# Patient Record
Sex: Female | Born: 1965 | Race: Black or African American | Hispanic: No | Marital: Married | Smoking: Never smoker
Health system: Southern US, Community
[De-identification: ages and names within clinical notes are randomized; demographics above are authoritative.]

## PROBLEM LIST (undated history)

## (undated) DIAGNOSIS — G8929 Other chronic pain: Secondary | ICD-10-CM

## (undated) DIAGNOSIS — J302 Other seasonal allergic rhinitis: Secondary | ICD-10-CM

## (undated) DIAGNOSIS — E119 Type 2 diabetes mellitus without complications: Secondary | ICD-10-CM

## (undated) DIAGNOSIS — M1990 Unspecified osteoarthritis, unspecified site: Secondary | ICD-10-CM

## (undated) DIAGNOSIS — K219 Gastro-esophageal reflux disease without esophagitis: Secondary | ICD-10-CM

## (undated) DIAGNOSIS — E785 Hyperlipidemia, unspecified: Secondary | ICD-10-CM

## (undated) DIAGNOSIS — F329 Major depressive disorder, single episode, unspecified: Secondary | ICD-10-CM

## (undated) DIAGNOSIS — E559 Vitamin D deficiency, unspecified: Secondary | ICD-10-CM

## (undated) DIAGNOSIS — D649 Anemia, unspecified: Secondary | ICD-10-CM

## (undated) DIAGNOSIS — F32A Depression, unspecified: Secondary | ICD-10-CM

## (undated) DIAGNOSIS — I10 Essential (primary) hypertension: Secondary | ICD-10-CM

## (undated) DIAGNOSIS — G629 Polyneuropathy, unspecified: Secondary | ICD-10-CM

## (undated) DIAGNOSIS — M549 Dorsalgia, unspecified: Secondary | ICD-10-CM

## (undated) DIAGNOSIS — Z973 Presence of spectacles and contact lenses: Secondary | ICD-10-CM

## (undated) HISTORY — PX: HERNIA REPAIR: SHX51

## (undated) HISTORY — DX: Polyneuropathy, unspecified: G62.9

## (undated) HISTORY — DX: Depression, unspecified: F32.A

## (undated) HISTORY — DX: Hyperlipidemia, unspecified: E78.5

## (undated) HISTORY — DX: Type 2 diabetes mellitus without complications: E11.9

## (undated) HISTORY — PX: DILATION AND CURETTAGE OF UTERUS: SHX78

## (undated) HISTORY — PX: BACK SURGERY: SHX140

## (undated) HISTORY — PX: TUBAL LIGATION: SHX77

## (undated) HISTORY — DX: Vitamin D deficiency, unspecified: E55.9

## (undated) HISTORY — DX: Essential (primary) hypertension: I10

---
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
## 2006-06-10 ENCOUNTER — Encounter: Admission: RE | Admit: 2006-06-10 | Discharge: 2006-06-10 | Payer: Self-pay | Admitting: Family Medicine

## 2010-10-23 ENCOUNTER — Encounter: Payer: Self-pay | Admitting: Nurse Practitioner

## 2010-10-23 DIAGNOSIS — E785 Hyperlipidemia, unspecified: Secondary | ICD-10-CM

## 2010-10-23 DIAGNOSIS — E559 Vitamin D deficiency, unspecified: Secondary | ICD-10-CM | POA: Insufficient documentation

## 2010-10-23 DIAGNOSIS — E1159 Type 2 diabetes mellitus with other circulatory complications: Secondary | ICD-10-CM | POA: Insufficient documentation

## 2010-10-23 DIAGNOSIS — M722 Plantar fascial fibromatosis: Secondary | ICD-10-CM | POA: Insufficient documentation

## 2010-10-23 DIAGNOSIS — I10 Essential (primary) hypertension: Secondary | ICD-10-CM | POA: Insufficient documentation

## 2010-10-23 DIAGNOSIS — E1169 Type 2 diabetes mellitus with other specified complication: Secondary | ICD-10-CM | POA: Insufficient documentation

## 2010-12-07 ENCOUNTER — Encounter: Payer: Self-pay | Admitting: Family Medicine

## 2012-11-03 ENCOUNTER — Telehealth: Payer: Self-pay | Admitting: Nurse Practitioner

## 2012-11-03 NOTE — Telephone Encounter (Signed)
APPT MADE

## 2012-11-12 ENCOUNTER — Encounter: Payer: Self-pay | Admitting: Nurse Practitioner

## 2012-11-12 ENCOUNTER — Ambulatory Visit (INDEPENDENT_AMBULATORY_CARE_PROVIDER_SITE_OTHER): Payer: PRIVATE HEALTH INSURANCE | Admitting: Nurse Practitioner

## 2012-11-12 VITALS — BP 118/80 | HR 82 | Temp 97.8°F | Ht 64.0 in | Wt 222.0 lb

## 2012-11-12 DIAGNOSIS — M79643 Pain in unspecified hand: Secondary | ICD-10-CM

## 2012-11-12 DIAGNOSIS — M25549 Pain in joints of unspecified hand: Secondary | ICD-10-CM

## 2012-11-12 DIAGNOSIS — N926 Irregular menstruation, unspecified: Secondary | ICD-10-CM

## 2012-11-12 DIAGNOSIS — D62 Acute posthemorrhagic anemia: Secondary | ICD-10-CM

## 2012-11-12 DIAGNOSIS — N939 Abnormal uterine and vaginal bleeding, unspecified: Secondary | ICD-10-CM

## 2012-11-12 MED ORDER — MEDROXYPROGESTERONE ACETATE 10 MG PO TABS: 10.0000 mg | ORAL_TABLET | Freq: Every day | ORAL | Status: DC

## 2012-11-12 NOTE — Progress Notes (Signed)
  Subjective:    Patient ID: Tammy Mayo, female    DOB: [birth year], 47 y.o.   MRN: [id number]  Gynecologic Exam The patient's primary symptoms include vaginal bleeding. Primary symptoms comment: menses for 3 weeks. This is a new problem. The current episode started more than 1 month ago. The problem has been unchanged. The pain is mild. She is not pregnant. Associated symptoms include back pain. The vaginal discharge was bloody. The vaginal bleeding is heavier than menses. She has been passing clots. She has not been passing tissue. Nothing aggravates the symptoms. She has tried nothing for the symptoms. She is sexually active. No, her partner does not have an STD. She uses nothing (Use to be on BCP stooped due to high blood pressure and risk) for contraception. Her menstrual history has been irregular (Patient has been bleeding dialy fro at least 3 weeks.).  Bil hand pain Started about 6 months ago. Hard to grip things. Can't open a drink bottle. Patient has tried "arthritis Meds" OTC which helps sometimes.    Review of Systems  Constitutional: Negative.   Eyes: Negative.   Respiratory: Negative.   Cardiovascular: Negative.   Gastrointestinal: Negative.   Musculoskeletal: Positive for back pain.  Psychiatric/Behavioral: Negative.        Objective:   Physical Exam  Constitutional: She is oriented to person, place, and time. She appears well-developed and well-nourished.  Cardiovascular: Normal rate, normal heart sounds and intact distal pulses.   Pulmonary/Chest: Effort normal and breath sounds normal.  Abdominal: Soft. Bowel sounds are normal. She exhibits no mass. There is no tenderness. There is no rebound and no guarding.  Musculoskeletal:  FROM bil hands with pain on tight grip bil. (-)phalen bil (-)tinel bil  Neurological: She is alert and oriented to person, place, and time.  Skin: Skin is warm and dry.   BP 118/80  Pulse 82  Temp(Src) 97.8 F (36.6 C) (Oral)  Ht 5'  4" (1.626 m)  Wt 222 lb (100.699 kg)  BMI 38.09 kg/m2  LMP 10/22/2012  Results for orders placed in visit on 11/12/12  POCT HEMOGLOBIN      Result Value Range   Hemoglobin 10.8 (*) 12.2 - 16.2 g/dL          Assessment & Plan:  Abnormal uterine bleeding  Provera as RX  Bleeding may increase at first but should get better  If still bleeding in 2 weeks will do GYN referral Anemia  Iron tablet OTC daily  Recheck in 1 weeks   Mary-Margaret Daphine Deutscher, FNP

## 2012-11-12 NOTE — Patient Instructions (Signed)

## 2012-11-13 LAB — ARTHRITIS PANEL
Sed Rate: 12 mm/hr (ref 0–22)
Uric Acid, Serum: 6 mg/dL (ref 2.4–6.0)

## 2013-01-06 ENCOUNTER — Telehealth: Payer: Self-pay | Admitting: Family Medicine

## 2013-01-06 NOTE — Telephone Encounter (Signed)
I am not sure who is seeing Tammy Mayo her mother this call should be forwarded to that provider.

## 2013-01-07 NOTE — Telephone Encounter (Signed)
This pt sees Dr Modesto Charon

## 2015-03-08 ENCOUNTER — Other Ambulatory Visit: Payer: Self-pay | Admitting: Orthopedic Surgery

## 2015-03-08 DIAGNOSIS — M5441 Lumbago with sciatica, right side: Secondary | ICD-10-CM

## 2015-03-13 ENCOUNTER — Ambulatory Visit
Admission: RE | Admit: 2015-03-13 | Discharge: 2015-03-13 | Disposition: A | Discharge status: Home / Self Care | Payer: Worker's Compensation | Source: Ambulatory Visit | Attending: Orthopedic Surgery | Admitting: Orthopedic Surgery

## 2015-03-13 DIAGNOSIS — M5441 Lumbago with sciatica, right side: Secondary | ICD-10-CM

## 2015-03-13 MED ORDER — IOHEXOL 180 MG/ML  SOLN
1.0000 mL | Freq: Once | INTRAMUSCULAR | Status: DC | PRN
  Administered 2015-03-13: 1 mL via EPIDURAL

## 2015-03-13 MED ORDER — METHYLPREDNISOLONE ACETATE 40 MG/ML INJ SUSP (RADIOLOG
120.0000 mg | Freq: Once | INTRAMUSCULAR | Status: AC
  Administered 2015-03-13: 120 mg via EPIDURAL

## 2015-03-13 NOTE — Discharge Instructions (Signed)

## 2015-05-24 ENCOUNTER — Telehealth: Payer: Self-pay | Admitting: Family Medicine

## 2015-05-29 NOTE — Telephone Encounter (Signed)
Records were sent 05-25-15 by [facility]. Called Mattie @ [phone] and told her they should be receiving something in the mail.

## 2015-05-30 HISTORY — PX: BACK SURGERY: SHX140

## 2015-06-09 ENCOUNTER — Ambulatory Visit: Payer: Self-pay | Admitting: Physician Assistant

## 2015-06-16 ENCOUNTER — Inpatient Hospital Stay (HOSPITAL_COMMUNITY): Admission: RE | Admit: 2015-06-16 | Payer: Self-pay | Source: Ambulatory Visit

## 2015-06-19 ENCOUNTER — Ambulatory Visit: Payer: Self-pay | Admitting: Physician Assistant

## 2015-06-19 MED ORDER — DEXTROSE 5 % IV SOLN
2.0000 g | INTRAVENOUS | Status: AC
  Administered 2015-06-21 (×2): 2 g via INTRAVENOUS

## 2015-06-20 ENCOUNTER — Encounter (HOSPITAL_COMMUNITY): Payer: Self-pay

## 2015-06-20 ENCOUNTER — Encounter (HOSPITAL_COMMUNITY)
Admission: RE | Admit: 2015-06-20 | Discharge: 2015-06-20 | Disposition: A | Discharge status: Home / Self Care | Payer: BLUE CROSS/BLUE SHIELD | Source: Ambulatory Visit | Attending: Orthopedic Surgery | Admitting: Orthopedic Surgery

## 2015-06-20 HISTORY — DX: Presence of spectacles and contact lenses: Z97.3

## 2015-06-20 HISTORY — DX: Other seasonal allergic rhinitis: J30.2

## 2015-06-20 HISTORY — DX: Other chronic pain: G89.29

## 2015-06-20 HISTORY — DX: Unspecified osteoarthritis, unspecified site: M19.90

## 2015-06-20 HISTORY — DX: Gastro-esophageal reflux disease without esophagitis: K21.9

## 2015-06-20 HISTORY — DX: Dorsalgia, unspecified: M54.9

## 2015-06-20 LAB — CBC
HCT: 41.5 % (ref 36.0–46.0)
Hemoglobin: 14.1 g/dL (ref 12.0–15.0)
MCH: 30.3 pg (ref 26.0–34.0)
MCHC: 34 g/dL (ref 30.0–36.0)
MCV: 89.1 fL (ref 78.0–100.0)
PLATELETS: 307 10*3/uL (ref 150–400)
RBC: 4.66 MIL/uL (ref 3.87–5.11)
RDW: 12.7 % (ref 11.5–15.5)
WBC: 7.1 10*3/uL (ref 4.0–10.5)

## 2015-06-20 LAB — BASIC METABOLIC PANEL
Anion gap: 8 (ref 5–15)
BUN: 10 mg/dL (ref 6–20)
CHLORIDE: 104 mmol/L (ref 101–111)
CO2: 26 mmol/L (ref 22–32)
CREATININE: 0.86 mg/dL (ref 0.44–1.00)
Calcium: 9.7 mg/dL (ref 8.9–10.3)
Glucose, Bld: 92 mg/dL (ref 65–99)
POTASSIUM: 3.6 mmol/L (ref 3.5–5.1)
SODIUM: 138 mmol/L (ref 135–145)

## 2015-06-20 LAB — SURGICAL PCR SCREEN
MRSA, PCR: NEGATIVE
Staphylococcus aureus: NEGATIVE

## 2015-06-20 LAB — HCG, SERUM, QUALITATIVE: Preg, Serum: NEGATIVE

## 2015-06-20 LAB — ABO/RH: ABO/RH(D): O NEG

## 2015-06-20 LAB — GLUCOSE, CAPILLARY: GLUCOSE-CAPILLARY: 128 mg/dL — AB (ref 65–99)

## 2015-06-20 MED ORDER — CEFAZOLIN SODIUM-DEXTROSE 2-3 GM-% IV SOLR
2.0000 g | INTRAVENOUS | Status: DC
  Filled 2015-06-20: qty 50

## 2015-06-20 NOTE — Progress Notes (Signed)
Pt denies SOB, chest pain, and being under the care of a cardiologist. Pt denies having a stress test, echo and cardiac cath. Pt denies having a chest x ray and EKG within the last year. Pt last labs were 04/06/15 at PCP, Dr. Samuel Jesterynthia Butler, DO. Revonda StandardAllison, [place], anesthesia, made aware of pre-op consult.

## 2015-06-20 NOTE — Progress Notes (Signed)
Anesthesia Note: Patient is a 49 year old female scheduled for TLIF L4-5 on 06/21/15 by Dr. Shon BatonBrooks.  History includes HTN, DM2, never smoker, HLD, umbilical hernia repair. BMI consistent with obesity. PCP is Dr. Charm BargesButler who signed a note of medical clearance recommending close monitoring of her blood sugars.   Meds include dapagliflozin-metformin, Prempro, HCTZ, lisinopril, Provera, Mobic, pyridoxine, Crestor, tramadol.   06/20/15 EKG: NSR, low voltage QRS, non-specific T wave abnormality. Q wave in III.  Baseline artifact worse in I, aVL, III, aVF. No EKG at Dr. Silvana NewnessButler's office. Denied any prior cardiac testing.   Preoperative labs noted. A1C pending. T&S and nasal PCR in process.  Patient denies SOB, CP, edema, syncope. Reports CBG 94-191, typically fasting ~ 130-140's. Activity limited by back and RLE pain since 10/24/14 (lifting injury). Prior to that she was not limited by activity--was able to clean, walk up stairs, shop, etc.   Exam shows a pleasant black female in NAD. Heart RRR, no murmur noted. No carotid bruits noted. Lungs clear. No pre-tibial edema noted.  If no acute changes then I would anticipate that she can proceed as planned.  Tammy Ochsllison Illias Pantano, PA-C [facility]hesiology Phone [phone] 06/20/2015 2:53 PM

## 2015-06-20 NOTE — Pre-Procedure Instructions (Signed)
KILEA MCCAREY  06/20/2015      [facility] - [place], [place] - [address] [place] [address] Phone: [phone] Fax: [phone]    Your procedure is scheduled on Wednesday, June 21, 2015  Report to [facility] Admitting at 10:15 A.M.  Call this number if you have problems the morning of surgery:  [phone]   Remember:  Do not eat food or drink liquids after midnight.  Take these medicines the morning of surgery with A SIP OF WATER : estrogen, conjugated,-medroxyprogesterone (PREMPRO),  if needed: traMADol (ULTRAM) for pain, nasal spray for congestion,  Do not take oral diabetes medicines ( pills ) on the morning of surgery such as Dapagliflozin-Metformin HCl ER (XIGDUO XR)  Stop taking Aspirin, vitamins, fish oil, and herbal medications. Do not take any NSAIDs ie: Ibuprofen, Advil, Naproxen or any medication containing Aspirin such as meloxicam (MOBIC); stop now. How to Manage Your Diabetes Before Surgery Why is it important to control my blood sugar before and after surgery?   Improving blood sugar levels before and after surgery helps healing and can limit problems.  A way of improving blood sugar control is eating a healthy diet by:  - Eating less sugar and carbohydrates  - Increasing activity/exercise  - Talk with your doctor about reaching your blood sugar goals  High blood sugars (greater than 180 mg/dL) can raise your risk of infections and slow down your recovery so you will need to focus on controlling your diabetes during the weeks before surgery.  Make sure that the doctor who takes care of your diabetes knows about your planned surgery including the date and location.  How do I manage my blood sugars before surgery?   Check your blood sugar at least 4 times a day, 2 days before surgery to make sure that they are not too high or low.   Check your blood sugar the morning of your surgery when you wake up  and every 2 hours until you get to the Short-Stay unit.  If your blood sugar is less than 70 mg/dL, you will need to treat for low blood sugar by:  Treat a low blood sugar (less than 70 mg/dL) with 1/2 cup of clear juice (cranberry or apple), 4 glucose tablets, OR glucose gel.  Recheck blood sugar in 15 minutes after treatment (to make sure it is greater than 70 mg/dL).  If blood sugar is not greater than 70 mg/dL on re-check, call [phone] for further instructions.   Report your blood sugar to the Short-Stay nurse when you get to Short-Stay.  References:  [facility], 2007 "How to Manage your Diabetes Before and After Surgery".  Do not wear jewelry, make-up or nail polish.  Do not wear lotions, powders, or perfumes.  You may not wear deodorant.  Do not shave 48 hours prior to surgery.    Do not bring valuables to the hospital.  [facility] is not responsible for any belongings or valuables.  Contacts, dentures or bridgework may not be worn into surgery.  Leave your suitcase in the car.  After surgery it may be brought to your room.  For patients admitted to the hospital, discharge time will be determined by your treatment team.  Patients discharged the day of surgery will not be allowed to drive home.   Name and phone number of your driver:  Special instructions: Shower the night before surgery and the  morning of surgery with CHG.  Please read over the following fact sheets that you were given. Pain Booklet, Coughing and Deep Breathing, Blood Transfusion Information, MRSA Information and Surgical Site Infection Prevention

## 2015-06-21 ENCOUNTER — Inpatient Hospital Stay (HOSPITAL_COMMUNITY): Payer: BLUE CROSS/BLUE SHIELD

## 2015-06-21 ENCOUNTER — Inpatient Hospital Stay (HOSPITAL_COMMUNITY)
Admission: RE | Admit: 2015-06-21 | Discharge: 2015-06-23 | DRG: 460 | Disposition: A | Discharge status: Home / Self Care | Payer: BLUE CROSS/BLUE SHIELD | Source: Ambulatory Visit | Attending: Orthopedic Surgery | Admitting: Orthopedic Surgery

## 2015-06-21 ENCOUNTER — Encounter (HOSPITAL_COMMUNITY)
Admission: RE | Disposition: A | Discharge status: Home / Self Care | Payer: Self-pay | Source: Ambulatory Visit | Attending: Orthopedic Surgery

## 2015-06-21 ENCOUNTER — Encounter (HOSPITAL_COMMUNITY): Payer: Self-pay | Admitting: Critical Care Medicine

## 2015-06-21 DIAGNOSIS — E119 Type 2 diabetes mellitus without complications: Secondary | ICD-10-CM | POA: Diagnosis present

## 2015-06-21 DIAGNOSIS — E669 Obesity, unspecified: Secondary | ICD-10-CM | POA: Diagnosis present

## 2015-06-21 DIAGNOSIS — Z7984 Long term (current) use of oral hypoglycemic drugs: Secondary | ICD-10-CM | POA: Diagnosis not present

## 2015-06-21 DIAGNOSIS — J9811 Atelectasis: Secondary | ICD-10-CM | POA: Diagnosis not present

## 2015-06-21 DIAGNOSIS — M4316 Spondylolisthesis, lumbar region: Secondary | ICD-10-CM | POA: Diagnosis present

## 2015-06-21 DIAGNOSIS — R509 Fever, unspecified: Secondary | ICD-10-CM

## 2015-06-21 DIAGNOSIS — Z6834 Body mass index (BMI) 34.0-34.9, adult: Secondary | ICD-10-CM | POA: Diagnosis not present

## 2015-06-21 DIAGNOSIS — M5441 Lumbago with sciatica, right side: Secondary | ICD-10-CM | POA: Diagnosis present

## 2015-06-21 DIAGNOSIS — M549 Dorsalgia, unspecified: Secondary | ICD-10-CM | POA: Diagnosis present

## 2015-06-21 DIAGNOSIS — Z419 Encounter for procedure for purposes other than remedying health state, unspecified: Secondary | ICD-10-CM

## 2015-06-21 DIAGNOSIS — Z9889 Other specified postprocedural states: Secondary | ICD-10-CM

## 2015-06-21 LAB — GLUCOSE, CAPILLARY
GLUCOSE-CAPILLARY: 78 mg/dL (ref 65–99)
GLUCOSE-CAPILLARY: 76 mg/dL (ref 65–99)
Glucose-Capillary: 102 mg/dL — ABNORMAL HIGH (ref 65–99)
Glucose-Capillary: 184 mg/dL — ABNORMAL HIGH (ref 65–99)

## 2015-06-21 LAB — HEMOGLOBIN A1C
Hgb A1c MFr Bld: 7.4 % — ABNORMAL HIGH (ref 4.8–5.6)
MEAN PLASMA GLUCOSE: 166 mg/dL

## 2015-06-21 LAB — TYPE AND SCREEN
ABO/RH(D): O NEG
ANTIBODY SCREEN: NEGATIVE
Weak D: POSITIVE

## 2015-06-21 SURGERY — POSTERIOR LUMBAR FUSION 1 LEVEL
Anesthesia: General

## 2015-06-21 MED ORDER — OXYCODONE-ACETAMINOPHEN 10-325 MG PO TABS: 1.0000 | ORAL_TABLET | ORAL | Status: DC | PRN

## 2015-06-21 MED ORDER — ACETAMINOPHEN 10 MG/ML IV SOLN
INTRAVENOUS | Status: DC | PRN
  Administered 2015-06-21: 1000 mg via INTRAVENOUS

## 2015-06-21 MED ORDER — MAGNESIUM CITRATE PO SOLN
0.5000 | Freq: Once | ORAL | Status: AC
  Administered 2015-06-22: 0.5 via ORAL
  Filled 2015-06-21: qty 296

## 2015-06-21 MED ORDER — LACTATED RINGERS IV SOLN
INTRAVENOUS | Status: DC
  Administered 2015-06-21: 18:00:00 via INTRAVENOUS

## 2015-06-21 MED ORDER — MIDAZOLAM HCL 5 MG/5ML IJ SOLN
INTRAMUSCULAR | Status: DC | PRN
  Administered 2015-06-21: 2 mg via INTRAVENOUS

## 2015-06-21 MED ORDER — DAPAGLIFLOZIN PRO-METFORMIN ER 10-1000 MG PO TB24: 1.0000 | ORAL_TABLET | Freq: Every day | ORAL | Status: DC

## 2015-06-21 MED ORDER — SODIUM CHLORIDE 0.9 % IJ SOLN
3.0000 mL | Freq: Two times a day (BID) | INTRAMUSCULAR | Status: DC
  Administered 2015-06-21 – 2015-06-23 (×3): 3 mL via INTRAVENOUS

## 2015-06-21 MED ORDER — FENTANYL CITRATE (PF) 250 MCG/5ML IJ SOLN
INTRAMUSCULAR | Status: AC
  Filled 2015-06-21: qty 5

## 2015-06-21 MED ORDER — INSULIN ASPART 100 UNIT/ML ~~LOC~~ SOLN
0.0000 [IU] | Freq: Three times a day (TID) | SUBCUTANEOUS | Status: DC
Start: 2015-06-22 — End: 2015-06-23
  Administered 2015-06-22 (×2): 3 [IU] via SUBCUTANEOUS
  Administered 2015-06-22: 5 [IU] via SUBCUTANEOUS
  Administered 2015-06-23: 2 [IU] via SUBCUTANEOUS

## 2015-06-21 MED ORDER — LACTATED RINGERS IV SOLN
INTRAVENOUS | Status: DC
  Administered 2015-06-21 (×4): via INTRAVENOUS

## 2015-06-21 MED ORDER — ARTIFICIAL TEARS OP OINT
TOPICAL_OINTMENT | OPHTHALMIC | Status: AC
  Filled 2015-06-21: qty 3.5

## 2015-06-21 MED ORDER — PROMETHAZINE HCL 25 MG/ML IJ SOLN: 6.2500 mg | INTRAMUSCULAR | Status: DC | PRN

## 2015-06-21 MED ORDER — METHOCARBAMOL 500 MG PO TABS: 500.0000 mg | ORAL_TABLET | Freq: Four times a day (QID) | ORAL | Status: DC | PRN

## 2015-06-21 MED ORDER — BUPIVACAINE-EPINEPHRINE (PF) 0.25% -1:200000 IJ SOLN
INTRAMUSCULAR | Status: AC
  Filled 2015-06-21: qty 30

## 2015-06-21 MED ORDER — PROPOFOL 10 MG/ML IV BOLUS
INTRAVENOUS | Status: AC
  Filled 2015-06-21: qty 20

## 2015-06-21 MED ORDER — PROPOFOL 500 MG/50ML IV EMUL
INTRAVENOUS | Status: DC | PRN
  Administered 2015-06-21: 14:00:00 via INTRAVENOUS
  Administered 2015-06-21: 50 ug/kg/min via INTRAVENOUS

## 2015-06-21 MED ORDER — SODIUM CHLORIDE 0.9 % IJ SOLN: 3.0000 mL | INTRAMUSCULAR | Status: DC | PRN

## 2015-06-21 MED ORDER — DEXAMETHASONE SODIUM PHOSPHATE 4 MG/ML IJ SOLN
INTRAMUSCULAR | Status: DC | PRN
  Administered 2015-06-21: 4 mg via INTRAVENOUS

## 2015-06-21 MED ORDER — LIDOCAINE HCL (CARDIAC) 20 MG/ML IV SOLN
INTRAVENOUS | Status: DC | PRN
  Administered 2015-06-21: 70 mg via INTRAVENOUS

## 2015-06-21 MED ORDER — MIDAZOLAM HCL 2 MG/2ML IJ SOLN
INTRAMUSCULAR | Status: AC
  Filled 2015-06-21: qty 2

## 2015-06-21 MED ORDER — CONJ ESTROG-MEDROXYPROGEST ACE 0.625-2.5 MG PO TABS
1.0000 | ORAL_TABLET | Freq: Every day | ORAL | Status: DC
Start: 2015-06-21 — End: 2015-06-23

## 2015-06-21 MED ORDER — LACTATED RINGERS IV SOLN: INTRAVENOUS | Status: DC

## 2015-06-21 MED ORDER — MENTHOL 3 MG MT LOZG: 1.0000 | LOZENGE | OROMUCOSAL | Status: DC | PRN

## 2015-06-21 MED ORDER — ACETAMINOPHEN 10 MG/ML IV SOLN
INTRAVENOUS | Status: AC
  Filled 2015-06-21: qty 100

## 2015-06-21 MED ORDER — ONDANSETRON HCL 4 MG/2ML IJ SOLN
4.0000 mg | INTRAMUSCULAR | Status: DC | PRN
  Administered 2015-06-23: 4 mg via INTRAVENOUS
  Filled 2015-06-21: qty 2

## 2015-06-21 MED ORDER — CEFAZOLIN SODIUM-DEXTROSE 2-3 GM-% IV SOLR
2.0000 g | Freq: Three times a day (TID) | INTRAVENOUS | Status: AC
  Administered 2015-06-21 – 2015-06-22 (×2): 2 g via INTRAVENOUS
  Filled 2015-06-21 (×2): qty 50

## 2015-06-21 MED ORDER — OXYMETAZOLINE HCL 0.05 % NA SOLN: 1.0000 | Freq: Two times a day (BID) | NASAL | Status: DC | PRN

## 2015-06-21 MED ORDER — METHOCARBAMOL 1000 MG/10ML IJ SOLN: 500.0000 mg | Freq: Four times a day (QID) | INTRAVENOUS | Status: DC | PRN

## 2015-06-21 MED ORDER — ARTIFICIAL TEARS OP OINT
TOPICAL_OINTMENT | OPHTHALMIC | Status: DC | PRN
  Administered 2015-06-21: 1 via OPHTHALMIC

## 2015-06-21 MED ORDER — MEPERIDINE HCL 25 MG/ML IJ SOLN: 6.2500 mg | INTRAMUSCULAR | Status: DC | PRN

## 2015-06-21 MED ORDER — FENTANYL CITRATE (PF) 250 MCG/5ML IJ SOLN
INTRAMUSCULAR | Status: DC | PRN
  Administered 2015-06-21 (×2): 50 ug via INTRAVENOUS
  Administered 2015-06-21: 100 ug via INTRAVENOUS
  Administered 2015-06-21 (×6): 50 ug via INTRAVENOUS

## 2015-06-21 MED ORDER — PHENYLEPHRINE HCL 10 MG/ML IJ SOLN
INTRAMUSCULAR | Status: DC | PRN
  Administered 2015-06-21 (×4): 80 ug via INTRAVENOUS

## 2015-06-21 MED ORDER — SODIUM CHLORIDE 0.9 % IV SOLN: 250.0000 mL | INTRAVENOUS | Status: DC

## 2015-06-21 MED ORDER — LISINOPRIL 20 MG PO TABS
40.0000 mg | ORAL_TABLET | Freq: Every day | ORAL | Status: DC
  Administered 2015-06-21 – 2015-06-23 (×2): 40 mg via ORAL
  Filled 2015-06-21 (×2): qty 2
  Filled 2015-06-21: qty 4

## 2015-06-21 MED ORDER — INSULIN ASPART 100 UNIT/ML ~~LOC~~ SOLN: 0.0000 [IU] | SUBCUTANEOUS | Status: DC

## 2015-06-21 MED ORDER — PHENOL 1.4 % MT LIQD: 1.0000 | OROMUCOSAL | Status: DC | PRN

## 2015-06-21 MED ORDER — ROSUVASTATIN CALCIUM 10 MG PO TABS
10.0000 mg | ORAL_TABLET | Freq: Every day | ORAL | Status: DC
  Administered 2015-06-21 – 2015-06-23 (×3): 10 mg via ORAL
  Filled 2015-06-21 (×3): qty 1

## 2015-06-21 MED ORDER — HYDROMORPHONE HCL 1 MG/ML IJ SOLN
INTRAMUSCULAR | Status: AC
  Filled 2015-06-21: qty 1

## 2015-06-21 MED ORDER — ONDANSETRON HCL 4 MG PO TABS: 4.0000 mg | ORAL_TABLET | Freq: Three times a day (TID) | ORAL | Status: DC | PRN

## 2015-06-21 MED ORDER — HEMOSTATIC AGENTS (NO CHARGE) OPTIME
TOPICAL | Status: DC | PRN
  Administered 2015-06-21: 1 via TOPICAL

## 2015-06-21 MED ORDER — FLEET ENEMA 7-19 GM/118ML RE ENEM: 1.0000 | ENEMA | Freq: Once | RECTAL | Status: DC

## 2015-06-21 MED ORDER — SURGIFOAM 100 EX MISC
CUTANEOUS | Status: DC | PRN
  Administered 2015-06-21: 20000 mL via TOPICAL

## 2015-06-21 MED ORDER — ONDANSETRON HCL 4 MG/2ML IJ SOLN
INTRAMUSCULAR | Status: DC | PRN
  Administered 2015-06-21: 4 mg via INTRAVENOUS

## 2015-06-21 MED ORDER — PROPOFOL 10 MG/ML IV BOLUS
INTRAVENOUS | Status: DC | PRN
  Administered 2015-06-21: 100 mg via INTRAVENOUS

## 2015-06-21 MED ORDER — LIDOCAINE HCL (CARDIAC) 20 MG/ML IV SOLN
INTRAVENOUS | Status: AC
  Filled 2015-06-21: qty 5

## 2015-06-21 MED ORDER — HYDROMORPHONE HCL 1 MG/ML IJ SOLN
0.2500 mg | INTRAMUSCULAR | Status: DC | PRN
  Administered 2015-06-21 (×2): 0.5 mg via INTRAVENOUS

## 2015-06-21 MED ORDER — DEXAMETHASONE SODIUM PHOSPHATE 4 MG/ML IJ SOLN
INTRAMUSCULAR | Status: AC
  Filled 2015-06-21: qty 1

## 2015-06-21 MED ORDER — 0.9 % SODIUM CHLORIDE (POUR BTL) OPTIME
TOPICAL | Status: DC | PRN
  Administered 2015-06-21 (×2): 1000 mL

## 2015-06-21 MED ORDER — METHOCARBAMOL 500 MG PO TABS: 500.0000 mg | ORAL_TABLET | Freq: Three times a day (TID) | ORAL | Status: DC | PRN

## 2015-06-21 MED ORDER — MORPHINE SULFATE (PF) 2 MG/ML IV SOLN
1.0000 mg | INTRAVENOUS | Status: DC | PRN
  Administered 2015-06-21 – 2015-06-22 (×6): 2 mg via INTRAVENOUS
  Filled 2015-06-21 (×7): qty 1

## 2015-06-21 MED ORDER — BUPIVACAINE-EPINEPHRINE 0.25% -1:200000 IJ SOLN
INTRAMUSCULAR | Status: DC | PRN
Start: 2015-06-21 — End: 2015-06-21
  Administered 2015-06-21: 10 mL

## 2015-06-21 MED ORDER — PHENYLEPHRINE 40 MCG/ML (10ML) SYRINGE FOR IV PUSH (FOR BLOOD PRESSURE SUPPORT)
PREFILLED_SYRINGE | INTRAVENOUS | Status: AC
  Filled 2015-06-21: qty 10

## 2015-06-21 MED ORDER — HYDROCHLOROTHIAZIDE 12.5 MG PO CAPS
12.5000 mg | ORAL_CAPSULE | Freq: Every day | ORAL | Status: DC
  Administered 2015-06-21 – 2015-06-23 (×2): 12.5 mg via ORAL
  Filled 2015-06-21 (×3): qty 1

## 2015-06-21 MED ORDER — THROMBIN 20000 UNITS EX SOLR
CUTANEOUS | Status: AC
  Filled 2015-06-21: qty 20000

## 2015-06-21 SURGICAL SUPPLY — 83 items
BLADE SURG ROTATE 9660 (MISCELLANEOUS) IMPLANT
BUR EGG ELITE 4.0 (BURR) IMPLANT
BUR EGG ELITE 4.0MM (BURR)
CLIP NEUROVISION LG (CLIP) ×2 IMPLANT
CLOSURE STERI-STRIP 1/2X4 (GAUZE/BANDAGES/DRESSINGS) ×1
CLOSURE WOUND 1/2 X4 (GAUZE/BANDAGES/DRESSINGS) ×1
CLSR STERI-STRIP ANTIMIC 1/2X4 (GAUZE/BANDAGES/DRESSINGS) ×2 IMPLANT
COVER SURGICAL LIGHT HANDLE (MISCELLANEOUS) ×3 IMPLANT
DRAPE C-ARM 42X72 X-RAY (DRAPES) ×3 IMPLANT
DRAPE C-ARMOR (DRAPES) ×3 IMPLANT
DRAPE POUCH INSTRU U-SHP 10X18 (DRAPES) ×3 IMPLANT
DRAPE SURG 17X23 STRL (DRAPES) ×3 IMPLANT
DRAPE U-SHAPE 47X51 STRL (DRAPES) ×3 IMPLANT
DRSG MEPILEX BORDER 4X8 (GAUZE/BANDAGES/DRESSINGS) ×3 IMPLANT
DURAPREP 26ML APPLICATOR (WOUND CARE) ×3 IMPLANT
ELECT BLADE 4.0 EZ CLEAN MEGAD (MISCELLANEOUS) ×3
ELECT BLADE 6.5 EXT (BLADE) IMPLANT
ELECT PENCIL ROCKER SW 15FT (MISCELLANEOUS) ×3 IMPLANT
ELECT REM PT RETURN 9FT ADLT (ELECTROSURGICAL) ×3
ELECTRODE BLDE 4.0 EZ CLN MEGD (MISCELLANEOUS) ×1 IMPLANT
ELECTRODE REM PT RTRN 9FT ADLT (ELECTROSURGICAL) ×1 IMPLANT
GLOVE BIO SURGEON STRL SZ 6.5 (GLOVE) ×3 IMPLANT
GLOVE BIO SURGEONS STRL SZ 6.5 (GLOVE) ×3
GLOVE BIOGEL M 6.5 STRL (GLOVE) ×8 IMPLANT
GLOVE BIOGEL PI IND STRL 8.5 (GLOVE) ×1 IMPLANT
GLOVE BIOGEL PI INDICATOR 8.5 (GLOVE) ×2
GLOVE SS BIOGEL STRL SZ 8.5 (GLOVE) ×1 IMPLANT
GLOVE SUPERSENSE BIOGEL SZ 8.5 (GLOVE) ×2
GOWN STRL REUS W/ TWL LRG LVL3 (GOWN DISPOSABLE) ×1 IMPLANT
GOWN STRL REUS W/TWL 2XL LVL3 (GOWN DISPOSABLE) ×8 IMPLANT
GOWN STRL REUS W/TWL LRG LVL3 (GOWN DISPOSABLE) ×3
KIT BASIN OR (CUSTOM PROCEDURE TRAY) ×3 IMPLANT
KIT NDL NVM5 EMG ELECT (KITS) IMPLANT
KIT NEEDLE NVM5 EMG ELECT (KITS) ×1 IMPLANT
KIT NEEDLE NVM5 EMG ELECTRODE (KITS) ×2
KIT POSITION SURG JACKSON T1 (MISCELLANEOUS) IMPLANT
KIT ROOM TURNOVER OR (KITS) ×3 IMPLANT
LIGHT SOURCE ANGLE TIP STR 7FT (MISCELLANEOUS) ×2 IMPLANT
NDL I-PASS III (NEEDLE) IMPLANT
NDL SPNL 18GX3.5 QUINCKE PK (NEEDLE) ×2 IMPLANT
NEEDLE 22X1 1/2 (OR ONLY) (NEEDLE) ×3 IMPLANT
NEEDLE I-PASS III (NEEDLE) ×3 IMPLANT
NEEDLE SPNL 18GX3.5 QUINCKE PK (NEEDLE) ×6 IMPLANT
NEEDLE SSEP/EMG (NEEDLE) ×2 IMPLANT
NS IRRIG 1000ML POUR BTL (IV SOLUTION) ×3 IMPLANT
NVM5 NEEDLE MODULE ×2 IMPLANT
PACK LAMINECTOMY ORTHO (CUSTOM PROCEDURE TRAY) ×3 IMPLANT
PACK UNIVERSAL I (CUSTOM PROCEDURE TRAY) ×3 IMPLANT
PAD ARMBOARD 7.5X6 YLW CONV (MISCELLANEOUS) ×6 IMPLANT
PATTIES SURGICAL .5 X.5 (GAUZE/BANDAGES/DRESSINGS) IMPLANT
PATTIES SURGICAL .5 X1 (DISPOSABLE) ×3 IMPLANT
POSITIONER HEAD PRONE TRACH (MISCELLANEOUS) ×3 IMPLANT
PUTTY DBX 1CC (Putty) ×3 IMPLANT
PUTTY DBX 1CC DEPUY (Putty) IMPLANT
ROD RELINE MAS LORD 5.5X45MM (Rod) ×4 IMPLANT
SCREW LOCK RELINE 5.5 TULIP (Screw) ×8 IMPLANT
SCREW RED RELINE 7.5X45MM POLY (Screw) ×1 IMPLANT
SCREW RELINE MAS POLY 6.5X40MM (Screw) ×2 IMPLANT
SCREW RELINE RED 6.5X45MM POLY (Screw) ×2 IMPLANT
SCREW SHANK RELINE 6.5X45MM 2C (Screw) ×2 IMPLANT
SCREW SHANK RELINE MOD 7.5X45 (Screw) ×3 IMPLANT
SCREW SHANK RLINE MD 7.5X45 2C (Screw) IMPLANT
SHEET CONFORM 45LX20WX5H (Bone Implant) ×2 IMPLANT
SPINE TULIP RELINE MOD (Neuro Prosthesis/Implant) ×6 IMPLANT
SPONGE LAP 4X18 X RAY DECT (DISPOSABLE) ×8 IMPLANT
SPONGE SURGIFOAM ABS GEL 100 (HEMOSTASIS) ×3 IMPLANT
STRIP CLOSURE SKIN 1/2X4 (GAUZE/BANDAGES/DRESSINGS) ×1 IMPLANT
SURGIFLO W/THROMBIN 8M KIT (HEMOSTASIS) IMPLANT
SUT BONE WAX W31G (SUTURE) ×3 IMPLANT
SUT MNCRL AB 3-0 PS2 18 (SUTURE) ×6 IMPLANT
SUT VIC AB 1 CT1 18XCR BRD 8 (SUTURE) ×1 IMPLANT
SUT VIC AB 1 CT1 8-18 (SUTURE) ×3
SUT VIC AB 2-0 CT1 18 (SUTURE) ×3 IMPLANT
SUT VICRYL 0 UR6 27IN ABS (SUTURE) ×2 IMPLANT
SYR BULB IRRIGATION 50ML (SYRINGE) ×3 IMPLANT
SYR CONTROL 10ML LL (SYRINGE) ×3 IMPLANT
TLIF XLRG 11MM (Neuro Prosthesis/Implant) ×2 IMPLANT
TOWEL OR 17X24 6PK STRL BLUE (TOWEL DISPOSABLE) ×3 IMPLANT
TOWEL OR 17X26 10 PK STRL BLUE (TOWEL DISPOSABLE) ×3 IMPLANT
TRAY FOLEY CATH 16FRSI W/METER (SET/KITS/TRAYS/PACK) ×3 IMPLANT
TULIP SPINE RELINE MOD (Neuro Prosthesis/Implant) IMPLANT
WATER STERILE IRR 1000ML POUR (IV SOLUTION) ×3 IMPLANT
YANKAUER SUCT BULB TIP NO VENT (SUCTIONS) ×3 IMPLANT

## 2015-06-21 NOTE — Transfer of Care (Signed)
Immediate Anesthesia Transfer of Care Note  Patient: Tammy BucklerElizabeth A Porath  Procedure(s) Performed: Procedure(s): TLIF Lumbar four-fiive     POSTERIOR LUMBAR FUSION (1 LEVEL) (N/A)  Patient Location: PACU  Anesthesia Type:General  Level of Consciousness: awake, alert  and oriented  Airway & Oxygen Therapy: Patient Spontanous Breathing and Patient connected to nasal cannula oxygen  Post-op Assessment: Report given to RN, Post -op Vital signs reviewed and stable and Patient moving all extremities X 4  Post vital signs: Reviewed and stable  Last Vitals:  Filed Vitals:   06/21/15 1026  BP: 124/75  Pulse: 69  Temp: 36.6 C  Resp: 16    Complications: No apparent anesthesia complications

## 2015-06-21 NOTE — Progress Notes (Signed)
Patient admitted from PACU, patient alert and oriented x4. Patient oriented to room and made comfortable.

## 2015-06-21 NOTE — Anesthesia Procedure Notes (Signed)
Procedure Name: Intubation Date/Time: 06/21/2015 12:13 PM Performed by: Glo HerringLEE, Sherol Sabas B Pre-anesthesia Checklist: Patient identified, Timeout performed, Emergency Drugs available, Suction available and Patient being monitored Patient Re-evaluated:Patient Re-evaluated prior to inductionOxygen Delivery Method: Circle system utilized Preoxygenation: Pre-oxygenation with 100% oxygen Intubation Type: IV induction Ventilation: Mask ventilation without difficulty Laryngoscope Size: Mac and 3 Grade View: Grade I Tube size: 7.0 mm Number of attempts: 1 Airway Equipment and Method: Stylet Placement Confirmation: CO2 detector,  positive ETCO2,  ETT inserted through vocal cords under direct vision and breath sounds checked- equal and bilateral Secured at: 21 cm Tube secured with: Tape Dental Injury: Teeth and Oropharynx as per pre-operative assessment

## 2015-06-21 NOTE — H&P (Signed)
History of Present Illness The patient is a 49 year old female who comes in today for a preoperative History and Physical. The patient is scheduled for a lumbar TLIF to be performed by Dr. Debria Garret D. Shon Baton, MD at [facility] on 06-21-2015 . Please see the hospital record for complete dictated history and physical. Sever LBp and radicular pain in RLE to her toes. the pt has DM. She reports her last A1c was 7.1. the pt is only on oral medication. the pt is a nonsmoker.  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 Diabetes Mellitus Father, Mother. child First Degree Relatives reported Hypertension Brother, Father, Maternal Grandfather, Maternal Grandmother, Mother, Paternal Grandmother, Sister. Rheumatoid Arthritis Father, Mother.  Social History  Tobacco use Never smoker. 11/28/2014 Children 2 Current work status working full time Never consumed alcohol 11/28/2014: Never consumed alcohol No history of drug/alcohol rehab Not under pain contract Living situation live with spouse Marital status married Exercise Exercises never  Medication History  Meloxicam (7.5MG  Tablet, 1 (one) Oral PO BID, Taken starting 05/30/2015) Active. (DDB/RCY) TraMADol HCl (  Tablet, 1 (one) Oral two times daily, as needed, Taken starting 05/29/2015) Active. (prn) Xigduo XR (10-1000MG  Tablet ER 24HR, Oral) Active. (qd) DiphenhydrAMINE HCl (  Capsule, Oral) Active. (prn itching) Cold Medicine Plus (2-30-325MG  Capsule, Oral) Active. (prn) Biotin ( Tablet, Oral) Active. (qd) Vitamin D-3 (1000UNIT Capsule, Oral) Active. (qd) Lisinopril (  Tablet, Oral) Active. (qd) Hydrochlorothiazide (12.5MG  Tablet,  Oral) Active. (qd) Fluconazole (  Tablet, Oral) Active. (tid) Crestor (  Tablet, Oral) Active. (qd) Prempro (0.625-2.5MG  Tablet, Oral) Active. (qd) Magnesium (Oral) Specific dose unknown - Active. Medications Reconciled  Pregnancy / Birth History  Pregnant no  Past Surgical History Other Orthopaedic Surgery Tubal Ligation  Other Problems Diabetes Mellitus, Type II High blood pressure Hypercholesterolemia  Vitals  06/13/2015 3:05 PM Weight: 199 lb Height: 64in Body Surface Area: 1.95 m Body Mass Index: 34.16 kg/m  Pulse: 93 (Regular)  BP: 117/84 (Sitting, Left Arm, Standard)  Physical Exam  General General Appearance-Not in acute distress. Orientation-Oriented X3. Build & Nutrition-Well nourished and Well developed.  Integumentary General Characteristics Surgical Scars - no surgical scar evidence of previous lumbar surgery. Lumbar Spine-Skin examination of the lumbar spine is without deformity, skin lesions, lacerations or abrasions.  Chest and Lung Exam Auscultation Breath sounds - Normal and Clear.  Cardiovascular Auscultation Rhythm - Regular rate and rhythm.  Abdomen Palpation/Percussion Palpation and Percussion of the abdomen reveal - Soft, Non Tender and No Rebound tenderness.  Peripheral Vascular Lower Extremity Palpation - Posterior tibial pulse - Bilateral - 2+. Dorsalis pedis pulse - Bilateral - 2+.  Neurologic Sensation Lower Extremity - Bilateral - sensation is intact in the lower extremity. Reflexes Patellar Reflex - Bilateral - 2+. Achilles Reflex - Bilateral - 2+. Clonus - Bilateral - clonus not present. Hoffman's Sign - Bilateral - Hoffman's sign not present. Testing Seated Straight Leg Raise - Right - Seated straight leg raise positive.  Musculoskeletal Spine/Ribs/Pelvis  Lumbosacral Spine: Inspection and Palpation - Tenderness - right lumbar paraspinals tender to palpation and right buttock is tender  to palpation. Strength and Tone: Strength - Hip Flexion - Bilateral - 5/5. Knee Extension - Bilateral - 5/5. Knee Flexion - Bilateral - 5/5. Ankle Dorsiflexion - Bilateral - 5/5. Ankle Plantarflexion - Bilateral - 5/5. Heel walk -  Left - able to heel walk without difficulty. Right - unable to heel walk. Toe Walk - Left - able to walk on toes without difficulty. Right - unable to walk on toes. Heel-Toe Walk - Bilateral - able to heel-toe walk with moderate difficulty. ROM - Flexion - moderately decreased range of motion and painful. Extension - moderately decreased range of motion and painful. Left Lateral Bending - moderately decreased range of motion and painful. Right Lateral Bending - moderately decreased range of motion and painful. Right Rotation - moderately decreased range of motion and painful. Left Rotation - moderately decreased range of motion and painful. Pain - extension is more painful than flexion. Lumbosacral Spine - Waddell's Signs - no Waddell's signs present. Lower Extremity Range of Motion - No true hip, knee or ankle pain with range of motion. Gait and Station - Assistive DevicSafeway Inces - no assistive devices.   Posterior decompression/Fusion:Risks of surgery include infection, bleeding, nerve damage, death, stroke, paralysis, failure to heal, need for further surgery, ongoing or worse pain, need for further surgery, CSF leak, loss of bowel or bladder, and recurrent disc herniation or stenosis which would necessitate need for further surgery. Non-union, hardware failure, adjacent segment disease and recurrent pain. Hardware breakage, mal-position requiring surgery to correct or remove.  Goal Of Surgery:Discussed that goal of surgery is to reduce pain and improve function and quality of life. Patient is aware that despite all appropriate treatment that there pain and function could be the same, worse, or different.  At this point, I do not think further conservative care has a chance of  improving her situation. We discussed surgical intervention at this point given the failure of conservative treatment. I think it is reasonable to proceed with it. This would be a transforaminal lumbar interbody fusion at L4-5. This would allow for decompression of the right L4 and L5 nerve roots and stabilization. My hope is that by one year she would have a solid fusion at her MMI. I would think that somewhere around three months, she can begin light duties and gradual return to work. More than likely, she will need to do a work Product managerconditioning program and hopefully will be able to get to near complete full duties at around five to six months from surgery. The goals of surgery have been explained as are the risks, which include infection, bleeding, nerve damage, death, stroke, paralysis, failure to heal, need for further surgery, ongoing or worse pain, loss of bowel and bladder control, failure to get a fusion, hardware malposition. All of her questions were addressed.

## 2015-06-21 NOTE — Brief Op Note (Signed)
06/21/2015  4:11 PM  PATIENT:  Hassan BucklerElizabeth A Kroner  49 y.o. female  PRE-OPERATIVE DIAGNOSIS:  Lumbar four-five SLIP WITH RADICULAR RIGHT LEG PAIN   POST-OPERATIVE DIAGNOSIS:  Lumbar four-five SLIP WITH RADICULAR RIGHT LEG PAIN   PROCEDURE:  Procedure(s): TLIF Lumbar four-fiive     POSTERIOR LUMBAR FUSION (1 LEVEL) (N/A)  SURGEON:  Surgeon(s) and Role:    * Venita Lickahari Cavon Nicolls, MD - Primary  PHYSICIAN ASSISTANT:   ASSISTANTS: Carmen Mayo   ANESTHESIA:   general  EBL:  Total I/O In: 2000 [I.V.:2000] Out: 625 [Urine:175; Blood:450]  BLOOD ADMINISTERED:none  DRAINS: none   LOCAL MEDICATIONS USED:  MARCAINE     SPECIMEN:  No Specimen  DISPOSITION OF SPECIMEN:  N/A  COUNTS:  YES  TOURNIQUET:  * No tourniquets in log *  DICTATION: .Other Dictation: Dictation Number [id number]  PLAN OF CARE: Admit to inpatient   PATIENT DISPOSITION:  PACU - hemodynamically stable.

## 2015-06-21 NOTE — Op Note (Signed)
[place]:  Mayo, Tammy            ACCOUNT NO.:  [id number]  MEDICAL RECORD NO.:  [id number]  LOCATION:  5C10C                        FACILITY:  [facility]  PHYSICIAN:  Liboria Putnam D. Shon BatonBrooks, M.D. DATE OF BIRTH:  June 12, 1966  DATE OF PROCEDURE:  06/21/2015 DATE OF DISCHARGE:                              OPERATIVE REPORT   PREOPERATIVE DIAGNOSIS:  Degenerative spondylolisthesis L4-5 with radicular right leg pain.  POSTOPERATIVE DIAGNOSIS:  Degenerative spondylolisthesis L4-5 with radicular right leg pain.  OPERATIVE PROCEDURE:  Transforaminal lumbar interbody fusion, L4-5.  COMPLICATIONS:  None.  INSTRUMENTATION SYSTEM USED:  The NuVasive MIS pedicle screw system with 6.5 mm diameter, 45 mm length screws at L4 and 7.5 mm diameter 45 mm length screws at L5.  FIRST ASSISTANT:  Anette Riedelarmen Mayo.  COMPLICATIONS:  No complications.  The interbody device was a Titan titanium extra long size 11 cage packed with autograft and DBX.  HISTORY:  This is a very pleasant 49 year old woman who has been having debilitating back, buttock, and neuropathic right leg pain.  Attempts at conservative management have failed to alleviate her symptoms and her quality of life continued to deteriorate.  As a result, we elected to proceed with surgery.  All appropriate risks, benefits, and alternatives were discussed with the patient and consent was obtained.  OPERATIVE NOTE:  The patient was brought to the operating room, placed supine on the operating table.  After successful induction of general anesthesia and endotracheal intubation, TEDs, SCDs, and Foley were inserted.  The neuromonitoring representative placed all appropriate needles for intraoperative SSEP and EMG for monitoring.  She was turned prone onto the [place] frame.  All bony prominences were well padded and the back was prepped and draped in standard fashion.  Time-out was taken to confirm patient, procedure, and all other pertinent important  data. Once this was done on the left leg, on the left side of the back, I identified the lateral border of the L4 and L5 pedicles.  I marked this area, infiltrated the area with 0.25% Marcaine with epi and then made stab incisions over this area.  I advanced my Jamshidi needle down to the lateral aspect of the L4 pedicle.  I confirmed satisfactory position on the lateral side of the facet complex.  I then connected the Jamshidi needle for intraoperative monitoring and using AP fluoro and neuromonitoring, I advanced the Jamshidi needle into the pedicle.  Once I was nearing the medial wall of the pedicle on the AP view, I switched to the lateral, I confirmed that I was just beyond the posterior margin of the vertebral body.  In addition, there was no EMG activity to suggest pedicle breach.  I advanced the Jamshidi needle into the vertebral body.  I then placed the guide pin.  I repeated this exact same procedure at L5.  Once both pedicles were cannulated, I tapped and then placed the 45 mm length screws, a 6.5 and a 7.0 respectively.  Both screws had excellent purchase.  I then went to the right hand side.  I again identified the lateral border of both pedicles.  With this, I then made an incision and then performed a standard Wiltse approach to the spine.  I  dissected down to the deep fascia.  I incised this and then continued my dissection.  I identified the L4 transverse process and the L3-4 facet complex.  I placed a Jamshidi needle on the lateral prior to this and then confirmed satisfactory position in the AP plane.  I advanced the Jamshidi needle through the pedicle and into the vertebral body, again using the same technique, I had used on the contralateral side, I placed a guide pin and repeated this at L5.  Once both pedicles were cannulated, I tapped and placed the appropriate size screws which were attached to the retracting device.  I then established my retraction device, so I  could see the posterior lateral aspect of the spine. Medial retracting blade was placed.  I had excellent visualization.  I then identified the L4 lamina.  The posterior margin was significantly overgrown with osteophyte.  As such, I went and performed a small laminotomy of L3 and this allowed me to release the ligamentum flavum and identified the thecal sac.  I could then work post backwards resecting the leading edge of the L4 lamina.  This allowed me then to work into the lateral recess and identified the L4 nerve root. The pars itself was not competent.  At this point, I now had good visuals, I now had isolated the L4 inferior facet and I then used the osteotome to resect it.  Once this was resected, I could now visualize the posterior portion of the L4 lamina.  I then used my 3 mm Kerrison to complete my laminectomy starting from the inferior edge moving forward. Once the L4 laminectomy was complete, I now had perfect visualization of the posterolateral aspect of the thecal sac, the L3 nerve root in its entirety, the L4 pedicle and I could visualize the L5 nerve root and the L5 pedicle as well.  All nerve roots were adequately decompressed. There were engorged epidural veins which were coagulated with bipolar electrocautery.  At this point, I then protected the thecal sac with a nerve root retractor and then neuro patties to protect the exiting nerve roots.  I then incised the annulus and used a combination of pituitary rongeurs, curettes, and Kerrison rongeurs to resect all of the disk. Using the x-ray, I confirmed that I was not going too far anterior.  At this point, I had an excellent diskectomy.  I was scraping bone across the endplate.  I then irrigated copiously with normal saline and placed an allograft ConForM sheet along the anterior annulus.  I then obtained the size 11 extra long tightened cage which I packed with the bone that I had harvested from the decompression along  with some DBX.  I malleted this to the appropriate depth.  Once it was vertically down, I then kicked it across the midline in a horizontal position in the anterior 3rd of the disk space.  X-rays were satisfactory.  I could then palpate the back of the cage and visualize it, and was not contacting the thecal sac nor the nerve root.  At this point, I irrigated the wound copiously with normal saline.  I placed a thrombin-soaked Gelfoam over the exposed thecal sac.  Once I had hemostasis using bipolar electrocautery and FloSeal, I irrigated again and then placed my thrombin-soaked Gelfoam padding.  I then disconnected the retracting device, placed the heads on the screws and then inserted the rod.  This is a 45 mm length rod.  I placed my locking caps on and torqued  them off appropriately.  I then went back to the left hand side and passed my rod after measuring and then placed the same size rod.  I was able to visualize the inferior and posterior superior aspects of the construct and the rod was beyond the screw distally.  At this point, I irrigated all the wounds copiously with normal saline and then closed the deep fascia with interrupted #1 Vicryl sutures, superficial 2-0 Vicryl suture, and a 3-0 Monocryl for the skin.  Steri-Strips and dry dressing were applied.  The patient was ultimately extubated, transferred to PACU without incident.  At the end of the case, all needle and sponge counts were correct.     Mickie Kozikowski D. Shon Baton, M.D.     DDB/MEDQ  D:  06/21/2015  T:  06/21/2015  Job:  [id number]  cc:   Debria Garret D. Shon Baton, M.D.'s Office

## 2015-06-21 NOTE — Anesthesia Preprocedure Evaluation (Addendum)
Anesthesia Evaluation  Patient identified by MRN, date of birth, ID band Patient awake    Reviewed: Allergy & Precautions, NPO status , Patient's Chart, lab work & pertinent test results  Airway Mallampati: II  TM Distance: >3 FB Neck ROM: Full    Dental  (+) Dental Advisory Given, Teeth Intact   Pulmonary neg pulmonary ROS,    breath sounds clear to auscultation       Cardiovascular hypertension, Pt. on medications  Rhythm:Regular Rate:Normal     Neuro/Psych negative neurological ROS  negative psych ROS   GI/Hepatic Neg liver ROS, GERD  ,  Endo/Other  diabetes, Type 2, Oral Hypoglycemic AgentsMorbid obesity  Renal/GU negative Renal ROS  negative genitourinary   Musculoskeletal  (+) Arthritis ,   Abdominal   Peds negative pediatric ROS (+)  Hematology   Anesthesia Other Findings   Reproductive/Obstetrics negative OB ROS                           Lab Results  Component Value Date   WBC 7.1 06/20/2015   HGB 14.1 06/20/2015   HCT 41.5 06/20/2015   MCV 89.1 06/20/2015   PLT 307 06/20/2015   Lab Results  Component Value Date   CREATININE 0.86 06/20/2015   BUN 10 06/20/2015   NA 138 06/20/2015   K 3.6 06/20/2015   CL 104 06/20/2015   CO2 26 06/20/2015   No results found for: INR, PROTIME  EKG: normal sinus rhythm.   Anesthesia Physical Anesthesia Plan  ASA: II  Anesthesia Plan: General   Post-op Pain Management:    Induction: Intravenous  Airway Management Planned: Oral ETT  Additional Equipment:   Intra-op Plan:   Post-operative Plan: Extubation in OR  Informed Consent: I have reviewed the patients History and Physical, chart, labs and discussed the procedure including the risks, benefits and alternatives for the proposed anesthesia with the patient or authorized representative who has indicated his/her understanding and acceptance.   Dental advisory given  Plan  Discussed with: Anesthesiologist, Surgeon and CRNA  Anesthesia Plan Comments:        Anesthesia Quick Evaluation

## 2015-06-22 ENCOUNTER — Inpatient Hospital Stay (HOSPITAL_COMMUNITY): Payer: BLUE CROSS/BLUE SHIELD

## 2015-06-22 LAB — GLUCOSE, CAPILLARY
GLUCOSE-CAPILLARY: 112 mg/dL — AB (ref 65–99)
GLUCOSE-CAPILLARY: 159 mg/dL — AB (ref 65–99)
Glucose-Capillary: 223 mg/dL — ABNORMAL HIGH (ref 65–99)
Glucose-Capillary: 157 mg/dL — ABNORMAL HIGH (ref 65–99)

## 2015-06-22 MED ORDER — ACETAMINOPHEN 325 MG PO TABS
650.0000 mg | ORAL_TABLET | Freq: Four times a day (QID) | ORAL | Status: DC | PRN
  Administered 2015-06-22 – 2015-06-23 (×2): 650 mg via ORAL
  Filled 2015-06-22 (×2): qty 2

## 2015-06-22 NOTE — Progress Notes (Signed)
Patient up and ambulating in room to bathroom has had a bowel movement and is urinating pain is controled, husband at bedside. Will continue to monitor.

## 2015-06-22 NOTE — Progress Notes (Signed)
Occupational Therapy Evaluation Patient Details Name: Tammy Mayo MRN: [id number] DOB: 1965/08/15 Today's Date: 06/22/2015    History of Present Illness TLIF Lumbar four-fiive POSTERIOR LUMBAR FUSION (1 LEVEL)   Clinical Impression   PTA, pt independent with ADL and mobility. Began education regarding back precautions and compensatory techniques and use of AE/DME for ADL and functional mobility. Pt limited this am due to feeling "faint" during mobility  (only able to ambulate to bathroom due to not feeling well). BP 104/69 after returning to sitting. Will follow acutely to address established goals and facilitate safe D/C home with intermittent S.    Follow Up Recommendations  No OT follow up;Supervision - Intermittent    Equipment Recommendations  3 in 1 bedside comode;Other (comment) (RW)    Recommendations for Other Services       Precautions / Restrictions Precautions Precautions: Back Precaution Booklet Issued: Yes (comment) Required Braces or Orthoses: Spinal Brace Spinal Brace: Applied in sitting position;Lumbar corset      Mobility Bed Mobility               General bed mobility comments: Pt OOB in chair  Transfers Overall transfer level: Needs assistance Equipment used: Rolling walker (2 wheeled) Transfers: Sit to/from Stand Sit to Stand: Min assist         General transfer comment: vc to push up from chair and proper techniques. Good return demonstration (states RW helps)    Balance Overall balance assessment: Needs assistance   Sitting balance-Leahy Scale: Good       Standing balance-Leahy Scale: Fair                              ADL Overall ADL's : Needs assistance/impaired     Grooming: Set up;Supervision/safety   Upper Body Bathing: Supervision/ safety;Set up;Sitting   Lower Body Bathing: Moderate assistance;Sit to/from stand   Upper Body Dressing : Minimal assistance;Sitting Upper Body Dressing Details  (indicate cue type and reason): educated on how to donn/doff brace and how to tighten brace Lower Body Dressing: Moderate assistance;Sit to/from stand   Toilet Transfer: Minimal assistance;RW;BSC;Ambulation (over toilet)   Toileting- Clothing Manipulation and Hygiene: Moderate assistance;Sit to/from stand       Functional mobility during ADLs: Minimal assistance;Rolling walker;Cueing for safety (min A due to feeling "faint") General ADL Comments: Began education regarding compensatory techniques and use of AE and DME for ADL. Husband states they have a shower chair. Recommended pt use 3 in 1 in tub if if fits to give pt more support shen sitting/standing. Educated on doning/doffing brace as ptsittingup in chair without brace on entry to room. Educatedon porper positioning for sitting to decrease back pain. pt/husband verbalized understadning. written handout givne and reviewed.     Vision     Perception     Praxis      Pertinent Vitals/Pain Pain Assessment: 0-10 Pain Score: 6  Pain Location: back Pain Descriptors / Indicators: Aching Pain Intervention(s): Limited activity within patient's tolerance;Monitored during session;Repositioned     Hand Dominance Right   Extremity/Trunk Assessment Upper Extremity Assessment Upper Extremity Assessment: Overall WFL for tasks assessed   Lower Extremity Assessment Lower Extremity Assessment: Defer to PT evaluation   Cervical / Trunk Assessment Cervical / Trunk Assessment: Other exceptions (back fusion)   Communication Communication Communication: No difficulties   Cognition Arousal/Alertness: Awake/alert Behavior During Therapy: WFL for tasks assessed/performed Overall Cognitive Status: Within Functional Limits for tasks assessed  General Comments       Exercises       Shoulder Instructions      Home Living Family/patient expects to be discharged to:: Private residence Living Arrangements:  Spouse/significant other;Children Available Help at Discharge: Family;Available 24 hours/day Type of Home: House Home Access: Stairs to enter [employer] of Steps: 3-4   Home Layout: Able to live on main level with bedroom/bathroom     Bathroom Shower/Tub: Tub/shower unit Shower/tub characteristics: Engineer, building services: Standard Bathroom Accessibility: Yes How Accessible: Accessible via walker Home Equipment: Shower seat          Prior Functioning/Environment Level of Independence: Independent             OT Diagnosis: Generalized weakness;Acute pain   OT Problem List: Decreased activity tolerance;Decreased knowledge of use of DME or AE;Decreased knowledge of precautions;Obesity;Pain   OT Treatment/Interventions: Self-care/ADL training;DME and/or AE instruction;Therapeutic activities;Patient/family education    OT Goals(Current goals can be found in the care plan section) Acute Rehab OT Goals Patient Stated Goal: to return home OT Goal Formulation: With patient Time For Goal Achievement: 06/29/15 Potential to Achieve Goals: Good ADL Goals Pt Will Perform Lower Body Bathing: with set-up;with supervision;with adaptive equipment;with caregiver independent in assisting;sit to/from stand Pt Will Perform Lower Body Dressing: with set-up;with supervision;with adaptive equipment;with caregiver independent in assisting;sit to/from stand Pt Will Transfer to Toilet: with modified independence;bedside commode;ambulating Pt Will Perform Toileting - Clothing Manipulation and hygiene: with supervision;with caregiver independent in assisting;with adaptive equipment;sit to/from stand Pt Will Perform Tub/Shower Transfer: with min guard assist;with caregiver independent in assisting;3 in 1;ambulating;Tub transfer Additional ADL Goal #1: Pt/husband will be independent with donning/doffing lumbar corsett Additional ADL Goal #2: Pt will be independent with 3/3 back precuations  for ADL  OT Frequency: Min 2X/week   Barriers to D/C:            Co-evaluation              End of Session Equipment Utilized During Treatment: Gait belt;Rolling walker;Back brace Nurse Communication: Mobility status;Precautions  Activity Tolerance: Other (comment) (low BP) Patient left: in chair;with call bell/phone within reach;with family/visitor present   Time: 1022-1051 OT Time Calculation (min): 29 min Charges:  OT General Charges $OT Visit: 1 Procedure OT Evaluation $Initial OT Evaluation Tier I: 1 Procedure OT Treatments $Self Care/Home Management : 8-22 mins G-Codes:    Calyssa Zobrist,HILLARY Jul 19, 2015, 12:10 PM   [facility], OTR/L  [phone] 07-19-15

## 2015-06-22 NOTE — Evaluation (Signed)
Physical Therapy Evaluation Patient Details Name: Hassan Bucklerlizabeth A Reising MRN: [id number] DOB: May 10, 1966 Today's Date: 06/22/2015   History of Present Illness  TLIF Lumbar four-fiive POSTERIOR LUMBAR FUSION (1 LEVEL)  Clinical Impression  Pt admitted with above diagnosis and presents to PT with functional limitations due to deficits listed below (See PT problem list). Pt needs skilled PT to maximize independence and safety to allow discharge to home with husband.      Follow Up Recommendations No PT follow up    Equipment Recommendations  Rolling walker with 5" wheels (possibly)    Recommendations for Other Services       Precautions / Restrictions Precautions Precautions: Back Precaution Booklet Issued: Yes (comment) Required Braces or Orthoses: Spinal Brace Spinal Brace: Applied in sitting position;Lumbar corset      Mobility  Bed Mobility Overal bed mobility: Needs Assistance Bed Mobility: Sit to Sidelying         Sit to sidelying: Min assist General bed mobility comments: Assist to bring feet up into bed. Verbal cues for technique  Transfers Overall transfer level: Needs assistance Equipment used: Rolling walker (2 wheeled) Transfers: Sit to/from Stand Sit to Stand: Min assist         General transfer comment: Verbal cues for hand placement (states RW helps)  Ambulation/Gait Ambulation/Gait assistance: Min assist Ambulation Distance (Feet): 20 Feet Assistive device: Rolling walker (2 wheeled) Gait Pattern/deviations: Step-through pattern;Decreased stride length Gait velocity: decr Gait velocity interpretation: Below normal speed for age/gender General Gait Details: distance limited by feeling light headed (pt with low BP today)  Stairs            Wheelchair Mobility    Modified Rankin (Stroke Patients Only)       Balance Overall balance assessment: Needs assistance Sitting-balance support: No upper extremity supported;Feet  supported Sitting balance-Leahy Scale: Good     Standing balance support: No upper extremity supported;During functional activity Standing balance-Leahy Scale: Fair                               Pertinent Vitals/Pain Pain Assessment: Faces Pain Score: 6  Faces Pain Scale: Hurts even more Pain Location: back Pain Descriptors / Indicators: Grimacing;Guarding Pain Intervention(s): Limited activity within patient's tolerance;Monitored during session;Repositioned    Home Living Family/patient expects to be discharged to:: Private residence Living Arrangements: Spouse/significant other;Children Available Help at Discharge: Family;Available 24 hours/day Type of Home: House Home Access: Stairs to enter   [employer]ance Stairs-Number of Steps: 3-4 Home Layout: Able to live on main level with bedroom/bathroom Home Equipment: Shower seat      Prior Function Level of Independence: Independent               Hand Dominance   Dominant Hand: Right    Extremity/Trunk Assessment   Upper Extremity Assessment: Defer to OT evaluation           Lower Extremity Assessment: Overall WFL for tasks assessed      Cervical / Trunk Assessment: Other exceptions (back fusion)  Communication   Communication: No difficulties  Cognition Arousal/Alertness: Awake/alert Behavior During Therapy: WFL for tasks assessed/performed Overall Cognitive Status: Within Functional Limits for tasks assessed                      General Comments      Exercises        Assessment/Plan    PT Assessment Patient needs continued PT services  PT Diagnosis Difficulty walking;Acute pain   PT Problem List Decreased activity tolerance;Decreased mobility;Decreased knowledge of use of DME;Decreased knowledge of precautions;Pain  PT Treatment Interventions DME instruction;Gait training;Stair training;Functional mobility training;Therapeutic activities;Therapeutic exercise;Patient/family  education   PT Goals (Current goals can be found in the Care Plan section) Acute Rehab PT Goals Patient Stated Goal: to return home PT Goal Formulation: With patient Time For Goal Achievement: 06/29/15 Potential to Achieve Goals: Good    Frequency Min 5X/week   Barriers to discharge        Co-evaluation               End of Session Equipment Utilized During Treatment: Gait belt;Back brace Activity Tolerance: Treatment limited secondary to medical complications (Comment) (lightheaded) Patient left: in bed;with call bell/phone within reach;with bed alarm set;with family/visitor present           Time: 1205-1221 PT Time Calculation (min) (ACUTE ONLY): 16 min   Charges:   PT Evaluation $Initial PT Evaluation Tier I: 1 Procedure     PT G Codes:        Korry Dalgleish 23-Jun-2015, 12:56 PM [facility] PT [phone]

## 2015-06-22 NOTE — Progress Notes (Signed)
Subjective: 1 Day Post-Op Procedure(s) (LRB): TLIF Lumbar four-fiive     POSTERIOR LUMBAR FUSION (1 LEVEL) (N/A) Patient reports pain as moderate.  Controlled with oral pain meds.  No n/v/.  TOlerating regular diet.  No PT yet.  Objective: Vital signs in last 24 hours: Temp:  [97 F (36.1 C)-100.1 F (37.8 C)] 100.1 F (37.8 C) (11/24 0543) Pulse Rate:  [69-88] 88 (11/24 0543) Resp:  [13-19] 18 (11/24 0543) BP: (105-135)/(51-75) 105/51 mmHg (11/24 0543) SpO2:  [93 %-100 %] 99 % (11/24 0543) Weight:  [90.89 kg (200 lb 6 oz)] 90.89 kg (200 lb 6 oz) (11/23 1026)  Intake/Output from previous day: 11/23 0701 - 11/24 0700 In: 3000 [I.V.:3000] Out: 2925 [Urine:2475; Blood:450] Intake/Output this shift:     Recent Labs  06/20/15 1400  HGB 14.1    Recent Labs  06/20/15 1400  WBC 7.1  RBC 4.66  HCT 41.5  PLT 307    Recent Labs  06/20/15 1400  NA 138  K 3.6  CL 104  CO2 26  BUN 10  CREATININE 0.86  GLUCOSE 92  CALCIUM 9.7   No results for input(s): LABPT, INR in the last 72 hours.  PE:  wn wd woman in nad.  A and O x 4.  L spine wound dressed and dry.  NVI B LEs.  Assessment/Plan: 1 Day Post-Op Procedure(s) (LRB): TLIF Lumbar four-fiive     POSTERIOR LUMBAR FUSION (1 LEVEL) (N/A) Up with therapy  Hopefully home tomorrow after BM.  Toni ArthursHEWITT, Tamaka Sawin 06/22/2015, 9:08 AM

## 2015-06-22 NOTE — Anesthesia Postprocedure Evaluation (Signed)
Anesthesia Post Note  Patient: Tammy Mayo  Procedure(s) Performed: Procedure(s) (LRB): TLIF Lumbar four-fiive     POSTERIOR LUMBAR FUSION (1 LEVEL) (N/A)  Patient location during evaluation: PACU Anesthesia Type: General Level of consciousness: awake and alert Pain management: pain level controlled Vital Signs Assessment: post-procedure vital signs reviewed and stable Respiratory status: spontaneous breathing, nonlabored ventilation, respiratory function stable and patient connected to nasal cannula oxygen Cardiovascular status: blood pressure returned to baseline and stable Postop Assessment: No signs of nausea or vomiting Anesthetic complications: no    Last Vitals:  Filed Vitals:   06/22/15 0136 06/22/15 0543  BP: 120/57 105/51  Pulse: 85 88  Temp: 37.6 C 37.8 C  Resp: 18 18    Last Pain:  Filed Vitals:   06/22/15 0551  PainSc: Asleep    LLE Motor Response: Purposeful movement LLE Sensation: Full sensation RLE Motor Response: Purposeful movement RLE Sensation: Full sensation      Shelton SilvasKevin D [name]

## 2015-06-23 ENCOUNTER — Inpatient Hospital Stay (HOSPITAL_COMMUNITY): Payer: BLUE CROSS/BLUE SHIELD

## 2015-06-23 LAB — GLUCOSE, CAPILLARY
GLUCOSE-CAPILLARY: 103 mg/dL — AB (ref 65–99)
GLUCOSE-CAPILLARY: 136 mg/dL — AB (ref 65–99)
Glucose-Capillary: 104 mg/dL — ABNORMAL HIGH (ref 65–99)
Glucose-Capillary: 180 mg/dL — ABNORMAL HIGH (ref 65–99)

## 2015-06-23 MED ORDER — OXYCODONE-ACETAMINOPHEN 5-325 MG PO TABS
1.0000 | ORAL_TABLET | ORAL | Status: DC | PRN
  Administered 2015-06-23: 1 via ORAL
  Filled 2015-06-23: qty 1

## 2015-06-23 NOTE — Progress Notes (Signed)
Pt discharged per MD order. Pt and spouse educated on discharge instructions. Pt and spouse verbalized understanding of discharge instructions. IV removed by RN. All questions and concerns addressed by RN. Pt exited hospital via wheelchair.

## 2015-06-23 NOTE — Care Management Note (Signed)
Case Management Note  Patient Details  Name: Tammy Mayo MRN: [id number] Date of Birth: May 03, 1966  Subjective/Objective:                    Action/Plan: Patient with potential for discharge today depending on her CXR results. Patient with orders for rolling walker and 3 in 1. Tiffany with [facility] notified and will deliver the equipment to the room. Will update bedside RN.   Expected Discharge Date:                  Expected Discharge Plan:     In-House Referral:     Discharge planning Services     Post Acute Care Choice:    Choice offered to:     DME Arranged:    DME Agency:     HH Arranged:    HH Agency:     Status of Service:     Medicare Important Message Given:    Date Medicare IM Given:    Medicare IM give by:    Date Additional Medicare IM Given:    Additional Medicare Important Message give by:     If discussed at Long Length of Stay Meetings, dates discussed:    Additional Comments:  Kermit BaloKelli F Aoi Kouns, RN 06/23/2015, 11:05 AM

## 2015-06-23 NOTE — Progress Notes (Signed)
   Subjective:  Patient reports pain as mild to moderate.  Had BM yesterday. Denies urinary sx.  Objective:   VITALS:   Filed Vitals:   06/22/15 1848 06/22/15 2105 06/23/15 0144 06/23/15 0605  BP:  109/58 114/65 109/61  Pulse:  110 87 123  Temp: 99.5 F (37.5 C) 99 F (37.2 C) 99.9 F (37.7 C) 100.8 F (38.2 C)  TempSrc:  Oral Oral Oral  Resp:  18 18 18   Height:      Weight:      SpO2:  100% 97% 96%  Tm 102 @~5PM yesterday  ABD soft Sensation intact distally Intact pulses distally Dorsiflexion/Plantar flexion intact Incision: dressing C/D/I   Lab Results  Component Value Date   WBC 7.1 06/20/2015   HGB 14.1 06/20/2015   HCT 41.5 06/20/2015   MCV 89.1 06/20/2015   PLT 307 06/20/2015   BMET    Component Value Date/Time   NA 138 06/20/2015 1400   K 3.6 06/20/2015 1400   CL 104 06/20/2015 1400   CO2 26 06/20/2015 1400   GLUCOSE 92 06/20/2015 1400   BUN 10 06/20/2015 1400   CREATININE 0.86 06/20/2015 1400   CALCIUM 9.7 06/20/2015 1400   GFRNONAA >60 06/20/2015 1400   GFRAA >60 06/20/2015 1400     Assessment/Plan: 2 Days Post-Op   Active Problems:   Back pain   Up with therapy Fever: likely due to atelectasis, no urinary sx. Check CXR today. Pulmonary toilet. PO pain control D/C home if CXR negative   Tucker Steedley, Cloyde ReamsBrian James 06/23/2015, 10:01 AM   Samson FredericBrian Dayami Taitt, MD Cell [phone]

## 2015-06-23 NOTE — Progress Notes (Signed)
Physical Therapy Treatment Patient Details Name: Tammy Mayo MRN: [id number] DOB: Aug 07, 1965 Today's Date: 06/23/2015    History of Present Illness TLIF Lumbar four-fiive POSTERIOR LUMBAR FUSION (1 LEVEL)    PT Comments    Pt making good progress and has supportive husband at home. Okay from PT standpoint to dc home with husband.  Follow Up Recommendations  No PT follow up     Equipment Recommendations  Rolling walker with 5" wheels;3in1 (PT)    Recommendations for Other Services       Precautions / Restrictions Precautions Precautions: Back Precaution Booklet Issued: Yes (comment) Required Braces or Orthoses: Spinal Brace Spinal Brace: Applied in sitting position;Lumbar corset    Mobility  Bed Mobility Overal bed mobility: Needs Assistance Bed Mobility: Sidelying to Sit   Sidelying to sit: Supervision       General bed mobility comments: Incr time  Transfers Overall transfer level: Needs assistance Equipment used: Rolling walker (2 wheeled) Transfers: Sit to/from Stand Sit to Stand: Supervision         General transfer comment: Incr time. Good technique  Ambulation/Gait Ambulation/Gait assistance: Supervision Ambulation Distance (Feet): 170 Feet Assistive device: Rolling walker (2 wheeled) Gait Pattern/deviations: Step-through pattern;Decreased stride length Gait velocity: decr Gait velocity interpretation: Below normal speed for age/gender General Gait Details: Steady gait with walker   Stairs Stairs: Yes Stairs assistance: Min guard Stair Management: One rail Right;Step to pattern;Forwards Number of Stairs: 2    Wheelchair Mobility    Modified Rankin (Stroke Patients Only)       Balance Overall balance assessment: Needs assistance Sitting-balance support: No upper extremity supported;Feet supported Sitting balance-Leahy Scale: Good     Standing balance support: No upper extremity supported;During functional  activity Standing balance-Leahy Scale: Fair                      Cognition Arousal/Alertness: Awake/alert Behavior During Therapy: WFL for tasks assessed/performed Overall Cognitive Status: Within Functional Limits for tasks assessed                      Exercises      General Comments        Pertinent Vitals/Pain Pain Assessment: Faces Faces Pain Scale: Hurts little more Pain Location: back Pain Descriptors / Indicators: Operative site guarding Pain Intervention(s): Limited activity within patient's tolerance    Home Living                      Prior Function            PT Goals (current goals can now be found in the care plan section) Acute Rehab PT Goals Patient Stated Goal: to return home PT Goal Formulation: With patient Time For Goal Achievement: 06/29/15 Potential to Achieve Goals: Good Progress towards PT goals: Goals met and updated - see care plan    Frequency  Min 5X/week    PT Plan Current plan remains appropriate    Co-evaluation             End of Session Equipment Utilized During Treatment: Gait belt;Back brace Activity Tolerance: Patient tolerated treatment well Patient left: Other (comment) (with OT in rehab gym)     Time: 2774-1287 PT Time Calculation (min) (ACUTE ONLY): 19 min  Charges:  $Gait Training: 8-22 mins                    G Codes:      Kolby Myung  06/23/2015, 10:32 AM Suanne Marker PT [phone]

## 2015-06-23 NOTE — Care Management Note (Signed)
Case Management Note  Patient Details  Name: Tammy Mayo MRN: [id number] Date of Birth: 11-23-1965  Subjective/Objective:                    Action/Plan: Patient was admitted for a PLIF. Lives at home with spouse. Will follow for discharge needs pending PT/OT evals and physician orders.  Expected Discharge Date:                  Expected Discharge Plan:  Home/Self Care  In-House Referral:     Discharge planning Services  CM Consult  Post Acute Care Choice:    Choice offered to:     DME Arranged:    DME Agency:     HH Arranged:    HH Agency:     Status of Service:  In process, will continue to follow  Medicare Important Message Given:    Date Medicare IM Given:    Medicare IM give by:    Date Additional Medicare IM Given:    Additional Medicare Important Message give by:     If discussed at Long Length of Stay Meetings, dates discussed:    Additional Comments:  Anda KraftRobarge, Adren Dollins C, RN 06/23/2015, 2:06 PM

## 2015-06-23 NOTE — Progress Notes (Signed)
Occupational Therapy Treatment Patient Details Name: SUHAYLAH WAMPOLE MRN: [id number] DOB: 05-Jul-1966 Today's Date: 06/23/2015    History of present illness 49 y.o. female s/p TLIF L4-5.   OT comments  Pt progressing well today. Pt completed all ADL and transfers at supervision level with AE. Pt demonstrated good adherence to back precautions and competence with AE. No further acute OT needs. Pt okay to d/c home with assistance from husband. OT signing off.   Follow Up Recommendations  No OT follow up;Supervision - Intermittent    Equipment Recommendations  3 in 1 bedside comode;Other (comment)    Recommendations for Other Services      Precautions / Restrictions Precautions Precautions: Back Precaution Booklet Issued: Yes (comment) Precaution Comments: Pt able to recall 2/3 back precautions at end of session Required Braces or Orthoses: Spinal Brace Spinal Brace: Applied in sitting position;Lumbar corset Restrictions Weight Bearing Restrictions: No       Mobility Bed Mobility Overal bed mobility: Needs Assistance Bed Mobility: Sidelying to Sit   Sidelying to sit: Supervision       General bed mobility comments: Pt received in rehab gym and positioned in chair at end of session  Transfers Overall transfer level: Needs assistance Equipment used: Rolling walker (2 wheeled) Transfers: Sit to/from Stand Sit to Stand: Supervision         General transfer comment: Pt demonstrated safe hand placement on seated surface/RW and good adherence to back precautions..    Balance Overall balance assessment: Needs assistance Sitting-balance support: No upper extremity supported;Feet supported Sitting balance-Leahy Scale: Good     Standing balance support: No upper extremity supported;During functional activity Standing balance-Leahy Scale: Fair                     ADL Overall ADL's : Needs assistance/impaired     Grooming: Wash/dry face;Oral  care;Supervision/safety;Cueing for compensatory techniques;Standing Grooming Details (indicate cue type and reason): Verbal cues for oral care compensatory strategy     Lower Body Bathing: Supervison/ safety;With caregiver independent assisting;With adaptive equipment;Cueing for compensatory techniques;Adhering to back precautions;Sit to/from stand Lower Body Bathing Details (indicate cue type and reason): Verbal cues for proper use of AE     Lower Body Dressing: Supervision/safety;With adaptive equipment;Cueing for compensatory techniques;Adhering to back precautions;Sitting/lateral leans Lower Body Dressing Details (indicate cue type and reason): Verbal cues for proper use of AE Toilet Transfer: Supervision/safety;Ambulation;BSC;RW   Toileting- Clothing Manipulation and Hygiene: Set up;Supervision/safety;Cueing for sequencing;Cueing for compensatory techniques;Cueing for back precautions;Sit to/from stand Toileting - Clothing Manipulation Details (indicate cue type and reason): Verbal cues for proper use of toilet aid Tub/ Shower Transfer: Tub transfer;Min guard;Cueing for sequencing;Ambulation;Rolling walker Tub/Shower Transfer Details (indicate cue type and reason): Verbal cues for safe hand placement and RW positioning during transfer Functional mobility during ADLs: Supervision/safety;Rolling walker General ADL Comments: Reviewed and practiced compensatory techniques for oral and hair care. Educated and practiced with AE for LB ADLs. Pt completed tub transfer with min guard assist for safety and was able to maintain balance unsupported with eyes closed for 10 seconds. Pt reported feeling comfortable with tub transfer and acknowledged ability to put 3 in 1 in tub if needed. Educated pt and husband on position changes throughout the day.       Vision                     Perception     Praxis      Cognition   Behavior During Therapy:  WFL for tasks assessed/performed Overall  Cognitive Status: Within Functional Limits for tasks assessed       Memory: Decreased recall of precautions               Extremity/Trunk Assessment               Exercises     Shoulder Instructions       General Comments      Pertinent Vitals/ Pain       Pain Assessment: 0-10 Pain Score: 5  Faces Pain Scale: Hurts little more Pain Location: back Pain Descriptors / Indicators: Aching;Operative site guarding Pain Intervention(s): Limited activity within patient's tolerance;Monitored during session;Repositioned  Home Living                                          Prior Functioning/Environment              Frequency       Progress Toward Goals  OT Goals(current goals can now be found in the care plan section)  Progress towards OT goals: Goals met/education completed, patient discharged from OT  Acute Rehab OT Goals Patient Stated Goal: to return home OT Goal Formulation: With patient Time For Goal Achievement: 06/29/15 Potential to Achieve Goals: Good ADL Goals Pt Will Perform Lower Body Bathing: with set-up;with supervision;with adaptive equipment;with caregiver independent in assisting;sit to/from stand Pt Will Perform Lower Body Dressing: with set-up;with supervision;with adaptive equipment;with caregiver independent in assisting;sit to/from stand Pt Will Transfer to Toilet: with modified independence;bedside commode;ambulating Pt Will Perform Toileting - Clothing Manipulation and hygiene: with supervision;with caregiver independent in assisting;with adaptive equipment;sit to/from stand Pt Will Perform Tub/Shower Transfer: with min guard assist;with caregiver independent in assisting;3 in 1;ambulating;Tub transfer Additional ADL Goal #1: Pt/husband will be independent with donning/doffing lumbar corsett Additional ADL Goal #2: Pt will be independent with 3/3 back precuations for ADL  Plan All goals met and education completed,  patient discharged from OT services    Co-evaluation                 End of Session Equipment Utilized During Treatment: Gait belt;Rolling walker;Back brace   Activity Tolerance Patient tolerated treatment well   Patient Left in chair;with call bell/phone within reach;with chair alarm set;with family/visitor present   Nurse Communication Mobility status        Time: 1015-1040 OT Time Calculation (min): 25 min  Charges: OT General Charges $OT Visit: 1 Procedure OT Treatments $Self Care/Home Management : 23-37 mins  Redmond Baseman 06/23/2015, 11:03 AM

## 2015-06-28 NOTE — Discharge Summary (Signed)
Physician Discharge Summary  Patient ID: Tammy Mayo MRN: [id number] DOB/AGE: [birth year] 49 y.o.  Admit date: 06/21/2015 Discharge date: 06/28/2015  Admission Diagnoses:  Degenerative Spondylothesis with RLE pain  Discharge Diagnoses:  Active Problems:   Back pain   Past Medical History  Diagnosis Date  . Hypertension   . Hyperlipidemia   . Vitamin D deficiency   . Diabetes mellitus without complication (HCC)   . Wears glasses   . Seasonal allergies   . GERD (gastroesophageal reflux disease)   . Arthritis   . Chronic back pain     lumbar 4-5 slip and radicular right leg pain    Surgeries: Procedure(s): TLIF Lumbar four-fiive     POSTERIOR LUMBAR FUSION (1 LEVEL) on 06/21/2015   Consultants (if any):    Discharged Condition: Improved  Hospital Course: Tammy Mayo is an 49 y.o. female who was admitted 06/21/2015 with a diagnosis of Lumbar Sondylolithesis and RLE pain and went to the operating room on 06/21/2015 and underwent the above named procedures.  DC home on 06/23/15  She was given perioperative antibiotics:  Anti-infectives    Start     Dose/Rate Route Frequency Ordered Stop   06/22/15 0000  ceFAZolin (ANCEF) IVPB 2 g/50 mL premix     2 g 100 mL/hr over 30 Minutes Intravenous Every 8 hours 06/21/15 1745 06/22/15 0918   06/21/15 1200  ceFAZolin (ANCEF) IVPB 2 g/50 mL premix  Status:  Discontinued     2 g 100 mL/hr over 30 Minutes Intravenous To [facility] 06/20/15 1320 06/21/15 1738    .  She was given sequential compression devices, early ambulation, and TED for DVT prophylaxis.  She benefited maximally from the hospital stay and there were no complications.    Recent vital signs:  Filed Vitals:   06/23/15 0605 06/23/15 1018  BP: 109/61 96/67  Pulse: 123 102  Temp: 100.8 F (38.2 C) 99 F (37.2 C)  Resp: 18 20    Recent laboratory studies:  Lab Results  Component Value Date   HGB 14.1 06/20/2015   HGB 10.8* 11/12/2012    Lab Results  Component Value Date   WBC 7.1 06/20/2015   PLT 307 06/20/2015   No results found for: INR Lab Results  Component Value Date   NA 138 06/20/2015   K 3.6 06/20/2015   CL 104 06/20/2015   CO2 26 06/20/2015   BUN 10 06/20/2015   CREATININE 0.86 06/20/2015   GLUCOSE 92 06/20/2015    Discharge Medications:     Medication List    STOP taking these medications        medroxyPROGESTERone 10 MG tablet  Commonly known as:  PROVERA     meloxicam 7.5 MG tablet  Commonly known as:  MOBIC     traMADol 50 MG tablet  Commonly known as:  ULTRAM      TAKE these medications        Calcium-Magnesium-Zinc 333-133-5 MG Tabs  Take 1 tablet by mouth daily.     cholecalciferol 1000 UNITS tablet  Commonly known as:  VITAMIN D  Take 1,000 Units by mouth daily.     estrogen (conjugated)-medroxyprogesterone 0.625-2.5 MG tablet  Commonly known as:  PREMPRO  Take 1 tablet by mouth daily.     hydrochlorothiazide 12.5 MG capsule  Commonly known as:  MICROZIDE  Take 12.5 mg by mouth daily.     lisinopril 40 MG tablet  Commonly known as:  PRINIVIL,ZESTRIL  Take 40 mg by  mouth daily.     methocarbamol 500 MG tablet  Commonly known as:  ROBAXIN  Take 1 tablet (500 mg total) by mouth 3 (three) times daily as needed for muscle spasms.     neomycin-bacitracin-polymyxin ointment  Commonly known as:  NEOSPORIN  Apply 1 application topically daily as needed for wound care. apply to eye     ondansetron 4 MG tablet  Commonly known as:  ZOFRAN  Take 1 tablet (4 mg total) by mouth every 8 (eight) hours as needed for nausea or vomiting.     OVER THE COUNTER MEDICATION  Take 1 tablet by mouth at bedtime. Cold Relief     oxyCODONE-acetaminophen 10-325 MG tablet  Commonly known as:  PERCOCET  Take 1 tablet by mouth every 4 (four) hours as needed for pain.     oxymetazoline 0.05 % nasal spray  Commonly known as:  AFRIN  Place 1 spray into both nostrils 2 (two) times daily as  needed for congestion (over the counter nasal spray).     pyridOXINE 100 MG tablet  Commonly known as:  VITAMIN B-6  Take 100 mg by mouth daily.     rosuvastatin 10 MG tablet  Commonly known as:  CRESTOR  Take 10 mg by mouth daily.     XIGDUO XR 04-999 MG Tb24  Generic drug:  Dapagliflozin-Metformin HCl ER  Take 1 tablet by mouth daily with supper.        Diagnostic Studies: Dg Chest 1 View  06/23/2015  CLINICAL DATA:  Lumbar spine surgery 2 days ago. Fever last evening. Temperature has normalized. EXAM: CHEST  1 VIEW COMPARISON:  Two-view chest x-ray 08/06/2005. FINDINGS: The heart size is normal. The lung volumes are low. Minimal left basilar airspace disease likely reflects atelectasis. There is no edema or effusion. The visualized soft tissues and bony thorax are unremarkable. IMPRESSION: 1. Low lung volumes and minimal left basilar airspace disease likely reflects atelectasis. No significant consolidated airspace disease. Electronically Signed   By: Marin Roberts M.D.   On: 06/23/2015 11:08   Dg Lumbar Spine 2-3 Views  06/22/2015  CLINICAL DATA:  Status post L4-5 laminectomies and fixation yesterday. EXAM: LUMBAR SPINE - 2-3 VIEW COMPARISON:  Intraoperative fluoroscopic images from 06/21/2015. FINDINGS: Patient is status post L4-5 posterior fixation with intervening disc spacer. Hardware appears intact and well aligned. Osseous alignment appears stable compared to the earlier images, with a stable minimal grade 1 anterolisthesis of L4 on L5. No evidence of surgical complicating feature. Paravertebral soft tissues are unremarkable. IMPRESSION: Postsurgical changes as above. No post surgical complicating feature seen. Electronically Signed   By: Bary Richard M.D.   On: 06/22/2015 08:27   Dg Lumbar Spine 2-3 Views  06/21/2015  CLINICAL DATA:  Status post posterior fusion of L4-5. EXAM: LUMBAR SPINE - 2-3 VIEW; DG C-ARM GT 120 MIN COMPARISON:  None. FLUOROSCOPY TIME:  3 minutes  17 seconds FINDINGS: Three intraoperative fluoroscopic images of the lower lumbar spine demonstrate the patient be status post surgical posterior fusion of L4-5 with interbody fusion. Minimal grade 1 anterolisthesis of L4-5 is noted. IMPRESSION: Status post surgical posterior fusion of L4-5. Electronically Signed   By: Lupita Raider, M.D.   On: 06/21/2015 16:10   Dg C-arm Gt 120 Min  06/21/2015  CLINICAL DATA:  Status post posterior fusion of L4-5. EXAM: LUMBAR SPINE - 2-3 VIEW; DG C-ARM GT 120 MIN COMPARISON:  None. FLUOROSCOPY TIME:  3 minutes 17 seconds FINDINGS: Three intraoperative fluoroscopic images  of the lower lumbar spine demonstrate the patient be status post surgical posterior fusion of L4-5 with interbody fusion. Minimal grade 1 anterolisthesis of L4-5 is noted. IMPRESSION: Status post surgical posterior fusion of L4-5. Electronically Signed   By: Lupita RaiderJames  Green Jr, M.D.   On: 06/21/2015 16:10    Disposition: 01-Home or Self Care      Discharge Instructions    Call MD / Call 911    Complete by:  As directed   If you experience chest pain or shortness of breath, CALL 911 and be transported to the hospital emergency room.  If you develope a fever above 101 F, pus (white drainage) or increased drainage or redness at the wound, or calf pain, call your surgeon's office.     Constipation Prevention    Complete by:  As directed   Drink plenty of fluids.  Prune juice may be helpful.  You may use a stool softener, such as Colace (over the counter) 100 mg twice a day.  Use MiraLax (over the counter) for constipation as needed.     Diet - low sodium heart healthy    Complete by:  As directed      Driving restrictions    Complete by:  As directed   No driving for 6 weeks     Increase activity slowly as tolerated    Complete by:  As directed      Lifting restrictions    Complete by:  As directed   No lifting for 6 weeks           Follow-up Information    Follow up with Alvy BealBROOKS,DAHARI  D, MD. Schedule an appointment as soon as possible for a visit in 2 weeks.   Specialty:  Orthopedic Surgery   Why:  If symptoms worsen, For suture removal, For wound re-check   Contact information:   [address] [place] [address] [phone]        Signed: Kirt BoysMayo, Shunsuke Granzow Christina 06/28/2015, 1:34 PM

## 2015-06-29 ENCOUNTER — Encounter (HOSPITAL_COMMUNITY): Payer: Self-pay | Admitting: Orthopedic Surgery

## 2015-08-14 ENCOUNTER — Ambulatory Visit: Payer: BLUE CROSS/BLUE SHIELD | Attending: Orthopedic Surgery | Admitting: Physical Therapy

## 2015-08-14 DIAGNOSIS — R5381 Other malaise: Secondary | ICD-10-CM | POA: Diagnosis present

## 2015-08-14 DIAGNOSIS — M5441 Lumbago with sciatica, right side: Secondary | ICD-10-CM

## 2015-08-14 DIAGNOSIS — M24651 Ankylosis, right hip: Secondary | ICD-10-CM | POA: Diagnosis present

## 2015-08-14 NOTE — Therapy (Signed)
[facility] [address] [place], [place], [address] Phone: [phone]   Fax:  [phone]  Physical Therapy Evaluation  Patient Details  Name: Hassan Bucklerlizabeth A Goodroe MRN: [id number] Date of Birth: 06-02-1966 Referring Provider: Venita Lickahari Brooks MD.  Encounter Date: 08/14/2015      PT End of Session - 08/14/15 1207    Visit Number 1   Number of Visits 12   PT Start Time 1127   PT Stop Time 1211   PT Time Calculation (min) 44 min   Equipment Utilized During Treatment Back brace   Activity Tolerance Patient tolerated treatment well   Behavior [facility] for tasks assessed/performed      Past Medical History  Diagnosis Date  . Hypertension   . Hyperlipidemia   . Vitamin D deficiency   . Diabetes mellitus without complication (HCC)   . Wears glasses   . Seasonal allergies   . GERD (gastroesophageal reflux disease)   . Arthritis   . 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 Tubal ligation    . Dilation and curettage of uterus    . Cesarean section      x 1  . Hernia repair      umbilical hernia repair as a child    There were no vitals filed for this visit.  Visit Diagnosis:  Right-sided low back pain with right-sided sciatica - Plan: PT plan of care cert/re-cert  Decreased range of motion of hip, right - Plan: PT plan of care cert/re-cert  Debility - Plan: PT plan of care cert/re-cert      Subjective Assessment - 08/14/15 1129    Subjective Pain increases when I move around a lot.   Limitations Sitting;Standing   How long can you sit comfortably? 10 minutes.   How long can you stand comfortably? 10 minutes   How long can you walk comfortably? Short distances.   Patient Stated Goals Get out of pain and back to work.   Currently in Pain? Yes   Pain Score 5    Pain Location Back   Pain Orientation Left   Pain Descriptors /  Indicators Constant   Pain Type Acute pain   Pain Onset More than a month ago   Pain Frequency Constant   Aggravating Factors  Movement and prolonged sitting, standing and walking.   Pain Relieving Factors Medications.            [facility] - 08/14/15 0001    Assessment   Medical Diagnosis L4-L5 lumbar fusion.   Referring Provider Venita Lickahari Brooks MD.   Onset Date/Surgical Date --  06/21/15 (surgery date).   Next MD Visit --  09/15/15.   Precautions   Precautions Back   Required Braces or Orthoses Spinal Brace   Spinal Brace --  Lumbar corset.   Balance Screen   Has the patient fallen in the past 6 months No   Has the patient had a decrease in activity level because of a fear of falling?  Yes   Is the patient reluctant to leave their home because of a fear of falling?  No   Home [employer]   Living Environment Private residence   Prior Function   Level of Independence Independent   Posture/Postural Control   Posture/Postural Control No significant limitations   ROM / Strength   AROM / PROM / Strength AROM;Strength  AROM   Overall AROM Comments In supine the patient's right hip flexion= 90 degrees and left= 110 degrees.   Strength   Overall Strength Comments Normal bilateral knee and ankle strength.   Palpation   Palpation comment Tender to palpation right of lumbar incisional site and musculature is this region is guarded.   Special Tests    Special Tests --  Normal LE DTR's.   Ambulation/Gait   Gait Comments Patient ambulating in goos uprigth posture with lumbar corset donned.                   [facility] Adult PT Treatment/Exercise - 08/14/15 0001    Modalities   Modalities Electrical Stimulation;Moist Heat   Moist Heat Therapy   Number Minutes Moist Heat 15 Minutes   Electrical Stimulation   Electrical Stimulation Location IFC to patient's bilateral low back region at 80-150 HZ 100% scan x 15 minutes.                     PT Long  Term Goals - 08/14/15 1249    Additional Long Term Goals   Additional Long Term Goals Yes   PT LONG TERM GOAL #6   Title Bilateral hip flexion= 115 degrees.   Time 4   Period Weeks   Status New               Plan - 08/14/15 1130    Clinical Impression Statement The patient reports that on 10/24/14 while lifting something heavy at work she her injured her back.  Pain shooting down right LE.  Patient underwent a L4-L5 lumbar fusion surgery on 06/21/15.  Pain at rest a 4.5/10 today.  Patient still in lumbar corset.  Pain rise to 6-7/10 with movement.  Last night felt like a muscle spasm in right low back and calf.. Patient was doing great before the lifting accident and was living a very active life.  The patient's sleep is disturbed by pain.   Pt will benefit from skilled therapeutic intervention in order to improve on the following deficits Pain;Decreased activity tolerance;Decreased range of motion   Rehab Potential Excellent   PT Frequency 3x / week   PT Duration 4 weeks   PT Treatment/Interventions ADLs/Self Care Home Management;Electrical Stimulation;Moist Heat;Therapeutic exercise;Therapeutic activities;Ultrasound;Patient/family education;Manual techniques   PT Next Visit Plan Single knee to chest stretch; begin low-level draw-in and core exercises.  Patient would benefit from STW/M in sdly position with pillow between knees for comfort.   Consulted and Agree with Plan of Care Patient         Problem List Patient Active Problem List   Diagnosis Date Noted  . Back pain 06/21/2015  . HTN (hypertension) 10/23/2010  . Vitamin D deficiency 10/23/2010  . Hyperlipemia 10/23/2010  . Plantar fasciitis 10/23/2010    Lowella Kindley, [place] MPT 08/14/2015, 12:53 PM  [facility] [address] [place], [place], [address] Phone: [phone]   Fax:  [phone]  Name: KARLYNN FURROW MRN: [id number] Date of Birth: Jul 30, 1965

## 2015-08-15 ENCOUNTER — Ambulatory Visit: Payer: BLUE CROSS/BLUE SHIELD | Admitting: Physical Therapy

## 2015-08-15 DIAGNOSIS — M5441 Lumbago with sciatica, right side: Secondary | ICD-10-CM

## 2015-08-15 DIAGNOSIS — M24651 Ankylosis, right hip: Secondary | ICD-10-CM

## 2015-08-15 DIAGNOSIS — R5381 Other malaise: Secondary | ICD-10-CM

## 2015-08-15 NOTE — Therapy (Signed)
[facility] [address] [place], [place], [address] Phone: [phone]   Fax:  [phone]  Physical Therapy Treatment  Patient Details  Name: ANISHA STARLIPER MRN: [id number] Date of Birth: 02-28-1966 Referring Provider: Venita Lick MD.  Encounter Date: 08/15/2015      PT End of Session - 08/15/15 1614    PT Start Time 0315   PT Stop Time 0404   PT Time Calculation (min) 49 min   Equipment Utilized During Treatment Back brace   Activity Tolerance Patient tolerated treatment well   Behavior During Therapy [facility] for tasks assessed/performed      Past Medical History  Diagnosis Date  . Hypertension   . Hyperlipidemia   . Vitamin D deficiency   . Diabetes mellitus without complication (HCC)   . Wears glasses   . Seasonal allergies   . GERD (gastroesophageal reflux disease)   . Arthritis   . 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 Tubal ligation    . Dilation and curettage of uterus    . Cesarean section      x 1  . Hernia repair      umbilical hernia repair as a child    There were no vitals filed for this visit.  Visit Diagnosis:  Right-sided low back pain with right-sided sciatica  Decreased range of motion of hip, right  Debility      Subjective Assessment - 08/14/15 1129    Subjective Pain increases when I move around a lot.   Limitations Sitting;Standing   How long can you sit comfortably? 10 minutes.   How long can you stand comfortably? 10 minutes   How long can you walk comfortably? Short distances.   Patient Stated Goals Get out of pain and back to work.   Currently in Pain? Yes   Pain Score 5    Pain Location Back   Pain Orientation Left   Pain Descriptors / Indicators Constant   Pain Type Acute pain   Pain Onset More than a month ago   Pain Frequency Constant   Aggravating Factors  Movement and prolonged  sitting, standing and walking.   Pain Relieving Factors Medications.                         [facility] Adult PT Treatment/Exercise - 08/15/15 0001    Exercises   Exercises Lumbar;Knee/Hip   Knee/Hip Exercises: Aerobic   Nustep Level Nustep x 15 minutes for hip range of motion   Knee/Hip Exercises: Supine   Other Supine Knee/Hip Exercises Right SKTC with 30 sec holds muliple reps   Other Supine Knee/Hip Exercises Draw-in core activation with 10 sec holds multiple reps and sets.   Modalities   Modalities Electrical Stimulation;Moist Heat   Moist Heat Therapy   Number Minutes Moist Heat 15 Minutes   Electrical Stimulation   Electrical Stimulation Location Bil Lumbar    Electrical Stimulation Action IFC   Electrical Stimulation Parameters 80-150 HZ x 15 minutes.                PT Education - 08/15/15 1619    Education provided Yes   Education Details Right SKTC stretch and draw-ins performed in supine.   Person(s) Educated Patient   Methods Explanation;Demonstration;Verbal cues;Handout   Comprehension Verbalized understanding;Returned demonstration  PT Long Term Goals - 08/14/15 1249    Additional Long Term Goals   Additional Long Term Goals Yes   PT LONG TERM GOAL #6   Title Bilateral hip flexion= 115 degrees.   Time 4   Period Weeks   Status New               Plan - 08/14/15 1130    Clinical Impression Statement The patient reports that on 10/24/14 while lifting something heavy at work she her injured her back.  Pain shooting down right LE.  Patient underwent a L4-L5 lumbar fusion surgery on 06/21/15.  Pain at rest a 4.5/10 today.  Patient still in lumbar corset.  Pain rise to 6-7/10 with movement.  Last night felt like a muscle spasm in right low back and calf.. Patient was doing great before the lifting accident and was living a very active life.  The patient's sleep is disturbed by pain.   Pt will benefit from skilled  therapeutic intervention in order to improve on the following deficits Pain;Decreased activity tolerance;Decreased range of motion   Rehab Potential Excellent   PT Frequency 3x / week   PT Duration 4 weeks   PT Treatment/Interventions ADLs/Self Care Home Management;Electrical Stimulation;Moist Heat;Therapeutic exercise;Therapeutic activities;Ultrasound;Patient/family education;Manual techniques   PT Next Visit Plan Single knee to chest stretch; begin low-level draw-in and core exercises.  Patient would benefit from STW/M in sdly position with pillow between knees for comfort.   Consulted and Agree with Plan of Care Patient        Problem List Patient Active Problem List   Diagnosis Date Noted  . Back pain 06/21/2015  . HTN (hypertension) 10/23/2010  . Vitamin D deficiency 10/23/2010  . Hyperlipemia 10/23/2010  . Plantar fasciitis 10/23/2010    APPLE[place], [place] MPT 08/15/2015, 5:22 PM  [facility] [address] [place], [place], [address] Phone: [phone]   Fax:  [phone]  Name: ONALEE STEINBACH MRN: [id number] Date of Birth: 09/04/1965

## 2015-08-21 ENCOUNTER — Encounter: Payer: Self-pay | Admitting: Physical Therapy

## 2015-08-21 ENCOUNTER — Ambulatory Visit: Payer: BLUE CROSS/BLUE SHIELD | Admitting: Physical Therapy

## 2015-08-21 DIAGNOSIS — M5441 Lumbago with sciatica, right side: Secondary | ICD-10-CM

## 2015-08-21 DIAGNOSIS — M24651 Ankylosis, right hip: Secondary | ICD-10-CM

## 2015-08-21 DIAGNOSIS — R5381 Other malaise: Secondary | ICD-10-CM

## 2015-08-21 NOTE — Therapy (Signed)
[facility] [address] [place], [place], [address] Phone: [phone]   Fax:  [phone]  Physical Therapy Treatment  Patient Details  Name: Tammy Mayo MRN: [id number] Date of Birth: November 01, 1965 Referring Provider: Venita Lick MD.  Encounter Date: 08/21/2015      PT End of Session - 08/21/15 1039    Visit Number 3   Number of Visits 12   PT Start Time 1031   PT Stop Time 1126   PT Time Calculation (min) 55 min   Equipment Utilized During Treatment Back brace   Activity Tolerance Patient tolerated treatment well   Behavior During Therapy [facility] for tasks assessed/performed      Past Medical History  Diagnosis Date  . Hypertension   . Hyperlipidemia   . Vitamin D deficiency   . Diabetes mellitus without complication (HCC)   . Wears glasses   . Seasonal allergies   . GERD (gastroesophageal reflux disease)   . Arthritis   . 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 Tubal ligation    . Dilation and curettage of uterus    . Cesarean section      x 1  . Hernia repair      umbilical hernia repair as a child    There were no vitals filed for this visit.  Visit Diagnosis:  Right-sided low back pain with right-sided sciatica  Decreased range of motion of hip, right  Debility      Subjective Assessment - 08/21/15 1037    Subjective Patient states that she still is sore since she had her surgery. States that before therapy pain was on R low back now is all the way her low back.   Limitations Sitting;Standing   How long can you sit comfortably? 20-30 minutes   How long can you stand comfortably? 10 minutes   How long can you walk comfortably? Short distances.   Patient Stated Goals Get out of pain and back to work.   Currently in Pain? Yes   Pain Score 4    Pain Location Back   Pain Orientation Lower   Pain Descriptors /  Indicators Sore   Pain Type Acute pain   Pain Onset More than a month ago   Aggravating Factors  Movement, prolonged sitting, standing, and walking   Pain Relieving Factors Medications            [facility] PT Assessment - 08/21/15 0001    Assessment   Medical Diagnosis L4-L5 lumbar fusion.   Onset Date/Surgical Date 06/20/16   Next MD Visit 09/15/2015   Precautions   Precautions Back   Required Braces or Orthoses Spinal Brace                     [facility] Adult PT Treatment/Exercise - 08/21/15 0001    Lumbar Exercises: Stretches   Single Knee to Chest Stretch 5 reps;30 seconds;Other (comment)  BLE; reported pain in R low back with RLE stretch   Lumbar Exercises: Supine   Ab Set 20 reps;3 seconds   Bent Knee Raise 20 reps   Bridge 10 reps;Other (comment)  Reported 5/10 R low back pain   Lumbar Exercises: Sidelying   Clam 20 reps;Other (comment)  Bilaterally   Knee/Hip Exercises: Aerobic   Nustep L4 x15 min   Modalities   Modalities Electrical Stimulation;Moist Heat   Moist  Heat Therapy   Number Minutes Moist Heat 15 Minutes   Moist Heat Location Lumbar Spine   Electrical Stimulation   Electrical Stimulation Location B low back   Electrical Stimulation Action IFC   Electrical Stimulation Parameters 80-150 Hz x15 min   Electrical Stimulation Goals Pain                     PT Long Term Goals - 08/21/15 1124    PT LONG TERM GOAL #1   Title Ind with a HEP.   Time 4   Period Weeks   Status Achieved   PT LONG TERM GOAL #2   Title Sit 30 minutes with pain not > 3/10.   Time 4   Period Weeks   Status On-going   PT LONG TERM GOAL #3   Title Stand 30 minutes with pain not > 3/10.   Time 4   Period Weeks   Status On-going   PT LONG TERM GOAL #4   Title Sleep 6 hours undisturbed.   Time 4   Period Weeks   Status On-going   PT LONG TERM GOAL #5   Title Perform ADL's with pain not > 3-4/10.   Time 4   Period Weeks   Status On-going   PT LONG  TERM GOAL #6   Title Bilateral hip flexion= 115 degrees.   Time 4   Period Weeks   Status On-going               Plan - 08/21/15 1055    Clinical Impression Statement Patient tolerated today's treatment fairly well although she reported low back prior to treatment. Patient completed exercises with core activation as directed and with minimal mulitmodal cueing for correct exercise technique. Experienced R low back pain with SKTC and sidelying R clamshell although she reported pain was worse with SKTC stretch. Bridging exercise was difficult for patient today secondary to pain experienced. Patient asked whether she should still watch bending at home and was advised to continue avoiding bending, rotation, and stooping as advised before until otherwise nottified by MD.  Normal modalities response noted following removal of the modalities. Patient experienced 3/10 low back pain upon end of treatment and was encouraged to continue HEP given in previous treatment.   Pt will benefit from skilled therapeutic intervention in order to improve on the following deficits Pain;Decreased activity tolerance;Decreased range of motion   Rehab Potential Excellent   Clinical Impairments Affecting Rehab Potential 9 weeks post-op 08/23/2015   PT Frequency 3x / week   PT Duration 4 weeks   PT Treatment/Interventions ADLs/Self Care Home Management;Electrical Stimulation;Moist Heat;Therapeutic exercise;Therapeutic activities;Ultrasound;Patient/family education;Manual techniques   PT Next Visit Plan Single knee to chest stretch; begin low-level draw-in and core exercises.  STW to low back in sidelying as deemed necessary.   Consulted and Agree with Plan of Care Patient        Problem List Patient Active Problem List   Diagnosis Date Noted  . Back pain 06/21/2015  . HTN (hypertension) 10/23/2010  . Vitamin D deficiency 10/23/2010  . Hyperlipemia 10/23/2010  . Plantar fasciitis 10/23/2010    Tammy Mayo, PTA 08/21/2015, 11:30 AM  [facility] [address] [place], [place], [address] Phone: [phone]   Fax:  [phone]  Name: Tammy Mayo MRN: [id number] Date of Birth: 06-Oct-1965

## 2015-08-23 ENCOUNTER — Encounter: Payer: Self-pay | Admitting: Physical Therapy

## 2015-08-23 ENCOUNTER — Ambulatory Visit: Payer: BLUE CROSS/BLUE SHIELD | Admitting: Physical Therapy

## 2015-08-23 DIAGNOSIS — M5441 Lumbago with sciatica, right side: Secondary | ICD-10-CM

## 2015-08-23 DIAGNOSIS — M24651 Ankylosis, right hip: Secondary | ICD-10-CM

## 2015-08-23 DIAGNOSIS — R5381 Other malaise: Secondary | ICD-10-CM

## 2015-08-23 NOTE — Therapy (Signed)
[facility] [address] [place], [place], [address] Phone: [phone]   Fax:  [phone]  Physical Therapy Treatment  Patient Details  Name: Tammy Mayo MRN: [id number] Date of Birth: Jun 14, 1966 Referring Provider: Venita Lick MD.  Encounter Date: 08/23/2015      PT End of Session - 08/23/15 1350    Visit Number 4   Number of Visits 12   PT Start Time 1347   PT Stop Time 1435   PT Time Calculation (min) 48 min   Equipment Utilized During Treatment Back brace   Activity Tolerance Patient tolerated treatment well   Behavior During Therapy [facility] for tasks assessed/performed      Past Medical History  Diagnosis Date  . Hypertension   . Hyperlipidemia   . Vitamin D deficiency   . Diabetes mellitus without complication (HCC)   . Wears glasses   . Seasonal allergies   . GERD (gastroesophageal reflux disease)   . Arthritis   . 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 Tubal ligation    . Dilation and curettage of uterus    . Cesarean section      x 1  . Hernia repair      umbilical hernia repair as a child    There were no vitals filed for this visit.  Visit Diagnosis:  Right-sided low back pain with right-sided sciatica  Decreased range of motion of hip, right  Debility      Subjective Assessment - 08/23/15 1348    Subjective Patient reports that low back still hurts at this time. Reports that she experienced R lateral knee numbness this morning. Reports that she is still having muscle spasms but not as bad as she was prior to surgery. She reports she was cooking yesterday as the paper given to her previously said she could cook.   Limitations Sitting;Standing   How long can you sit comfortably? 20-30 minutes   How long can you stand comfortably? 10 minutes   How long can you walk comfortably? Short distances.   Patient  Stated Goals Get out of pain and back to work.   Currently in Pain? Yes   Pain Score 4    Pain Location Back   Pain Orientation Lower   Pain Descriptors / Indicators Aching   Pain Type Acute pain   Pain Onset More than a month ago   Pain Frequency Constant            [facility] PT Assessment - 08/23/15 0001    Assessment   Medical Diagnosis L4-L5 lumbar fusion.   Onset Date/Surgical Date 06/20/16   Next MD Visit 09/15/2015   Precautions   Precautions Back   Required Braces or Orthoses Spinal Brace   ROM / Strength   AROM / PROM / Strength AROM   AROM   Overall AROM  Deficits   AROM Assessment Site Hip   Right/Left Hip Right;Left   Right Hip Flexion 58  painful in R low back per patient report   Left Hip Flexion 90                     [facility] Adult PT Treatment/Exercise - 08/23/15 0001    Lumbar Exercises: Stretches   Single Knee to Chest Stretch 3 reps;30 seconds;Other (comment)  BLE   Lumbar Exercises: Aerobic   Stationary Bike L0  x12 min with VCs for core activation   Lumbar Exercises: Supine   Bent Knee Raise 20 reps   Bridge 5 reps;Other (comment)  Reported R low back pain 6/10   Straight Leg Raise 10 reps;Other (comment)  BLE   Lumbar Exercises: Sidelying   Clam 20 reps;Other (comment)  BLE   Modalities   Modalities Electrical Stimulation;Moist Heat   Moist Heat Therapy   Number Minutes Moist Heat 15 Minutes   Moist Heat Location Lumbar Spine   Electrical Stimulation   Electrical Stimulation Location B low back   Electrical Stimulation Action Pre-Mod   Electrical Stimulation Parameters 80-150 Hz x15 min   Electrical Stimulation Goals Pain                     PT Long Term Goals - 08/23/15 1429    PT LONG TERM GOAL #1   Title Ind with a HEP.   Time 4   Period Weeks   Status Achieved   PT LONG TERM GOAL #2   Title Sit 30 minutes with pain not > 3/10.   Time 4   Period Weeks   Status On-going   PT LONG TERM GOAL #3   Title  Stand 30 minutes with pain not > 3/10.   Time 4   Period Weeks   Status On-going   PT LONG TERM GOAL #4   Title Sleep 6 hours undisturbed.   Time 4   Period Weeks   Status On-going   PT LONG TERM GOAL #5   Title Perform ADL's with pain not > 3-4/10.   Time 4   Period Weeks   Status On-going   PT LONG TERM GOAL #6   Title Bilateral hip flexion= 115 degrees.   Time 4   Period Weeks   Status On-going  R hip flexion 58 deg (painful), L hip flexion 90 deg in supine 08/23/2015               Plan - 08/23/15 1425    Clinical Impression Statement Patient tolerated today's treatment fairly well although she reported 4/10 low back pain prior to treatment. Today patient's main complaint was R low back pain but she stated that R low back did not feel tight. All exercises completed today were directed with core activation for core strengthening. Upon ROM measurement of B hip flexion patient experienced R low back pain upon R hip flexion measurement. Continues to report low back pain with bridging exercise that was rated as 6/10 today. Normal modalities response noted following removal of the modalities. Goals remain on-going at this time secondary to difficulty sleeping, pain with activity, deficits in R hip ROM. Patient experienced low back feeling "looser" following today's treatment.   Pt will benefit from skilled therapeutic intervention in order to improve on the following deficits Pain;Decreased activity tolerance;Decreased range of motion   Rehab Potential Excellent   Clinical Impairments Affecting Rehab Potential 9 weeks post-op 08/23/2015   PT Frequency 3x / week   PT Duration 4 weeks   PT Treatment/Interventions ADLs/Self Care Home Management;Electrical Stimulation;Moist Heat;Therapeutic exercise;Therapeutic activities;Ultrasound;Patient/family education;Manual techniques   PT Next Visit Plan Single knee to chest stretch; begin low-level draw-in and core exercises.  Assess R low back  musculature during manual therapy next treatment.   Consulted and Agree with Plan of Care Patient        Problem List Patient Active Problem List   Diagnosis Date Noted  . Back pain 06/21/2015  .  HTN (hypertension) 10/23/2010  . Vitamin D deficiency 10/23/2010  . Hyperlipemia 10/23/2010  . Plantar fasciitis 10/23/2010    Evelene Croon, PTA 08/23/2015, 2:52 PM  [facility] [address] [place], [place], [address] Phone: [phone]   Fax:  [phone]  Name: ROMESHA SCHERER MRN: [id number] Date of Birth: March 21, 1966

## 2015-08-28 ENCOUNTER — Encounter: Payer: Self-pay | Admitting: Physical Therapy

## 2015-08-28 ENCOUNTER — Ambulatory Visit: Payer: BLUE CROSS/BLUE SHIELD | Admitting: Physical Therapy

## 2015-08-28 DIAGNOSIS — M5441 Lumbago with sciatica, right side: Secondary | ICD-10-CM

## 2015-08-28 DIAGNOSIS — R5381 Other malaise: Secondary | ICD-10-CM

## 2015-08-28 DIAGNOSIS — M24651 Ankylosis, right hip: Secondary | ICD-10-CM

## 2015-08-28 NOTE — Therapy (Signed)
[facility] [address] [place], [place], [address] Phone: [phone]   Fax:  [phone]  Physical Therapy Treatment  Patient Details  Name: Tammy Mayo MRN: [id number] Date of Birth: 05-02-1966 Referring Provider: Venita Lick MD.  Encounter Date: 08/28/2015      PT End of Session - 08/28/15 1247    Visit Number 5   Number of Visits 12   PT Start Time 1228   PT Stop Time 1317   PT Time Calculation (min) 49 min   Equipment Utilized During Treatment Back brace   Activity Tolerance Patient tolerated treatment well   Behavior During Therapy [facility] for tasks assessed/performed      Past Medical History  Diagnosis Date  . Hypertension   . Hyperlipidemia   . Vitamin D deficiency   . Diabetes mellitus without complication (HCC)   . Wears glasses   . Seasonal allergies   . GERD (gastroesophageal reflux disease)   . Arthritis   . 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 Tubal ligation    . Dilation and curettage of uterus    . Cesarean section      x 1  . Hernia repair      umbilical hernia repair as a child    There were no vitals filed for this visit.  Visit Diagnosis:  Right-sided low back pain with right-sided sciatica  Decreased range of motion of hip, right  Debility      Subjective Assessment - 08/28/15 1234    Subjective patient reported improvement with pain reduction thus far, yet still pain complaints with certain movements.   Limitations Sitting;Standing   How long can you sit comfortably? 20-30 minutes   How long can you stand comfortably? 10 minutes   How long can you walk comfortably? Short distances.   Patient Stated Goals Get out of pain and back to work.   Currently in Pain? Yes   Pain Score 4    Pain Location Back   Pain Orientation Right;Lower   Pain Descriptors / Indicators Aching   Pain Type Acute  pain   Pain Onset More than a month ago   Pain Frequency Constant   Aggravating Factors  any one position too long   Pain Relieving Factors rest                         [facility] Adult PT Treatment/Exercise - 08/28/15 0001    Lumbar Exercises: Aerobic   Stationary Bike Nustep L4 x38min   Lumbar Exercises: Supine   Ab Set 20 reps  30 sec   Bent Knee Raise 3 seconds;20 reps   Bridge 10 reps   Straight Leg Raise 3 seconds  2x10   Modalities   Modalities Ultrasound   Moist Heat Therapy   Number Minutes Moist Heat 15 Minutes   Moist Heat Location Lumbar Spine   Electrical Stimulation   Electrical Stimulation Location B low back   Electrical Stimulation Action premod   Electrical Stimulation Parameters 80-150hz    Electrical Stimulation Goals Pain   Ultrasound   Ultrasound Location rt low back   Ultrasound Parameters 1.5w/2/50%/66mhzx10min   Ultrasound Goals Pain                     PT Long Term Goals - 08/23/15 1429    PT LONG  TERM GOAL #1   Title Ind with a HEP.   Time 4   Period Weeks   Status Achieved   PT LONG TERM GOAL #2   Title Sit 30 minutes with pain not > 3/10.   Time 4   Period Weeks   Status On-going   PT LONG TERM GOAL #3   Title Stand 30 minutes with pain not > 3/10.   Time 4   Period Weeks   Status On-going   PT LONG TERM GOAL #4   Title Sleep 6 hours undisturbed.   Time 4   Period Weeks   Status On-going   PT LONG TERM GOAL #5   Title Perform ADL's with pain not > 3-4/10.   Time 4   Period Weeks   Status On-going   PT LONG TERM GOAL #6   Title Bilateral hip flexion= 115 degrees.   Time 4   Period Weeks   Status On-going  R hip flexion 58 deg (painful), L hip flexion 90 deg in supine 08/23/2015               Plan - 08/28/15 1306    Clinical Impression Statement patient continues to progress with activities. patient understands core strengthening and progression with low level supine draw ins. patient felt  better after treatment today. Unable to meet any further goals due to pain deficits.   Pt will benefit from skilled therapeutic intervention in order to improve on the following deficits Pain;Decreased activity tolerance;Decreased range of motion   Rehab Potential Excellent   Clinical Impairments Affecting Rehab Potential 9 weeks post-op 08/23/2015   PT Frequency 3x / week   PT Duration 4 weeks   PT Treatment/Interventions ADLs/Self Care Home Management;Electrical Stimulation;Moist Heat;Therapeutic exercise;Therapeutic activities;Ultrasound;Patient/family education;Manual techniques   PT Next Visit Plan Single knee to chest stretch; begin low-level draw-in and core exercises and cont ultrasound to right low back (MD Shon Baton 09/15/15)   Consulted and Agree with Plan of Care Patient        Problem List Patient Active Problem List   Diagnosis Date Noted  . Back pain 06/21/2015  . HTN (hypertension) 10/23/2010  . Vitamin D deficiency 10/23/2010  . Hyperlipemia 10/23/2010  . Plantar fasciitis 10/23/2010    Hermelinda Dellen, PTA 08/28/2015, 1:17 PM  [facility] [address] [place], [place], [address] Phone: [phone]   Fax:  [phone]  Name: Tammy Mayo MRN: [id number] Date of Birth: 1965/09/26

## 2015-08-30 ENCOUNTER — Ambulatory Visit: Payer: BLUE CROSS/BLUE SHIELD | Attending: Orthopedic Surgery | Admitting: Physical Therapy

## 2015-08-30 DIAGNOSIS — R5381 Other malaise: Secondary | ICD-10-CM | POA: Insufficient documentation

## 2015-08-30 DIAGNOSIS — M5441 Lumbago with sciatica, right side: Secondary | ICD-10-CM | POA: Insufficient documentation

## 2015-08-30 DIAGNOSIS — M24651 Ankylosis, right hip: Secondary | ICD-10-CM | POA: Insufficient documentation

## 2015-08-30 NOTE — Therapy (Signed)
[facility] [address] [place], [place], [address] Phone: [phone]   Fax:  [phone]  Physical Therapy Treatment  Patient Details  Name: Tammy Mayo MRN: [id number] Date of Birth: 29-Oct-1965 Referring Provider: Venita Lick MD.  Encounter Date: 08/30/2015      PT End of Session - 08/30/15 1438    Visit Number 6   Number of Visits 12   PT Start Time 0145   PT Stop Time 0235   PT Time Calculation (min) 50 min   Equipment Utilized During Treatment Back brace   Activity Tolerance Patient tolerated treatment well   Behavior During Therapy [facility] for tasks assessed/performed      Past Medical History  Diagnosis Date  . Hypertension   . Hyperlipidemia   . Vitamin D deficiency   . Diabetes mellitus without complication (HCC)   . Wears glasses   . Seasonal allergies   . GERD (gastroesophageal reflux disease)   . Arthritis   . 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 Tubal ligation    . Dilation and curettage of uterus    . Cesarean section      x 1  . Hernia repair      umbilical hernia repair as a child    There were no vitals filed for this visit.  Visit Diagnosis:  Right-sided low back pain with right-sided sciatica  Decreased range of motion of hip, right  Debility      Subjective Assessment - 08/30/15 1359    Subjective (p) Sore today with some right lateral thigh numbness reported.   Limitations (p) Sitting;Standing   How long can you sit comfortably? (p) 20-30 minutes   How long can you stand comfortably? (p) 10 minutes   How long can you walk comfortably? (p) Short distances.   Patient Stated Goals (p) Get out of pain and back to work.   Pain Score (p) 5    Pain Location (p) Back   Pain Orientation (p) Lower   Pain Descriptors / Indicators (p) Aching;Numbness   Pain Type (p) Acute pain   Pain Onset (p) More than  a month ago   Pain Frequency (p) Constant                         [facility] Adult PT Treatment/Exercise - 08/30/15 0001    Lumbar Exercises: Aerobic   Stationary Bike Nustep level 4 x 15 minutes.   Modalities   Modalities Ultrasound   Ultrasound   Ultrasound Location Left sdly position with pillow between knees for comfort:     Ultrasound Parameters 1.50 W/CM2 x 12 minutes to right low back.   Ultrasound Goals Pain   Manual Therapy   Manual therapy comments STW x 11 minutes.                     PT Long Term Goals - 08/23/15 1429    PT LONG TERM GOAL #1   Title Ind with a HEP.   Time 4   Period Weeks   Status Achieved   PT LONG TERM GOAL #2   Title Sit 30 minutes with pain not > 3/10.   Time 4   Period Weeks   Status On-going   PT LONG TERM GOAL #3   Title Stand 30 minutes with pain not >  3/10.   Time 4   Period Weeks   Status On-going   PT LONG TERM GOAL #4   Title Sleep 6 hours undisturbed.   Time 4   Period Weeks   Status On-going   PT LONG TERM GOAL #5   Title Perform ADL's with pain not > 3-4/10.   Time 4   Period Weeks   Status On-going   PT LONG TERM GOAL #6   Title Bilateral hip flexion= 115 degrees.   Time 4   Period Weeks   Status On-going  R hip flexion 58 deg (painful), L hip flexion 90 deg in supine 08/23/2015               Problem List Patient Active Problem List   Diagnosis Date Noted  . Back pain 06/21/2015  . HTN (hypertension) 10/23/2010  . Vitamin D deficiency 10/23/2010  . Hyperlipemia 10/23/2010  . Plantar fasciitis 10/23/2010    Carold Eisner, [place] MPT 08/30/2015, 2:42 PM  [facility] [address] [place], [place], [address] Phone: [phone]   Fax:  [phone]  Name: Tammy Mayo MRN: [id number] Date of Birth: 15-Nov-1965

## 2015-09-04 ENCOUNTER — Encounter: Payer: Self-pay | Admitting: Physical Therapy

## 2015-09-04 ENCOUNTER — Ambulatory Visit: Payer: BLUE CROSS/BLUE SHIELD | Admitting: Physical Therapy

## 2015-09-04 DIAGNOSIS — R5381 Other malaise: Secondary | ICD-10-CM

## 2015-09-04 DIAGNOSIS — M24651 Ankylosis, right hip: Secondary | ICD-10-CM

## 2015-09-04 DIAGNOSIS — M5441 Lumbago with sciatica, right side: Secondary | ICD-10-CM | POA: Diagnosis not present

## 2015-09-04 NOTE — Therapy (Signed)
[facility] [address] [place], [place], [address] Phone: [phone]   Fax:  [phone]  Physical Therapy Treatment  Patient Details  Name: LAKESHA LEVINSON MRN: [id number] Date of Birth: 08-01-1965 Referring Provider: Venita Lick MD.  Encounter Date: 09/04/2015      PT End of Session - 09/04/15 1348    Visit Number 7   Number of Visits 12   PT Start Time 1346   PT Stop Time 1432   PT Time Calculation (min) 46 min   Equipment Utilized During Treatment Back brace   Activity Tolerance Patient tolerated treatment well   Behavior During Therapy [facility] for tasks assessed/performed      Past Medical History  Diagnosis Date  . Hypertension   . Hyperlipidemia   . Vitamin D deficiency   . Diabetes mellitus without complication (HCC)   . Wears glasses   . Seasonal allergies   . GERD (gastroesophageal reflux disease)   . Arthritis   . 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 Tubal ligation    . Dilation and curettage of uterus    . Cesarean section      x 1  . Hernia repair      umbilical hernia repair as a child    There were no vitals filed for this visit.  Visit Diagnosis:  Right-sided low back pain with right-sided sciatica  Decreased range of motion of hip, right  Debility      Subjective Assessment - 09/04/15 1347    Subjective Reports she was aching all weekend in R low back. Reports still experiencing nausea.   Limitations Sitting;Standing   How long can you sit comfortably? 20-30 minutes   How long can you stand comfortably? 10 minutes   How long can you walk comfortably? Short distances.   Patient Stated Goals Get out of pain and back to work.   Currently in Pain? Yes   Pain Score 5    Pain Location Back   Pain Orientation Right;Lower   Pain Descriptors / Indicators Aching   Pain Onset More than a month ago             [facility] PT Assessment - 09/04/15 0001    Assessment   Medical Diagnosis L4-L5 lumbar fusion.   Onset Date/Surgical Date 06/20/16   Next MD Visit 09/15/2015   Precautions   Precautions Back   Required Braces or Orthoses Spinal Brace                     [facility] Adult PT Treatment/Exercise - 09/04/15 0001    Lumbar Exercises: Stretches   Single Knee to Chest Stretch 5 reps;30 seconds  RLE   Piriformis Stretch 3 reps;30 seconds;Other (comment)  RLE; supine figure 4 stretch   Lumbar Exercises: Aerobic   Stationary Bike Nustep level 5 x 12 minutes.   Lumbar Exercises: Standing   Row Strengthening;Both;20 reps;Other (comment)  Pink XTS   Lumbar Exercises: Supine   Bent Knee Raise 20 reps;3 seconds   Bridge 15 reps  4/10 low back pain reported   Straight Leg Raise 20 reps;3 seconds;Other (comment)  BLE   Modalities   Modalities Electrical Stimulation;Moist Heat   Moist Heat Therapy   Number Minutes Moist Heat 15 Minutes   Moist Heat Location Lumbar Spine   Electrical Stimulation   Electrical Stimulation Location R  low back    Electrical Stimulation Action Pre-Mod   Electrical Stimulation Parameters 80-150 Hz x15 min   Electrical Stimulation Goals Pain                     PT Long Term Goals - 08/23/15 1429    PT LONG TERM GOAL #1   Title Ind with a HEP.   Time 4   Period Weeks   Status Achieved   PT LONG TERM GOAL #2   Title Sit 30 minutes with pain not > 3/10.   Time 4   Period Weeks   Status On-going   PT LONG TERM GOAL #3   Title Stand 30 minutes with pain not > 3/10.   Time 4   Period Weeks   Status On-going   PT LONG TERM GOAL #4   Title Sleep 6 hours undisturbed.   Time 4   Period Weeks   Status On-going   PT LONG TERM GOAL #5   Title Perform ADL's with pain not > 3-4/10.   Time 4   Period Weeks   Status On-going   PT LONG TERM GOAL #6   Title Bilateral hip flexion= 115 degrees.   Time 4   Period Weeks   Status  On-going  R hip flexion 58 deg (painful), L hip flexion 90 deg in supine 08/23/2015               Plan - 09/04/15 1423    Clinical Impression Statement Patient continues to tolerate treatments fairly well although she has intermitant low back pain with exercises. All core exercises were completed with VCs for core activation to increase core strength and lumbar stabilization. Only exercise with complaint of pain or discomfort was bridging with 4/10 low back pain. Tolerated standing row with pink XTS well with no increase in pain or discomfort. Normal modalities response noted following removal of the modalities. Goals remain on-going secondary to pain experienced with activities, disturbed sleep and decreased B hip ROM. Experienced decreased tightness and 4/10 pain following today's treatment.   Pt will benefit from skilled therapeutic intervention in order to improve on the following deficits Pain;Decreased activity tolerance;Decreased range of motion   Rehab Potential Excellent   Clinical Impairments Affecting Rehab Potential 9 weeks post-op 08/23/2015   PT Frequency 3x / week   PT Duration 4 weeks   PT Treatment/Interventions ADLs/Self Care Home Management;Electrical Stimulation;Moist Heat;Therapeutic exercise;Therapeutic activities;Ultrasound;Patient/family education;Manual techniques   PT Next Visit Plan Continue core strengthening exercises and modalities PRN for pain.   Consulted and Agree with Plan of Care Patient        Problem List Patient Active Problem List   Diagnosis Date Noted  . Back pain 06/21/2015  . HTN (hypertension) 10/23/2010  . Vitamin D deficiency 10/23/2010  . Hyperlipemia 10/23/2010  . Plantar fasciitis 10/23/2010    Evelene Croon, PTA 09/04/2015, 3:13 PM  [facility] [address] [place], [place], [address] Phone: [phone]   Fax:  [phone]  Name: RHYLIN VENTERS MRN: [id number] Date of Birth:  09-25-1965

## 2015-09-06 ENCOUNTER — Ambulatory Visit: Payer: BLUE CROSS/BLUE SHIELD | Admitting: Physical Therapy

## 2015-09-06 ENCOUNTER — Encounter: Payer: Self-pay | Admitting: Physical Therapy

## 2015-09-06 DIAGNOSIS — M24651 Ankylosis, right hip: Secondary | ICD-10-CM

## 2015-09-06 DIAGNOSIS — R5381 Other malaise: Secondary | ICD-10-CM

## 2015-09-06 DIAGNOSIS — M5441 Lumbago with sciatica, right side: Secondary | ICD-10-CM | POA: Diagnosis not present

## 2015-09-06 NOTE — Therapy (Signed)
[facility] [address] [place], [place], [address] Phone: [phone]   Fax:  [phone]  Physical Therapy Treatment  Patient Details  Name: Tammy Mayo MRN: [id number] Date of Birth: 1966-06-10 Referring Provider: Venita Lick MD.  Encounter Date: 09/06/2015      PT End of Session - 09/06/15 1346    Visit Number 8   Number of Visits 12   Date for PT Re-Evaluation 09/25/15   PT Start Time 1344   PT Stop Time 1430   PT Time Calculation (min) 46 min   Equipment Utilized During Treatment Back brace   Activity Tolerance Patient tolerated treatment well   Behavior During Therapy [facility] for tasks assessed/performed      Past Medical History  Diagnosis Date  . Hypertension   . Hyperlipidemia   . Vitamin D deficiency   . Diabetes mellitus without complication (HCC)   . Wears glasses   . Seasonal allergies   . GERD (gastroesophageal reflux disease)   . Arthritis   . 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 Tubal ligation    . Dilation and curettage of uterus    . Cesarean section      x 1  . Hernia repair      umbilical hernia repair as a child    There were no vitals filed for this visit.  Visit Diagnosis:  Right-sided low back pain with right-sided sciatica  Decreased range of motion of hip, right  Debility      Subjective Assessment - 09/06/15 1345    Subjective Reports that she has a MD appt with Dr. Charm Barges at 3:45 pm due to nauseated feeling. States that she is not feeling any sensations or pain into her LEs today.   Limitations Sitting;Standing   How long can you sit comfortably? 20-30 minutes   How long can you stand comfortably? 10 minutes   How long can you walk comfortably? Short distances.   Patient Stated Goals Get out of pain and back to work.   Currently in Pain? Yes   Pain Score 3    Pain Location Back   Pain  Orientation Lower   Pain Descriptors / Indicators Aching   Pain Onset More than a month ago            [facility] PT Assessment - 09/06/15 0001    Assessment   Medical Diagnosis L4-L5 lumbar fusion.   Onset Date/Surgical Date 06/20/16   Next MD Visit 09/15/2015   Precautions   Precautions Back   Required Braces or Orthoses Spinal Brace                     [facility] Adult PT Treatment/Exercise - 09/06/15 0001    Lumbar Exercises: Stretches   Single Knee to Chest Stretch 3 reps;30 seconds  RLE   Piriformis Stretch 3 reps;30 seconds  RLE; figure 4 stretch   Lumbar Exercises: Aerobic   Stationary Bike Nustep level 5 x 12 minutes.   Lumbar Exercises: Standing   Row Strengthening;Both  3x10 reps   Row Limitations Pink XTS   Shoulder Extension Strengthening;Both  3x10 reps   Shoulder Extension Limitations Pink XTS   Lumbar Exercises: Supine   Bent Knee Raise 20 reps;3 seconds   Bridge 10 reps;Other (comment)  3.5/10 R low back pain reported   Modalities   Modalities Electrical  Stimulation;Moist Heat   Moist Heat Therapy   Number Minutes Moist Heat 15 Minutes   Moist Heat Location Lumbar Spine   Electrical Stimulation   Electrical Stimulation Location R low back    Electrical Stimulation Action Pre-Mod   Electrical Stimulation Parameters 80-150 Hz x15 min   Electrical Stimulation Goals Pain                PT Education - 09/06/15 1411    Education provided Yes   Education Details HEP- SKTC and figure 4 piriformis stretch   Person(s) Educated Patient   Methods Explanation;Demonstration;Verbal cues;Handout   Comprehension Verbalized understanding;Returned demonstration;Verbal cues required             PT Long Term Goals - 08/23/15 1429    PT LONG TERM GOAL #1   Title Ind with a HEP.   Time 4   Period Weeks   Status Achieved   PT LONG TERM GOAL #2   Title Sit 30 minutes with pain not > 3/10.   Time 4   Period Weeks   Status On-going   PT LONG  TERM GOAL #3   Title Stand 30 minutes with pain not > 3/10.   Time 4   Period Weeks   Status On-going   PT LONG TERM GOAL #4   Title Sleep 6 hours undisturbed.   Time 4   Period Weeks   Status On-going   PT LONG TERM GOAL #5   Title Perform ADL's with pain not > 3-4/10.   Time 4   Period Weeks   Status On-going   PT LONG TERM GOAL #6   Title Bilateral hip flexion= 115 degrees.   Time 4   Period Weeks   Status On-going  R hip flexion 58 deg (painful), L hip flexion 90 deg in supine 08/23/2015               Plan - 09/06/15 1419    Clinical Impression Statement Patient continues to tolerate treatments well although she presents in clinic with R low back pain and only reports low back pain with bridge exercise. Patient noted that shoulder extension with core activation was more difficult for her than shoulder rows with core activation. Patient continues to report pain in only R low back so stretches were again conducted in efforts to decrease tension in R low back musculature. Patient accepted stretching HEP without questions and verbalized understanding of the instructions. Normal modalities response noted following removal of the modalities. Patient experienced decreased tightness following today's treatment but gave no numerical pain rating if low back pain was present.   Pt will benefit from skilled therapeutic intervention in order to improve on the following deficits Pain;Decreased activity tolerance;Decreased range of motion   Rehab Potential Excellent   Clinical Impairments Affecting Rehab Potential 11 weeks post-op 09/06/2015   PT Frequency 3x / week   PT Duration 4 weeks   PT Treatment/Interventions ADLs/Self Care Home Management;Electrical Stimulation;Moist Heat;Therapeutic exercise;Therapeutic activities;Ultrasound;Patient/family education;Manual techniques   PT Next Visit Plan Continue core strengthening exercises and modalities PRN for pain.   PT Home Exercise Plan  SKTC, Piriformis figure 4 stretch   Consulted and Agree with Plan of Care Patient        Problem List Patient Active Problem List   Diagnosis Date Noted  . Back pain 06/21/2015  . HTN (hypertension) 10/23/2010  . Vitamin D deficiency 10/23/2010  . Hyperlipemia 10/23/2010  . Plantar fasciitis 10/23/2010    Evelene Croon,  PTA 09/06/2015, 3:09 PM  [facility] [address] [place], [place], [address] Phone: [phone]   Fax:  [phone]  Name: VIRGA HALTIWANGER MRN: [id number] Date of Birth: 03-Oct-1965

## 2015-09-06 NOTE — Patient Instructions (Signed)
Stretching: Piriformis    Lay on your back with your left knee bent. Cross your right ankle over your left knee and hold stretch. Hold _30___ seconds. Repeat _3___ times per set. Do __1__ sets per session. Do _2-3___ sessions per day.  http://orth.exer.us/650   Copyright  VHI. All rights reserved.  Knee-to-Chest Stretch: Unilateral    With hand behind right knee, pull knee in to chest until a comfortable stretch is felt in lower back and buttocks. Keep back relaxed. Hold __30__ seconds. Repeat _3___ times per set. Do __1__ sets per session. Do __2-3__ sessions per day.  http://orth.exer.us/126   Copyright  VHI. All rights reserved.

## 2015-09-11 ENCOUNTER — Ambulatory Visit: Payer: BLUE CROSS/BLUE SHIELD | Admitting: *Deleted

## 2015-09-11 DIAGNOSIS — M5441 Lumbago with sciatica, right side: Secondary | ICD-10-CM | POA: Diagnosis not present

## 2015-09-11 DIAGNOSIS — R5381 Other malaise: Secondary | ICD-10-CM

## 2015-09-11 DIAGNOSIS — M24651 Ankylosis, right hip: Secondary | ICD-10-CM

## 2015-09-11 NOTE — Therapy (Signed)
[facility] [address] [place], [place], [address] Phone: [phone]   Fax:  [phone]  Physical Therapy Treatment  Patient Details  Name: Tammy Mayo MRN: [id number] Date of Birth: [birth year] Referring Provider: Venita Lick MD.  Encounter Date: 09/11/2015      PT End of Session - 09/11/15 1351    Visit Number 9   Number of Visits 12   Date for PT Re-Evaluation 09/25/15   PT Start Time 1347   PT Stop Time 1438   PT Time Calculation (min) 51 min      Past Medical History  Diagnosis Date  . Hypertension   . Hyperlipidemia   . Vitamin D deficiency   . Diabetes mellitus without complication (HCC)   . Wears glasses   . Seasonal allergies   . GERD (gastroesophageal reflux disease)   . Arthritis   . 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 Tubal ligation    . Dilation and curettage of uterus    . Cesarean section      x 1  . Hernia repair      umbilical hernia repair as a child    There were no vitals filed for this visit.  Visit Diagnosis:  Right-sided low back pain with right-sided sciatica  Decreased range of motion of hip, right  Debility      Subjective Assessment - 09/11/15 1356    Subjective LBP and soreness 4/10 today. Still getting some numbness around RT knee.   Limitations Sitting;Standing   How long can you sit comfortably? 20-30 minutes   How long can you stand comfortably? 10 minutes   How long can you walk comfortably? Short distances.   Patient Stated Goals Get out of pain and back to work.   Currently in Pain? Yes   Pain Score 4    Pain Location Back   Pain Orientation Lower   Pain Descriptors / Indicators Aching   Pain Type Acute pain   Pain Onset More than a month ago   Pain Frequency Constant                         [facility] Adult PT Treatment/Exercise - 09/11/15 0001    Lumbar  Exercises: Stretches   Single Knee to Chest Stretch 3 reps;30 seconds  RLE   Piriformis Stretch 3 reps;30 seconds  RLE; figure 4 stretch   Lumbar Exercises: Aerobic   Stationary Bike Nustep level 5 x 15 minutes.   Lumbar Exercises: Supine   Ab Set 20 reps;5 seconds  Drawin   Clam 10 reps;20 reps;3 seconds  1x10 each Leg, and 1x10 both   Bent Knee Raise 20 reps;3 seconds   Bridge Other (comment);5 reps  3.5/10 R low back pain reported   Straight Leg Raise 20 reps;3 seconds;Other (comment)  BLE   Modalities   Modalities Electrical Stimulation;Moist Heat   Moist Heat Therapy   Number Minutes Moist Heat 15 Minutes   Moist Heat Location Lumbar Spine   Electrical Stimulation   Electrical Stimulation Location R low back Premod 80-150hz  x 15 mins   Electrical Stimulation Goals Pain                     PT Long Term Goals - 08/23/15 1429    PT LONG TERM GOAL #1   Title  Ind with a HEP.   Time 4   Period Weeks   Status Achieved   PT LONG TERM GOAL #2   Title Sit 30 minutes with pain not > 3/10.   Time 4   Period Weeks   Status On-going   PT LONG TERM GOAL #3   Title Stand 30 minutes with pain not > 3/10.   Time 4   Period Weeks   Status On-going   PT LONG TERM GOAL #4   Title Sleep 6 hours undisturbed.   Time 4   Period Weeks   Status On-going   PT LONG TERM GOAL #5   Title Perform ADL's with pain not > 3-4/10.   Time 4   Period Weeks   Status On-going   PT LONG TERM GOAL #6   Title Bilateral hip flexion= 115 degrees.   Time 4   Period Weeks   Status On-going  R hip flexion 58 deg (painful), L hip flexion 90 deg in supine 08/23/2015               Plan - 09/11/15 1403    Clinical Impression Statement Pt did fairly well with Rx today. Today's Rx focused on core activation ex's to make sure Pt was able to really get a good TA activation while performing the other Act's. She did better with her breathing and was able to perform without holding her  breath. Postures and bodymechanics were also reviewed for standing act.'s such as brushing your teeth and washing dishes.   Pt will benefit from skilled therapeutic intervention in order to improve on the following deficits Pain;Decreased activity tolerance;Decreased range of motion   Clinical Impairments Affecting Rehab Potential 11 weeks post-op 09/06/2015   PT Frequency 3x / week   PT Duration 4 weeks   PT Treatment/Interventions ADLs/Self Care Home Management;Electrical Stimulation;Moist Heat;Therapeutic exercise;Therapeutic activities;Ultrasound;Patient/family education;Manual techniques   PT Next Visit Plan Continue core strengthening exercises and modalities PRN for pain.   PT Home Exercise Plan SKTC, Piriformis figure 4 stretch   Consulted and Agree with Plan of Care Patient        Problem List Patient Active Problem List   Diagnosis Date Noted  . Back pain 06/21/2015  . HTN (hypertension) 10/23/2010  . Vitamin D deficiency 10/23/2010  . Hyperlipemia 10/23/2010  . Plantar fasciitis 10/23/2010    RAMSEUR,CHRIS , PTA  09/11/2015, 3:48 PM  [facility] [address] [place], [place], [address] Phone: [phone]   Fax:  [phone]  Name: Tammy Mayo MRN: [id number] Date of Birth: 06/18/1966

## 2015-09-13 ENCOUNTER — Ambulatory Visit: Payer: BLUE CROSS/BLUE SHIELD | Admitting: Physical Therapy

## 2015-09-13 ENCOUNTER — Encounter: Payer: Self-pay | Admitting: Physical Therapy

## 2015-09-13 DIAGNOSIS — M5441 Lumbago with sciatica, right side: Secondary | ICD-10-CM | POA: Diagnosis not present

## 2015-09-13 DIAGNOSIS — R5381 Other malaise: Secondary | ICD-10-CM

## 2015-09-13 DIAGNOSIS — M24651 Ankylosis, right hip: Secondary | ICD-10-CM

## 2015-09-13 NOTE — Therapy (Signed)
[facility] [address] [place], [place], [address] Phone: [phone]   Fax:  [phone]  Physical Therapy Treatment  Patient Details  Name: Tammy Mayo MRN: [id number] Date of Birth: 04/02/1966 Referring Provider: Venita Lick MD.  Encounter Date: 09/13/2015      PT End of Session - 09/13/15 1347    Visit Number 10   Number of Visits 12   Date for PT Re-Evaluation 09/25/15   PT Start Time 1345   PT Stop Time 1434   PT Time Calculation (min) 49 min   Equipment Utilized During Treatment Back brace   Activity Tolerance Patient tolerated treatment well   Behavior During Therapy [facility] for tasks assessed/performed      Past Medical History  Diagnosis Date  . Hypertension   . Hyperlipidemia   . Vitamin D deficiency   . Diabetes mellitus without complication (HCC)   . Wears glasses   . Seasonal allergies   . GERD (gastroesophageal reflux disease)   . Arthritis   . 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 Tubal ligation    . Dilation and curettage of uterus    . Cesarean section      x 1  . Hernia repair      umbilical hernia repair as a child    There were no vitals filed for this visit.  Visit Diagnosis:  Right-sided low back pain with right-sided sciatica  Decreased range of motion of hip, right  Debility      Subjective Assessment - 09/13/15 1345    Subjective Reports that her R low back is hurting today. States that PCP gave her a medical for her nausea from an unidentified cause. Reports that she is stil experiencing numbness from mid R thigh to mid R lower leg.   Limitations Sitting;Standing   How long can you sit comfortably? 20-30 minutes   How long can you stand comfortably? 10 minutes   How long can you walk comfortably? Short distances.   Patient Stated Goals Get out of pain and back to work.   Currently in Pain?  Yes   Pain Score 4    Pain Location Back   Pain Orientation Right;Lower   Pain Descriptors / Indicators Aching   Pain Type Acute pain   Pain Onset More than a month ago            [facility] PT Assessment - 09/13/15 0001    Assessment   Medical Diagnosis L4-L5 lumbar fusion.   Onset Date/Surgical Date 06/20/16   Next MD Visit 09/15/2015   Precautions   Precautions Back   Required Braces or Orthoses Spinal Brace                     [facility] Adult PT Treatment/Exercise - 09/13/15 0001    Lumbar Exercises: Stretches   Single Knee to Chest Stretch 3 reps;30 seconds  RLE   Piriformis Stretch 3 reps;30 seconds  RLE   Lumbar Exercises: Aerobic   Stationary Bike Nustep level 5 x 15 minutes.   Lumbar Exercises: Standing   Row Strengthening;Both  3x10 reps   Row Limitations Pink XTS   Shoulder Extension Strengthening;Both  3x10 reps   Shoulder Extension Limitations Pink XTS   Lumbar Exercises: Supine   Clam 20 reps  Reported 3/10 low back pain with exercise   Bent Knee  Raise 20 reps;3 seconds  Reported 3/10 R LBP with exercise   Straight Leg Raise 10 reps;Other (comment)  BLE; No pain with LLE, 3/10 R LBP with RLE   Modalities   Modalities Electrical Stimulation;Moist Heat   Moist Heat Therapy   Number Minutes Moist Heat 15 Minutes   Moist Heat Location Lumbar Spine   Electrical Stimulation   Electrical Stimulation Location R low back   Electrical Stimulation Action Pre-Mod   Electrical Stimulation Parameters 80-150 Hz x15 min   Electrical Stimulation Goals Pain                     PT Long Term Goals - 08/23/15 1429    PT LONG TERM GOAL #1   Title Ind with a HEP.   Time 4   Period Weeks   Status Achieved   PT LONG TERM GOAL #2   Title Sit 30 minutes with pain not > 3/10.   Time 4   Period Weeks   Status On-going   PT LONG TERM GOAL #3   Title Stand 30 minutes with pain not > 3/10.   Time 4   Period Weeks   Status On-going   PT LONG TERM  GOAL #4   Title Sleep 6 hours undisturbed.   Time 4   Period Weeks   Status On-going   PT LONG TERM GOAL #5   Title Perform ADL's with pain not > 3-4/10.   Time 4   Period Weeks   Status On-going   PT LONG TERM GOAL #6   Title Bilateral hip flexion= 115 degrees.   Time 4   Period Weeks   Status On-going  R hip flexion 58 deg (painful), L hip flexion 90 deg in supine 08/23/2015               Plan - 09/13/15 1422    Clinical Impression Statement Patient continues to tolerate treatments fairly well although the pain she experiences now mirrors that that she experienced prior to surgery per patient report. Patient mostly notes pain with exercises that require RLE involvement. Patient is compliant with back brace given by surgeon's office. Patient not able to achieve ADL, sitting or standing for 30 minutes goals due to pain. Patient completes all exercises with VCs for core activation to improve lumbar stabilization. Normal modalties response noted following removal of the modalities.   Pt will benefit from skilled therapeutic intervention in order to improve on the following deficits Pain;Decreased activity tolerance;Decreased range of motion   Rehab Potential Excellent   Clinical Impairments Affecting Rehab Potential 12 weeks post-op 09/13/2015   PT Frequency 3x / week   PT Duration 4 weeks   PT Treatment/Interventions ADLs/Self Care Home Management;Electrical Stimulation;Moist Heat;Therapeutic exercise;Therapeutic activities;Ultrasound;Patient/family education;Manual techniques   PT Next Visit Plan Continue core strengthening exercises and modalities PRN for pain. Patient sees Dr. Shon Baton 09/15/2015.   PT Home Exercise Plan SKTC, Piriformis figure 4 stretch   Consulted and Agree with Plan of Care Patient        Problem List Patient Active Problem List   Diagnosis Date Noted  . Back pain 06/21/2015  . HTN (hypertension) 10/23/2010  . Vitamin D deficiency 10/23/2010  .  Hyperlipemia 10/23/2010  . Plantar fasciitis 10/23/2010    Florence Canner, PTA 09/13/2015 5:39 PM [place] MPT [facility] [address] [place], [place], [address] Phone: [phone]   Fax:  [phone]  Name: Tammy Mayo MRN: [id number] Date  of Birth: 1965/08/02

## 2015-10-16 ENCOUNTER — Other Ambulatory Visit: Payer: Self-pay | Admitting: General Surgery

## 2015-11-06 ENCOUNTER — Encounter (HOSPITAL_COMMUNITY): Payer: Self-pay

## 2015-11-06 ENCOUNTER — Encounter (HOSPITAL_COMMUNITY)
Admission: RE | Admit: 2015-11-06 | Discharge: 2015-11-06 | Disposition: A | Discharge status: Home / Self Care | Payer: BLUE CROSS/BLUE SHIELD | Source: Ambulatory Visit | Attending: General Surgery | Admitting: General Surgery

## 2015-11-06 DIAGNOSIS — E119 Type 2 diabetes mellitus without complications: Secondary | ICD-10-CM | POA: Diagnosis not present

## 2015-11-06 DIAGNOSIS — E785 Hyperlipidemia, unspecified: Secondary | ICD-10-CM | POA: Diagnosis not present

## 2015-11-06 DIAGNOSIS — K219 Gastro-esophageal reflux disease without esophagitis: Secondary | ICD-10-CM | POA: Diagnosis not present

## 2015-11-06 DIAGNOSIS — Z79899 Other long term (current) drug therapy: Secondary | ICD-10-CM | POA: Insufficient documentation

## 2015-11-06 DIAGNOSIS — Z7984 Long term (current) use of oral hypoglycemic drugs: Secondary | ICD-10-CM | POA: Diagnosis not present

## 2015-11-06 DIAGNOSIS — Z01812 Encounter for preprocedural laboratory examination: Secondary | ICD-10-CM | POA: Diagnosis not present

## 2015-11-06 DIAGNOSIS — I10 Essential (primary) hypertension: Secondary | ICD-10-CM | POA: Diagnosis not present

## 2015-11-06 DIAGNOSIS — Z01818 Encounter for other preprocedural examination: Secondary | ICD-10-CM | POA: Insufficient documentation

## 2015-11-06 DIAGNOSIS — K8020 Calculus of gallbladder without cholecystitis without obstruction: Secondary | ICD-10-CM | POA: Diagnosis not present

## 2015-11-06 DIAGNOSIS — Z981 Arthrodesis status: Secondary | ICD-10-CM | POA: Insufficient documentation

## 2015-11-06 HISTORY — DX: Anemia, unspecified: D64.9

## 2015-11-06 LAB — CBC WITH DIFFERENTIAL/PLATELET
Basophils Absolute: 0 10*3/uL (ref 0.0–0.1)
Basophils Relative: 1 %
Eosinophils Absolute: 0.1 10*3/uL (ref 0.0–0.7)
Eosinophils Relative: 1 %
HEMATOCRIT: 38.8 % (ref 36.0–46.0)
Hemoglobin: 13 g/dL (ref 12.0–15.0)
LYMPHS PCT: 39 %
Lymphs Abs: 2.5 10*3/uL (ref 0.7–4.0)
MCH: 29.5 pg (ref 26.0–34.0)
MCHC: 33.5 g/dL (ref 30.0–36.0)
MCV: 88 fL (ref 78.0–100.0)
MONO ABS: 0.5 10*3/uL (ref 0.1–1.0)
MONOS PCT: 8 %
NEUTROS ABS: 3.2 10*3/uL (ref 1.7–7.7)
Neutrophils Relative %: 51 %
Platelets: 352 10*3/uL (ref 150–400)
RBC: 4.41 MIL/uL (ref 3.87–5.11)
RDW: 13.2 % (ref 11.5–15.5)
WBC: 6.3 10*3/uL (ref 4.0–10.5)

## 2015-11-06 LAB — COMPREHENSIVE METABOLIC PANEL
ALT: 20 U/L (ref 14–54)
AST: 27 U/L (ref 15–41)
Albumin: 3.8 g/dL (ref 3.5–5.0)
Alkaline Phosphatase: 66 U/L (ref 38–126)
Anion gap: 13 (ref 5–15)
BILIRUBIN TOTAL: 0.7 mg/dL (ref 0.3–1.2)
BUN: 6 mg/dL (ref 6–20)
CHLORIDE: 103 mmol/L (ref 101–111)
CO2: 25 mmol/L (ref 22–32)
CREATININE: 0.79 mg/dL (ref 0.44–1.00)
Calcium: 9.5 mg/dL (ref 8.9–10.3)
GFR calc Af Amer: >60 mL/min (ref >60)
GLUCOSE: 106 mg/dL — AB (ref 65–99)
Potassium: 3.6 mmol/L (ref 3.5–5.1)
Sodium: 141 mmol/L (ref 135–145)
Total Protein: 7.1 g/dL (ref 6.5–8.1)

## 2015-11-06 LAB — GLUCOSE, CAPILLARY: GLUCOSE-CAPILLARY: 121 mg/dL — AB (ref 65–99)

## 2015-11-06 NOTE — Pre-Procedure Instructions (Signed)
    Hassan Bucklerlizabeth A Mailhot  11/06/2015      [facility] [place] - [address] [address] Phone: [phone] Fax: [phone]    Your procedure is scheduled on 11/13/15.  [facility].M.  Call this number if you have problems the morning of surgery:  [phone]   Remember:  Do not eat food or drink liquids after midnight.  Take these medicines the morning of surgery with A SIP OF WATER --estrace,robaxin,percocet,ultram   Do not wear jewelry, make-up or nail polish.  Do not wear lotions, powders, or perfumes.  You may wear deodorant.  Do not shave 48 hours prior to surgery.  Men may shave face and neck.  Do not bring valuables to the hospital.  [facility] is not responsible for any belongings or valuables.  Contacts, dentures or bridgework may not be worn into surgery.  Leave your suitcase in the car.  After surgery it may be brought to your room.  For patients admitted to the hospital, discharge time will be determined by your treatment team.  Patients discharged the day of surgery will not be allowed to drive home.   Name and phone number of your driver:   Special instructions:   Please read over the following fact sheets that you were given. Pain Booklet, Coughing and Deep Breathing and Surgical Site Infection Prevention

## 2015-11-07 LAB — HEMOGLOBIN A1C
Hgb A1c MFr Bld: 7.3 % — ABNORMAL HIGH (ref 4.8–5.6)
Mean Plasma Glucose: 163 mg/dL

## 2015-11-07 NOTE — Progress Notes (Signed)
Anesthesia Chart Review: Patient is a 50 year old female scheduled for laparoscopic cholecystectomy on 11/13/15 by Dr. Dwain SarnaWakefield.  History includes HTN, DM2, never smoker, HLD, GERD, anemia, arthritis, umbilical hernia repair, L4-5 TLIF 06/21/15. BMI consistent with obesity. PCP is Dr. Charm BargesButler.   Meds include dapagliflozin-metformin, estradiol, HCTZ, lisinopril, Provera, Amitiza, Provera, promethazine, Crestor, tramadol.   06/20/15 EKG: NSR, low voltage QRS, non-specific T wave abnormality. Q wave in III. Baseline artifact worse in I, aVL, III, aVF. No EKG at Dr. Silvana NewnessButler's office. Denied any prior cardiac testing. I evaluated patient prior to her TLIF 05/2015. She had limited activity due to back and RLE pain, but denied CP, SOB, edema, and syncope then.  06/23/15 1V CXR (post-TLIF): IMPRESSION: 1. Low lung volumes and minimal left basilar airspace disease likely reflects atelectasis. No significant consolidated airspace disease.  Preoperative labs noted. A1c 7.3. Will need to clarify if she needs a pre-operative pregnancy test. I have asked nursing staff to follow-up on this. She has a history of tubal ligation, but a pregnancy test was done prior to her TLIF. Could be done as POC testing on the day of surgery if indicated.  She tolerated TLIF less than six months ago. If no acute changes then I anticipate that she can proceed as planned.  Tammy Ochsllison Zelenak, PA-C [facility]iology Phone [phone] 11/07/2015 2:50 PM

## 2015-11-10 MED ORDER — CEFOTETAN DISODIUM-DEXTROSE 2-2.08 GM-% IV SOLR
2.0000 g | INTRAVENOUS | Status: AC
  Administered 2015-11-13: 2 g via INTRAVENOUS
  Filled 2015-11-10: qty 50

## 2015-11-13 ENCOUNTER — Encounter (HOSPITAL_COMMUNITY)
Admission: RE | Disposition: A | Discharge status: Home / Self Care | Payer: Self-pay | Source: Ambulatory Visit | Attending: General Surgery

## 2015-11-13 ENCOUNTER — Encounter (HOSPITAL_COMMUNITY): Payer: Self-pay | Admitting: *Deleted

## 2015-11-13 ENCOUNTER — Ambulatory Visit (HOSPITAL_COMMUNITY): Payer: BLUE CROSS/BLUE SHIELD | Admitting: Vascular Surgery

## 2015-11-13 ENCOUNTER — Ambulatory Visit (HOSPITAL_COMMUNITY): Payer: BLUE CROSS/BLUE SHIELD | Admitting: Certified Registered"

## 2015-11-13 ENCOUNTER — Ambulatory Visit (HOSPITAL_COMMUNITY)
Admission: RE | Admit: 2015-11-13 | Discharge: 2015-11-13 | Disposition: A | Discharge status: Home / Self Care | Payer: BLUE CROSS/BLUE SHIELD | Source: Ambulatory Visit | Attending: General Surgery | Admitting: General Surgery

## 2015-11-13 DIAGNOSIS — E119 Type 2 diabetes mellitus without complications: Secondary | ICD-10-CM | POA: Insufficient documentation

## 2015-11-13 DIAGNOSIS — I10 Essential (primary) hypertension: Secondary | ICD-10-CM | POA: Insufficient documentation

## 2015-11-13 DIAGNOSIS — K8020 Calculus of gallbladder without cholecystitis without obstruction: Secondary | ICD-10-CM | POA: Diagnosis present

## 2015-11-13 DIAGNOSIS — Z79899 Other long term (current) drug therapy: Secondary | ICD-10-CM | POA: Insufficient documentation

## 2015-11-13 DIAGNOSIS — K8010 Calculus of gallbladder with chronic cholecystitis without obstruction: Secondary | ICD-10-CM | POA: Insufficient documentation

## 2015-11-13 DIAGNOSIS — E7800 Pure hypercholesterolemia, unspecified: Secondary | ICD-10-CM | POA: Insufficient documentation

## 2015-11-13 DIAGNOSIS — Z79890 Hormone replacement therapy (postmenopausal): Secondary | ICD-10-CM | POA: Diagnosis not present

## 2015-11-13 DIAGNOSIS — Z7984 Long term (current) use of oral hypoglycemic drugs: Secondary | ICD-10-CM | POA: Insufficient documentation

## 2015-11-13 HISTORY — PX: CHOLECYSTECTOMY: SHX55

## 2015-11-13 LAB — GLUCOSE, CAPILLARY
GLUCOSE-CAPILLARY: 91 mg/dL (ref 65–99)
Glucose-Capillary: 99 mg/dL (ref 65–99)

## 2015-11-13 SURGERY — LAPAROSCOPIC CHOLECYSTECTOMY
Anesthesia: General | Site: Abdomen

## 2015-11-13 MED ORDER — ROCURONIUM BROMIDE 50 MG/5ML IV SOLN
INTRAVENOUS | Status: AC
Start: 2015-11-13 — End: 2015-11-13
  Filled 2015-11-13: qty 1

## 2015-11-13 MED ORDER — NEOSTIGMINE METHYLSULFATE 10 MG/10ML IV SOLN
INTRAVENOUS | Status: DC | PRN
  Administered 2015-11-13: 3 mg via INTRAVENOUS

## 2015-11-13 MED ORDER — LACTATED RINGERS IV SOLN
INTRAVENOUS | Status: DC
  Administered 2015-11-13 (×2): via INTRAVENOUS

## 2015-11-13 MED ORDER — OXYCODONE HCL 5 MG PO TABS: 5.0000 mg | ORAL_TABLET | Freq: Once | ORAL | Status: DC | PRN

## 2015-11-13 MED ORDER — SODIUM CHLORIDE 0.9 % IR SOLN
Status: DC | PRN
  Administered 2015-11-13: 1000 mL

## 2015-11-13 MED ORDER — BUPIVACAINE-EPINEPHRINE 0.25% -1:200000 IJ SOLN
INTRAMUSCULAR | Status: DC | PRN
  Administered 2015-11-13: 12 mL

## 2015-11-13 MED ORDER — PROPOFOL 10 MG/ML IV BOLUS
INTRAVENOUS | Status: AC
  Filled 2015-11-13: qty 20

## 2015-11-13 MED ORDER — ESMOLOL HCL 100 MG/10ML IV SOLN
INTRAVENOUS | Status: DC | PRN
  Administered 2015-11-13: 20 mg via INTRAVENOUS

## 2015-11-13 MED ORDER — GLYCOPYRROLATE 0.2 MG/ML IJ SOLN
INTRAMUSCULAR | Status: DC | PRN
  Administered 2015-11-13: 0.4 mg via INTRAVENOUS

## 2015-11-13 MED ORDER — FENTANYL CITRATE (PF) 100 MCG/2ML IJ SOLN
INTRAMUSCULAR | Status: DC | PRN
Start: 2015-11-13 — End: 2015-11-13
  Administered 2015-11-13 (×2): 50 ug via INTRAVENOUS
  Administered 2015-11-13: 150 ug via INTRAVENOUS

## 2015-11-13 MED ORDER — ACETAMINOPHEN 325 MG PO TABS: 325.0000 mg | ORAL_TABLET | ORAL | Status: DC | PRN

## 2015-11-13 MED ORDER — LIDOCAINE HCL (CARDIAC) 20 MG/ML IV SOLN
INTRAVENOUS | Status: AC
  Filled 2015-11-13: qty 5

## 2015-11-13 MED ORDER — OXYCODONE HCL 5 MG/5ML PO SOLN: 5.0000 mg | Freq: Once | ORAL | Status: DC | PRN

## 2015-11-13 MED ORDER — FENTANYL CITRATE (PF) 100 MCG/2ML IJ SOLN
25.0000 ug | INTRAMUSCULAR | Status: DC | PRN
  Administered 2015-11-13 (×4): 25 ug via INTRAVENOUS

## 2015-11-13 MED ORDER — ACETAMINOPHEN 160 MG/5ML PO SOLN: 325.0000 mg | ORAL | Status: DC | PRN

## 2015-11-13 MED ORDER — PROPOFOL 10 MG/ML IV BOLUS
INTRAVENOUS | Status: DC | PRN
  Administered 2015-11-13: 140 mg via INTRAVENOUS

## 2015-11-13 MED ORDER — MIDAZOLAM HCL 2 MG/2ML IJ SOLN
INTRAMUSCULAR | Status: AC
  Filled 2015-11-13: qty 2

## 2015-11-13 MED ORDER — MIDAZOLAM HCL 5 MG/5ML IJ SOLN
INTRAMUSCULAR | Status: DC | PRN
  Administered 2015-11-13: 2 mg via INTRAVENOUS

## 2015-11-13 MED ORDER — LIDOCAINE HCL (CARDIAC) 20 MG/ML IV SOLN
INTRAVENOUS | Status: DC | PRN
  Administered 2015-11-13: 60 mg via INTRAVENOUS

## 2015-11-13 MED ORDER — ONDANSETRON HCL 4 MG/2ML IJ SOLN
INTRAMUSCULAR | Status: AC
  Filled 2015-11-13: qty 2

## 2015-11-13 MED ORDER — ROCURONIUM BROMIDE 100 MG/10ML IV SOLN
INTRAVENOUS | Status: DC | PRN
  Administered 2015-11-13: 40 mg via INTRAVENOUS

## 2015-11-13 MED ORDER — NEOSTIGMINE METHYLSULFATE 10 MG/10ML IV SOLN
INTRAVENOUS | Status: AC
  Filled 2015-11-13: qty 1

## 2015-11-13 MED ORDER — ONDANSETRON HCL 4 MG/2ML IJ SOLN
INTRAMUSCULAR | Status: DC | PRN
  Administered 2015-11-13: 4 mg via INTRAVENOUS

## 2015-11-13 MED ORDER — FENTANYL CITRATE (PF) 100 MCG/2ML IJ SOLN
INTRAMUSCULAR | Status: AC
  Filled 2015-11-13: qty 2

## 2015-11-13 MED ORDER — OXYCODONE-ACETAMINOPHEN 10-325 MG PO TABS: 1.0000 | ORAL_TABLET | Freq: Four times a day (QID) | ORAL | Status: AC | PRN

## 2015-11-13 MED ORDER — 0.9 % SODIUM CHLORIDE (POUR BTL) OPTIME
TOPICAL | Status: DC | PRN
  Administered 2015-11-13: 1000 mL

## 2015-11-13 MED ORDER — GLYCOPYRROLATE 0.2 MG/ML IJ SOLN
INTRAMUSCULAR | Status: AC
  Filled 2015-11-13: qty 2

## 2015-11-13 MED ORDER — FENTANYL CITRATE (PF) 250 MCG/5ML IJ SOLN
INTRAMUSCULAR | Status: AC
  Filled 2015-11-13: qty 5

## 2015-11-13 SURGICAL SUPPLY — 40 items
APPLIER CLIP 5 13 M/L LIGAMAX5 (MISCELLANEOUS) ×3
APR CLP MED LRG 5 ANG JAW (MISCELLANEOUS) ×1
BAG SPEC RTRVL 10 TROC 200 (ENDOMECHANICALS) ×1
BLADE SURG ROTATE 9660 (MISCELLANEOUS) IMPLANT
CANISTER SUCTION 2500CC (MISCELLANEOUS) ×3 IMPLANT
CHLORAPREP W/TINT 26ML (MISCELLANEOUS) ×3 IMPLANT
CLIP APPLIE 5 13 M/L LIGAMAX5 (MISCELLANEOUS) ×1 IMPLANT
CLOSURE WOUND 1/2 X4 (GAUZE/BANDAGES/DRESSINGS) ×1
COVER SURGICAL LIGHT HANDLE (MISCELLANEOUS) ×3 IMPLANT
DEVICE TROCAR PUNCTURE CLOSURE (ENDOMECHANICALS) ×3 IMPLANT
ELECT REM PT RETURN 9FT ADLT (ELECTROSURGICAL) ×3
ELECTRODE REM PT RTRN 9FT ADLT (ELECTROSURGICAL) ×1 IMPLANT
GLOVE BIO SURGEON STRL SZ7 (GLOVE) ×9 IMPLANT
GLOVE BIOGEL PI IND STRL 7.0 (GLOVE) IMPLANT
GLOVE BIOGEL PI IND STRL 7.5 (GLOVE) ×1 IMPLANT
GLOVE BIOGEL PI INDICATOR 7.0 (GLOVE) ×4
GLOVE BIOGEL PI INDICATOR 7.5 (GLOVE) ×4
GLOVE ECLIPSE 7.5 STRL STRAW (GLOVE) ×2 IMPLANT
GOWN STRL REUS W/ TWL LRG LVL3 (GOWN DISPOSABLE) ×3 IMPLANT
GOWN STRL REUS W/TWL LRG LVL3 (GOWN DISPOSABLE) ×9
KIT BASIN OR (CUSTOM PROCEDURE TRAY) ×3 IMPLANT
KIT ROOM TURNOVER OR (KITS) ×3 IMPLANT
LIQUID BAND (GAUZE/BANDAGES/DRESSINGS) ×3 IMPLANT
NS IRRIG 1000ML POUR BTL (IV SOLUTION) ×3 IMPLANT
PAD ARMBOARD 7.5X6 YLW CONV (MISCELLANEOUS) ×3 IMPLANT
POUCH RETRIEVAL ECOSAC 10 (ENDOMECHANICALS) ×1 IMPLANT
POUCH RETRIEVAL ECOSAC 10MM (ENDOMECHANICALS) ×2
SCISSORS LAP 5X35 DISP (ENDOMECHANICALS) ×3 IMPLANT
SET IRRIG TUBING LAPAROSCOPIC (IRRIGATION / IRRIGATOR) ×3 IMPLANT
SLEEVE ENDOPATH XCEL 5M (ENDOMECHANICALS) ×6 IMPLANT
SPECIMEN JAR SMALL (MISCELLANEOUS) ×3 IMPLANT
STRIP CLOSURE SKIN 1/2X4 (GAUZE/BANDAGES/DRESSINGS) ×2 IMPLANT
SUT MNCRL AB 4-0 PS2 18 (SUTURE) ×3 IMPLANT
SUT VICRYL 0 UR6 27IN ABS (SUTURE) ×3 IMPLANT
TOWEL OR 17X24 6PK STRL BLUE (TOWEL DISPOSABLE) ×3 IMPLANT
TOWEL OR 17X26 10 PK STRL BLUE (TOWEL DISPOSABLE) ×3 IMPLANT
TRAY LAPAROSCOPIC MC (CUSTOM PROCEDURE TRAY) ×3 IMPLANT
TROCAR XCEL BLUNT TIP 100MML (ENDOMECHANICALS) ×3 IMPLANT
TROCAR XCEL NON-BLD 5MMX100MML (ENDOMECHANICALS) ×3 IMPLANT
TUBING INSUFFLATION (TUBING) ×3 IMPLANT

## 2015-11-13 NOTE — H&P (Signed)
50 yof who underwent back surgery with Dr Shon Baton since November. ever since then she has nausea after eating. this is primarily with fatty foods. some discomfort. she has some constipation. she is still taking tramadol for back pain. she has no prior history. US shows partially distended gb with multiple gallstones. there also a 4.5 cm hyperechoic lesion in the posterior lobe of the liver. she is here today to discuss options   Other Problems Fay Records, CMA; 10/16/2015 1:48 PM) Arthritis Back Pain Diabetes Mellitus Gastroesophageal Reflux Disease Hemorrhoids High blood pressure Hypercholesterolemia  Past Surgical History Fay Records, CMA; 10/16/2015 1:48 PM) Cesarean Section - 1 Spinal Surgery - Lower Back  Diagnostic Studies History Fay Records, CMA; 10/16/2015 1:48 PM) Colonoscopy never Mammogram 1-3 years ago  Allergies Fay Records, CMA; 10/16/2015 1:49 PM) No Known Drug Allergies03/20/2017  Medication History Fay Records, CMA; 10/16/2015 1:51 PM) Prempro (0.625-2.5MG  Tablet, Oral) Active. Lisinopril (  Tablet, Oral) Active. Crestor (  Tablet, Oral) Active. TraMADol HCl (  Tablet, Oral) Active. HydroCHLOROthiazide (12.5MG  Tablet, Oral) Active. Promethazine HCl (  Tablet, Oral) Active. Amitiza ( Capsule, Oral) Active. Xigduo XR (10-1000MG  Tablet ER 24HR, Oral) Active. Estradiol (  Tablet, Oral) Active. Medications Reconciled  Social History Fay Records, [place]; 10/16/2015 1:48 PM) Caffeine use Carbonated beverages, Tea. No alcohol use No drug use Tobacco use Never smoker.  Family History Fay Records, [place]; 10/16/2015 1:48 PM) Arthritis Brother, Father, Mother, Sister. Breast Cancer Mother. Cerebrovascular Accident Mother. Diabetes Mellitus Brother, Daughter, Father, Mother. Heart disease in female family member before age 62 Hypertension Father, Mother, Sister.  Pregnancy / Birth History Fay Records, CMA;  10/16/2015 1:48 PM) Age at menarche 13 years. Contraceptive History Oral contraceptives. Gravida 3 Irregular periods Maternal age 54-20 Para 2    Review of Systems Fay Records CMA; 10/16/2015 1:48 PM) General Present- Appetite Loss, Fatigue and Weight Loss. Not Present- Chills, Fever, Night Sweats and Weight Gain. Skin Not Present- Change in Wart/Mole, Dryness, Hives, Jaundice, New Lesions, Non-Healing Wounds, Rash and Ulcer. HEENT Present- Ringing in the Ears, Seasonal Allergies, Sinus Pain and Wears glasses/contact lenses. Not Present- Earache, Hearing Loss, Hoarseness, Nose Bleed, Oral Ulcers, Sore Throat, Visual Disturbances and Yellow Eyes. Respiratory Not Present- Bloody sputum, Chronic Cough, Difficulty Breathing, Snoring and Wheezing. Breast Not Present- Breast Mass, Breast Pain, Nipple Discharge and Skin Changes. Cardiovascular Not Present- Chest Pain, Difficulty Breathing Lying Down, Leg Cramps, Palpitations, Rapid Heart Rate, Shortness of Breath and Swelling of Extremities. Gastrointestinal Present- Change in Bowel Habits, Constipation and Nausea. Not Present- Abdominal Pain, Bloating, Bloody Stool, Chronic diarrhea, Difficulty Swallowing, Excessive gas, Gets full quickly at meals, Hemorrhoids, Indigestion, Rectal Pain and Vomiting. Female Genitourinary Not Present- Frequency, Nocturia, Painful Urination, Pelvic Pain and Urgency. Musculoskeletal Present- Back Pain, Joint Pain and Muscle Weakness. Not Present- Joint Stiffness, Muscle Pain and Swelling of Extremities. Neurological Present- Numbness, Tingling, Trouble walking and Weakness. Not Present- Decreased Memory, Fainting, Headaches, Seizures and Tremor. Psychiatric Present- Change in Sleep Pattern. Not Present- Anxiety, Bipolar, Depression, Fearful and Frequent crying. Endocrine Present- Hot flashes. Not Present- Cold Intolerance, Excessive Hunger, Hair Changes, Heat Intolerance and New Diabetes. Hematology Present- Easy  Bruising. Not Present- Excessive bleeding, Gland problems, HIV and Persistent Infections.  Vitals Fay Records CMA; 10/16/2015 1:52 PM) 10/16/2015 1:51 PM Weight: 204.2 lb Height: 65in Body Surface Area: 2 m Body Mass Index: 33.98 kg/m  Temp.: 97.56F  Pulse: 68 (Regular)  BP: 130/84 (Sitting, Left Arm, Standard)       Physical Exam Emelia Loron MD; 10/16/2015 3:19  PM) General Mental Status-Alert. Orientation-Oriented X3.  Eye Sclera/Conjunctiva - Bilateral-No scleral icterus.  Chest and Lung Exam Chest and lung exam reveals -on auscultation, normal breath sounds, no adventitious sounds and normal vocal resonance.  Cardiovascular Cardiovascular examination reveals -normal heart sounds, regular rate and rhythm with no murmurs.  Abdomen Note: soft nondistended tender to palpation in ruq, no murphys sign     Assessment & Plan Emelia Loron(Nitin Mckowen MD; 10/16/2015 3:24 PM) GALLSTONES (K80.20) Story: laparoscopic cholecystectomy I think some of her symptoms may certainly be from her gallbladder. there is certainly a chance that surgery may not cure any or all of her symptoms as they are nonspecific. she is however tender at her gb on exam today. Will plan to proceed with surgery. I discussed the procedure in detail. The patient was given Agricultural engineereducational material. We discussed the risks and benefits of a laparoscopic cholecystectomy and possible cholangiogram including, but not limited to bleeding, infection, injury to surrounding structures such as the intestine or liver, bile leak, retained gallstones, need to convert to an open procedure, prolonged diarrhea, blood clots such as DVT, common bile duct injury, anesthesia risks, and possible need for additional procedures. The likelihood of improvement in symptoms and return to the patient's normal status is good. We discussed the typical post-operative recovery course.

## 2015-11-13 NOTE — Discharge Instructions (Signed)
[facility] LAP[facility]OS[facility]: POST OP INSTRUCTIONS  Always review your discharge instruction sheet given to you by the facility where your surgery was performed. IF YOU HAVE DISABILITY OR FAMILY LEAVE FORMS, YOU MUST BRING THEM TO THE OFFICE FOR PROCESSING.   DO NOT GIVE THEM TO YOUR DOCTOR.  1. A prescription for pain medication may be given to you upon discharge.  Take your pain medication as prescribed, if needed.  If narcotic pain medicine is not needed, then you may take acetaminophen (Tylenol), naprosyn (Alleve), or ibuprofen (Advil) as needed. 2. Take your usually prescribed medications unless otherwise directed. 3. If you need a refill on your pain medication, please contact your pharmacy.  They will contact our office to request authorization. Prescriptions will not be filled after 5pm or on week-ends. 4. You should follow a light diet the first few days after arrival home, such as soup and crackers, etc.  Be sure to include lots of fluids daily. 5. Most patients will experience some swelling and bruising in the area of the incisions.  Ice packs will help.  Swelling and bruising can take several days to resolve.  6. It is common to experience some constipation if taking pain medication after surgery.  Increasing fluid intake and taking a stool softener (such as Colace) will usually help or prevent this problem from occurring.  A mild laxative (Milk of Magnesia or Miralax) should be taken according to package instructions if there are no bowel movements after 48 hours. 7. Unless discharge instructions indicate otherwise, you may remove your bandages 48 hours after surgery, and you may shower at that time.  You may have steri-strips (small skin tapes) in place directly over the incision.  These strips should be left on the skin for 7-10 days.  If your surgeon used skin glue on the incision, you may shower in 24 hours.  The glue will flake  off over the next 2-3 weeks.  Any sutures or staples will be removed at the office during your follow-up visit. 8. ACTIVITIES:  You may resume regular (light) daily activities beginning the next day--such as daily self-care, walking, climbing stairs--gradually increasing activities as tolerated.  You may have sexual intercourse when it is comfortable.  Refrain from any heavy lifting or straining until approved by your doctor. a. You may drive when you are no longer taking prescription pain medication, you can comfortably wear a seatbelt, and you can safely maneuver your car and apply brakes. b. RETURN TO WORK:  __________________________________________________________ 9. You should see your doctor in the office for a follow-up appointment approximately 2-3 weeks after your surgery.  Make sure that you call for this appointment within a day or two after you arrive home to insure a convenient appointment time. 10. OTHER INSTRUCTIONS: __________________________________________________________________________________________________________________________ __________________________________________________________________________________________________________________________ WHEN TO CALL YOUR DOCTOR: 1. Fever over 101.0 2. Inability to urinate 3. Continued bleeding from incision. 4. Increased pain, redness, or drainage from the incision. 5. Increasing abdominal pain  The clinic staff is available to answer your questions during regular business hours.  Please don't hesitate to call and ask to speak to one of the nurses for clinical concerns.  If you have a medical emergency, go to the nearest emergency room or call 911.  A surgeon from [facility] is always on call at the hospital. [address], [address], [place], [place]  [address] ? [address] [address], [place], [place]   [address] [phone] ? [phone] ? [phone] Web site: [email or web address]  

## 2015-11-13 NOTE — Transfer of Care (Signed)
Immediate Anesthesia Transfer of Care Note  Patient: Tammy BucklerElizabeth A Mayo  Procedure(s) Performed: Procedure(s): LAPAROSCOPIC CHOLECYSTECTOMY (N/A)  Patient Location: PACU  Anesthesia Type:General  Level of Consciousness: awake, oriented and patient cooperative  Airway & Oxygen Therapy: Patient Spontanous Breathing and Patient connected to nasal cannula oxygen  Post-op Assessment: Report given to RN, Post -op Vital signs reviewed and stable and Patient moving all extremities  Post vital signs: Reviewed and stable  Last Vitals:  Filed Vitals:   11/13/15 0809  BP: 107/61  Pulse: 69  Temp: 36.8 C  Resp: 18    Complications: No apparent anesthesia complications

## 2015-11-13 NOTE — Op Note (Signed)
Preoperative diagnosis: symptomatic cholelithiasis Postoperative diagnosis: saa Procedure: laparoscopic cholecystectomy Surgeon: Dr Harden MoMatt Nicklous Aburto Anesthesia: general EBL: minimal Drains none Specimen gb and contents to pathology Complications: none Sponge count correct at completion Disposition to recovery stable  Indications: This is a [age] yof with symptoms consistent with biliary colic. She has gallstones on an ultrasound with normal lfts.  We discussed laparoscopic cholecystectomy.   Procedure: After informed consent was obtained the patient was taken to the operating room. She was given antibiotics. Sequential compression devices were on her legs. She was placed under general anesthesia without complication. Her abdomen was prepped and draped in the standard sterile surgical fashion. A surgical timeout was then performed.  I infiltrated marcaine below the umbilicus. I made an incision and then entered the fascia sharply. I then entered the peritoneum bluntly. There was no evidence of an entry injury. I placed a 0 vicryl pursestring suture and inserted a hasson trocar. I then inserted 3 further 5 mm trocars in the epigastrium and ruq. I was able to grasp the gallbladder and retract it cephalad and lateral. I then obtained the critical view of safety. I was able to identify the duct and place three clips proximally and one distally. The duct was divided. The duct was viable and the clips traversed the duct. I then treated the cystic artery the same. There was a small posterior branch that I clipped and divided as well. I then removed the gallbladder from the liver bed and placed it in a bag. I removed the gallbladder from the umbilical incision. I then obtained hemostasis and irrigated.I then removed the umbilical trocar and closed with 0 vicryl and the endoclose device after tying down the pursestring.  I then desufflated the abdomen and removed all my remaining trocars. I then closed  these with 4-0 Monocryl and Dermabond. She tolerated this well was extubated and transferred to the recovery room in stable condition

## 2015-11-13 NOTE — Anesthesia Procedure Notes (Signed)
Procedure Name: Intubation Date/Time: 11/13/2015 9:28 AM Performed by: Charm BargesBUTLER, Vernon Maish R Pre-anesthesia Checklist: Patient identified, Emergency Drugs available, Suction available and Patient being monitored Patient Re-evaluated:Patient Re-evaluated prior to inductionOxygen Delivery Method: Circle System Utilized Preoxygenation: Pre-oxygenation with 100% oxygen Intubation Type: IV induction Ventilation: Mask ventilation without difficulty Laryngoscope Size: Mac and 3 Grade View: Grade I Tube type: Oral Tube size: 7.5 mm Number of attempts: 1 Airway Equipment and Method: Stylet Placement Confirmation: ETT inserted through vocal cords under direct vision,  positive ETCO2 and breath sounds checked- equal and bilateral Secured at: 21 cm Tube secured with: Tape Dental Injury: Teeth and Oropharynx as per pre-operative assessment

## 2015-11-13 NOTE — Anesthesia Preprocedure Evaluation (Addendum)
Anesthesia Evaluation  Patient identified by MRN, date of birth, ID band Patient awake    Reviewed: Allergy & Precautions, NPO status , Patient's Chart, lab work & pertinent test results  History of Anesthesia Complications Negative for: history of anesthetic complications  Airway Mallampati: II  TM Distance: >3 FB Neck ROM: Full    Dental  (+) Teeth Intact, Dental Advisory Given   Pulmonary neg pulmonary ROS,    breath sounds clear to auscultation       Cardiovascular hypertension, Pt. on medications  Rhythm:Regular     Neuro/Psych negative neurological ROS  negative psych ROS   GI/Hepatic Neg liver ROS, GERD  ,  Endo/Other  diabetes  Renal/GU      Musculoskeletal  (+) Arthritis ,   Abdominal   Peds  Hematology negative hematology ROS (+)   Anesthesia Other Findings   Reproductive/Obstetrics                           Anesthesia Physical Anesthesia Plan  ASA: II  Anesthesia Plan: General   Post-op Pain Management:    Induction: Intravenous  Airway Management Planned: Oral ETT  Additional Equipment: None  Intra-op Plan:   Post-operative Plan: Extubation in OR  Informed Consent: I have reviewed the patients History and Physical, chart, labs and discussed the procedure including the risks, benefits and alternatives for the proposed anesthesia with the patient or authorized representative who has indicated his/her understanding and acceptance.   Dental advisory given  Plan Discussed with: CRNA, Anesthesiologist and Surgeon  Anesthesia Plan Comments:         Anesthesia Quick Evaluation

## 2015-11-14 ENCOUNTER — Encounter (HOSPITAL_COMMUNITY): Payer: Self-pay | Admitting: General Surgery

## 2015-11-15 NOTE — Anesthesia Postprocedure Evaluation (Signed)
Anesthesia Post Note  Patient: Hassan Bucklerlizabeth A Dressel  Procedure(s) Performed: Procedure(s) (LRB): LAPAROSCOPIC CHOLECYSTECTOMY (N/A)  Patient location during evaluation: PACU Anesthesia Type: General Level of consciousness: awake Pain management: pain level controlled Vital Signs Assessment: post-procedure vital signs reviewed and stable Respiratory status: spontaneous breathing Cardiovascular status: stable Postop Assessment: no signs of nausea or vomiting Anesthetic complications: no    Last Vitals:  Filed Vitals:   11/13/15 1110 11/13/15 1118  BP: 118/82 135/81  Pulse: 64 63  Temp:    Resp: 14     Last Pain:  Filed Vitals:   11/13/15 1149  PainSc: 3                  Kamile Fassler

## 2016-01-22 ENCOUNTER — Other Ambulatory Visit: Payer: Self-pay | Admitting: Physician Assistant

## 2016-01-22 ENCOUNTER — Other Ambulatory Visit: Payer: Self-pay | Admitting: Orthopedic Surgery

## 2016-01-22 DIAGNOSIS — Z4789 Encounter for other orthopedic aftercare: Secondary | ICD-10-CM

## 2016-02-02 ENCOUNTER — Ambulatory Visit
Admission: RE | Admit: 2016-02-02 | Discharge: 2016-02-02 | Disposition: A | Discharge status: Home / Self Care | Payer: BLUE CROSS/BLUE SHIELD | Source: Ambulatory Visit | Attending: Orthopedic Surgery | Admitting: Orthopedic Surgery

## 2016-02-02 DIAGNOSIS — Z4789 Encounter for other orthopedic aftercare: Secondary | ICD-10-CM

## 2016-02-02 MED ORDER — IOPAMIDOL (ISOVUE-300) INJECTION 61%
100.0000 mL | Freq: Once | INTRAVENOUS | Status: AC | PRN
  Administered 2016-02-02: 100 mL via INTRAVENOUS

## 2016-02-06 ENCOUNTER — Encounter: Payer: Self-pay | Admitting: Gastroenterology

## 2016-04-09 ENCOUNTER — Ambulatory Visit: Payer: Self-pay | Admitting: Gastroenterology

## 2016-08-05 ENCOUNTER — Ambulatory Visit: Payer: BLUE CROSS/BLUE SHIELD | Attending: *Deleted | Admitting: Physical Therapy

## 2016-08-05 DIAGNOSIS — M6281 Muscle weakness (generalized): Secondary | ICD-10-CM | POA: Insufficient documentation

## 2016-08-05 DIAGNOSIS — G8929 Other chronic pain: Secondary | ICD-10-CM | POA: Diagnosis present

## 2016-08-05 DIAGNOSIS — M545 Low back pain: Secondary | ICD-10-CM | POA: Insufficient documentation

## 2016-08-05 NOTE — Therapy (Signed)
[facility] [address] [place], [place], [address] Phone: [phone]   Fax:  [phone]  Physical Therapy Evaluation  Patient Details  Name: Tammy Mayo MRN: [id number] Date of Birth: 1966/02/28 Referring Provider: Venita Lick MD.  Encounter Date: 08/05/2016      PT End of Session - 08/05/16 1434    Visit Number 1   Number of Visits 16   Date for PT Re-Evaluation 09/30/16   PT Start Time 1115   PT Stop Time 1204   PT Time Calculation (min) 49 min   Activity Tolerance Patient tolerated treatment well   Behavior During Therapy [facility] for tasks assessed/performed      Past Medical History:  Diagnosis Date  . Anemia   . Arthritis   . Chronic back pain    lumbar 4-5 slip and radicular right leg pain  . Diabetes mellitus without complication (HCC)   . GERD (gastroesophageal reflux disease)   . Hyperlipidemia   . Hypertension   . Seasonal allergies   . Vitamin D deficiency   . Wears glasses     Past Surgical History:  Procedure Laterality Date  . BACK SURGERY    . CESAREAN SECTION     x 1  . CHOLECYSTECTOMY N/A 11/13/2015   Procedure: LAPAROSCOPIC CHOLECYSTECTOMY;  Surgeon: Emelia Loron, MD;  Location: [facility] OR;  Service: General;  Laterality: N/A;  . DILATION AND CURETTAGE OF UTERUS    . HERNIA REPAIR     umbilical hernia repair as a child  . TUBAL LIGATION      There were no vitals filed for this visit.       Subjective Assessment - 08/05/16 1326    Subjective The patient presents to outpatient physical therapy.  She had an L4-L5 fusion surgery performed on 06/21/15 following a work related accident.  She reports she is definitely better since surgery but still experiences pain into her right buttock and tingling into right foot .  She rates her pain at an 8/10 today..  heat decreases her pain and walking, standing, sitting, reaching and twisting increases her pain.   Limitations Sitting;Standing   How  long can you sit comfortably? 20-30 minutes   How long can you stand comfortably? 10 minutes   How long can you walk comfortably? Short distances.   Patient Stated Goals Decrease pain.   Pain Score 8    Pain Location Back   Pain Orientation Right   Pain Descriptors / Indicators Aching;Tingling   Pain Type Chronic pain   Pain Onset More than a month ago   Pain Frequency Constant   Aggravating Factors  See above.   Pain Relieving Factors See above.            [facility] PT Assessment - 08/05/16 0001      Assessment   Medical Diagnosis DDD Lumbar with right sciatica.   Onset Date/Surgical Date --  06/20/16(surgery).     Restrictions   Weight Bearing Restrictions No     Balance Screen   Has the patient fallen in the past 6 months No   Has the patient had a decrease in activity level because of a fear of falling?  Yes   Is the patient reluctant to leave their home because of a fear of falling?  Yes  "Sometimes."     Home Environment   Living Environment Private residence     Prior Function   Level of Independence Independent     Posture/Postural Control  Posture/Postural Control No significant limitations     ROM / Strength   AROM / PROM / Strength AROM;Strength     AROM   Right Hip Flexion --  In supine= 65 degrees.   Left Hip Flexion --  In supine= 95 degrees.     Strength   Overall Strength Comments Right knee extension and right ankle dorsiflexion= 4-/5.     Palpation   Palpation comment Right lower lumbar and upper gluteal region.     Special Tests    Special Tests --  Norm bil LE DTR's.  Leg lengths are equal.     Transfers   Transfers --  Slow and purposeful.     Ambulation/Gait   Gait Comments The patient walks slow and purposeful in obvious pain.                   [facility] Adult PT Treatment/Exercise - 08/05/16 0001      Modalities   Modalities Electrical Stimulation     Moist Heat Therapy   Number Minutes Moist Heat 20 Minutes    Moist Heat Location Lumbar Spine     Electrical Stimulation   Electrical Stimulation Location RT LOW BACK.   Electrical Stimulation Action Constant Pre-mod.   Electrical Stimulation Parameters 80-150 Hz x 20 minutes.   Electrical Stimulation Goals Pain                     PT Long Term Goals - 08/05/16 1427      PT LONG TERM GOAL #1   Title Ind with a HEP.   Time 8   Period Weeks   Status New     PT LONG TERM GOAL #2   Title Sit 30 minutes with pain not > 3/10.   Time 8   Period Weeks   Status New     PT LONG TERM GOAL #3   Title Stand 30 minutes with pain not > 3/10.   Time 8   Period Weeks   Status New     PT LONG TERM GOAL #4   Title Sleep 6 hours undisturbed.   Time 8   Period Weeks   Status New     PT LONG TERM GOAL #5   Title Perform ADL's with pain not > 3-4/10.   Time 8   Period Weeks     PT LONG TERM GOAL #6   Title Bilateral hip flexion= 115 degrees.   Time 8   Period Weeks   Status New               Plan - 08/05/16 1429    PT Next Visit Plan Progress with core strengthening exercises and modalities PRN for pain. Dry needling.      Patient will benefit from skilled therapeutic intervention in order to improve the following deficits and impairments:  Pain, Decreased activity tolerance, Decreased range of motion, Decreased strength  Visit Diagnosis: Chronic right-sided low back pain, with sciatica presence unspecified - Plan: PT PLAN OF CARE CERT/RE-CERT  Muscle weakness (generalized) - Plan: PT PLAN OF CARE CERT/RE-CERT     Problem List Patient Active Problem List   Diagnosis Date Noted  . Back pain 06/21/2015  . HTN (hypertension) 10/23/2010  . Vitamin D deficiency 10/23/2010  . Hyperlipemia 10/23/2010  . Plantar fasciitis 10/23/2010    APP[place], [place] MPT 08/05/2016, 2:41 PM  [facility] [address] [place], [place], [address] Phone: [phone]   Fax:  [phone]  Name: Tammy Mayo MRN: [id number] Date of Birth: [birth year]

## 2016-08-07 ENCOUNTER — Encounter: Payer: Self-pay | Admitting: Physical Therapy

## 2016-08-07 ENCOUNTER — Ambulatory Visit: Payer: BLUE CROSS/BLUE SHIELD | Admitting: Physical Therapy

## 2016-08-07 DIAGNOSIS — G8929 Other chronic pain: Secondary | ICD-10-CM

## 2016-08-07 DIAGNOSIS — M6281 Muscle weakness (generalized): Secondary | ICD-10-CM

## 2016-08-07 DIAGNOSIS — M545 Low back pain: Secondary | ICD-10-CM | POA: Diagnosis not present

## 2016-08-07 NOTE — Patient Instructions (Addendum)
Pelvic Tilt    Flatten back by tightening stomach muscles and buttocks. Repeat __10__ times per set. Do _2___ sets per session. Do __2-3__ sessions per day.  http://orth.exer.us/134   Copyright  VHI. All rights reserved.  Straight Leg Raise    Tighten stomach and slowly raise locked right leg ____ inches from floor. Repeat __10__ times per set. Do _1-2___ sets per session. Do _2-3___ sessions per day.  http://orth.exer.us/1102   Copyright  VHI. All rights reserved.  Piriformis (Supine)    Cross legs, right on top. Gently pull other knee toward chest until stretch is felt in buttock/hip of top leg. Hold __30__ seconds. Repeat __3__ times per set. Do ____ sets per session. Do _2-3___ sessions per day.  http://orth.exer.us/676   Copyright  VHI. All rights reserved.  Knee-to-Chest Stretch: Unilateral    With hand behind right knee, pull knee in to chest until a comfortable stretch is felt in lower back and buttocks. Keep back relaxed. Hold __30__ seconds. Repeat __3__ times per set. Do ____ sets per session. Do _2-3___ sessions per day.  http://orth.exer.us/126   Copyright  VHI. All rights reserved.

## 2016-08-07 NOTE — Therapy (Signed)
[facility] [address] [place], [place], [address] Phone: [phone]   Fax:  [phone]  Physical Therapy Treatment  Patient Details  Name: Tammy Mayo MRN: [id number] Date of Birth: 1965-10-01 Referring Provider: Venita Lickahari Brooks MD.  Encounter Date: 08/07/2016      PT End of Session - 08/07/16 1310    Visit Number 2   Number of Visits 16   Date for PT Re-Evaluation 09/30/16   PT Start Time 1306   PT Stop Time 1359   PT Time Calculation (min) 53 min   Activity Tolerance Patient tolerated treatment well   Behavior During Therapy [facility] for tasks assessed/performed      Past Medical History:  Diagnosis Date  . Anemia   . Arthritis   . Chronic back pain    lumbar 4-5 slip and radicular right leg pain  . Diabetes mellitus without complication (HCC)   . GERD (gastroesophageal reflux disease)   . Hyperlipidemia   . Hypertension   . Seasonal allergies   . Vitamin D deficiency   . Wears glasses     Past Surgical History:  Procedure Laterality Date  . BACK SURGERY    . CESAREAN SECTION     x 1  . CHOLECYSTECTOMY N/A 11/13/2015   Procedure: LAPAROSCOPIC CHOLECYSTECTOMY;  Surgeon: Emelia LoronMatthew Wakefield, MD;  [facility];  Service: General;  Laterality: N/A;  . DILATION AND CURETTAGE OF UTERUS    . HERNIA REPAIR     umbilical hernia repair as a child  . TUBAL LIGATION      There were no vitals filed for this visit.      Subjective Assessment - 08/07/16 1306    Subjective Reports that she has some pain in posterior R hip and into her R groin area.    Limitations Sitting;Standing   How long can you sit comfortably? 20-30 minutes   How long can you stand comfortably? 10 minutes   How long can you walk comfortably? Short distances.   Patient Stated Goals Decrease pain.   Currently in Pain? Yes   Pain Score 8    Pain Location Back   Pain Orientation Right   Pain Descriptors / Indicators Other (Comment)  "pain"   Pain Type Chronic pain   Pain Onset More than a month ago            [facility] - 08/07/16 0001      Assessment   Medical Diagnosis DDD Lumbar with right sciatica.   Onset Date/Surgical Date 06/20/16   Next MD Visit None scheduled     Precautions   Precautions Back                     [facility] Adult PT Treatment/Exercise - 08/07/16 0001      Lumbar Exercises: Stretches   Single Knee to Chest Stretch 3 reps;30 seconds   Single Knee to Chest Stretch Limitations BLE   Piriformis Stretch 3 reps;30 seconds   Piriformis Stretch Limitations BLE     Lumbar Exercises: Aerobic   Stationary Bike Nustep level 5 x 10 minutes.  with VCs for core activation     Lumbar Exercises: Standing   Other Standing Lumbar Exercises B hip abduction AROM with core activation x20 reps     Lumbar Exercises: Supine   Bent Knee Raise 20 reps   Straight Leg Raise 10 reps   Straight Leg Raises Limitations BLE     Modalities   Modalities  Electrical Stimulation;Moist Heat     Moist Heat Therapy   Number Minutes Moist Heat 15 Minutes   Moist Heat Location Lumbar Spine;Hip     Electrical Stimulation   Electrical Stimulation Location R low back/ buttock   Electrical Stimulation Action IFC   Electrical Stimulation Parameters 1-10 hz x15 min   Electrical Stimulation Goals Pain                PT Education - 08/07/16 1350    Education provided Yes   Education Details HEP- SKTC, Piriformis stretch, ab set, SLR   Person(s) Educated Patient   Methods Explanation;Demonstration;Verbal cues;Handout   Comprehension Verbalized understanding;Returned demonstration;Verbal cues required;Need further instruction             PT Long Term Goals - 08/05/16 1427      PT LONG TERM GOAL #1   Title Ind with a HEP.   Time 8   Period Weeks   Status New     PT LONG TERM GOAL #2   Title Sit 30 minutes with pain not > 3/10.   Time 8   Period Weeks   Status New     PT LONG  TERM GOAL #3   Title Stand 30 minutes with pain not > 3/10.   Time 8   Period Weeks   Status New     PT LONG TERM GOAL #4   Title Sleep 6 hours undisturbed.   Time 8   Period Weeks   Status New     PT LONG TERM GOAL #5   Title Perform ADL's with pain not > 3-4/10.   Time 8   Period Weeks     PT LONG TERM GOAL #6   Title Bilateral hip flexion= 115 degrees.   Time 8   Period Weeks   Status New               Plan - 08/07/16 1431    Clinical Impression Statement Patient arrived to clinic with reports of increased (8/10) R low back and hip pain that is increased with prolonged sitting or standing. Patient was instructed with all exercises execute proper core activation and patient was educated regarding the technique. Patient experienced increased discomfort with hip flexion such as SKTC stretches. Patient experienced discomfort with exercises especially with RLE with SLR and standing on RLE in standing hip abduction. Patient experienced good stretch with figure 4 Piriformis stretch to R hip today per patient report. Patient was provided low back/ hip stretches as well as initial core and LE strengthening exercises. Patient educated regarding parameters for HEP with patient verablizing instructions. Patient also encouraged to complete exercises but to stop with any increased pain. Patient was observed as transferring from supine to sit not using log rolling technique. Patient was guided through log rolling technique using demonstration and tactile/VCs. Normal modalities response noted following removal of the modalities.   Rehab Potential Good   PT Frequency 2x / week   PT Duration 8 weeks   PT Treatment/Interventions ADLs/Self Care Home Management;Electrical Stimulation;Moist Heat;Therapeutic exercise;Therapeutic activities;Ultrasound;Patient/family education;Manual techniques;Dry needling   PT Next Visit Plan Progress with core strengthening exercises and modalities PRN for pain.  Dry needling.   PT Home Exercise Plan SKTC, Piriformis figure 4 stretch, ab set, SLR   Consulted and Agree with Plan of Care Patient      Patient will benefit from skilled therapeutic intervention in order to improve the following deficits and impairments:  Pain, Decreased activity tolerance, Decreased  range of motion, Decreased strength  Visit Diagnosis: Chronic right-sided low back pain, with sciatica presence unspecified  Muscle weakness (generalized)     Problem List Patient Active Problem List   Diagnosis Date Noted  . Back pain 06/21/2015  . HTN (hypertension) 10/23/2010  . Vitamin D deficiency 10/23/2010  . Hyperlipemia 10/23/2010  . Plantar fasciitis 10/23/2010    Evelene Croon, PTA 08/07/2016, 2:43 PM  [facility] [address] [place], [place], [address] Phone: [phone]   Fax:  [phone]  Name: Tammy Mayo MRN: [id number] Date of Birth: January 28, 1966

## 2016-08-12 ENCOUNTER — Ambulatory Visit: Payer: BLUE CROSS/BLUE SHIELD | Admitting: Physical Therapy

## 2016-08-12 ENCOUNTER — Encounter: Payer: Self-pay | Admitting: Physical Therapy

## 2016-08-12 DIAGNOSIS — M6281 Muscle weakness (generalized): Secondary | ICD-10-CM

## 2016-08-12 DIAGNOSIS — M545 Low back pain: Principal | ICD-10-CM

## 2016-08-12 DIAGNOSIS — G8929 Other chronic pain: Secondary | ICD-10-CM

## 2016-08-12 NOTE — Therapy (Addendum)
[facility] [place], [place], [address] Phone: [phone]   Fax:  [phone]  Physical Therapy Treatment  Patient Details  Name: Tammy Mayo MRN: [id number] Date of Birth: August 24, 1965 Referring Provider: Melina Schools MD.  Encounter Date: 08/12/2016      PT End of Session - 08/12/16 1305    Visit Number 3   Number of Visits 16   Date for PT Re-Evaluation 09/30/16   PT Start Time 1304   PT Stop Time 1354   PT Time Calculation (min) 50 min   Activity Tolerance Patient tolerated treatment well   Behavior During Therapy [facility] for tasks assessed/performed      Past Medical History:  Diagnosis Date  . Anemia   . Arthritis   . Chronic back pain    lumbar 4-5 slip and radicular right leg pain  . Diabetes mellitus without complication ([facility])   . GERD (gastroesophageal reflux disease)   . Hyperlipidemia   . Hypertension   . Seasonal allergies   . Vitamin D deficiency   . Wears glasses     Past Surgical History:  Procedure Laterality Date  . BACK SURGERY    . CESAREAN SECTION     x 1  . CHOLECYSTECTOMY N/A 11/13/2015   Procedure: LAPAROSCOPIC CHOLECYSTECTOMY;  Surgeon: Rolm Bookbinder, MD;  Location: [facility];  Service: General;  Laterality: N/A;  . DILATION AND CURETTAGE OF UTERUS    . HERNIA REPAIR     umbilical hernia repair as a child  . TUBAL LIGATION      There were no vitals filed for this visit.      Subjective Assessment - 08/12/16 1305    Subjective Reports completing HEP since previous treatment except for sunday. Reports that her R knee has been bothering her and she has developed a bruise along the medial R knee. Reports that she has a appt with Dr. Melina Copa 08/15/2016 after calling today.   Limitations Sitting;Standing   How long can you sit comfortably? 20-30 minutes   How long can you stand comfortably? 10 minutes   How long can you walk comfortably? Short distances.   Patient Stated Goals  Decrease pain.   Currently in Pain? Yes   Pain Score 7    Pain Location Back   Pain Orientation Right;Lower   Pain Type Chronic pain   Pain Onset More than a month ago            [facility] PT Assessment - 08/12/16 0001      Assessment   Medical Diagnosis DDD Lumbar with right sciatica.   Onset Date/Surgical Date 06/20/16   Next MD Visit None scheduled     Precautions   Precautions Back     Restrictions   Weight Bearing Restrictions No                     [facility] Adult PT Treatment/Exercise - 08/12/16 0001      Lumbar Exercises: Stretches   Active Hamstring Stretch 3 reps;20 seconds   Active Hamstring Stretch Limitations BLE   Single Knee to Chest Stretch 3 reps;30 seconds   Single Knee to Chest Stretch Limitations BLE     Lumbar Exercises: Aerobic   Stationary Bike Nustep level 5 x 10 minutes.     Lumbar Exercises: Machines for Strengthening   Cybex Knee Extension 10# 2x10 reps     Lumbar Exercises: Supine   Ab Set 20 reps;5 seconds   Bent Knee  Raise 20 reps   Bridge 5 reps   Straight Leg Raise 10 reps   Straight Leg Raises Limitations BLE     Modalities   Modalities Electrical Stimulation     Electrical Stimulation   Electrical Stimulation Location R low back/ buttock   Electrical Stimulation Action Pre-Mod   Electrical Stimulation Parameters 80-150 hz x15 min   Electrical Stimulation Goals Pain                     PT Long Term Goals - 08/12/16 1305      PT LONG TERM GOAL #1   Title Ind with a HEP.   Time 8   Period Weeks   Status Achieved     PT LONG TERM GOAL #2   Title Sit 30 minutes with pain not > 3/10.   Time 8   Period Weeks   Status On-going     PT LONG TERM GOAL #3   Title Stand 30 minutes with pain not > 3/10.   Time 8   Period Weeks   Status On-going     PT LONG TERM GOAL #4   Title Sleep 6 hours undisturbed.   Time 8   Period Weeks   Status On-going     PT LONG TERM GOAL #5   Title Perform ADL's  with pain not > 3-4/10.   Time 8   Period Weeks   Status On-going     PT LONG TERM GOAL #6   Title Bilateral hip flexion= 115 degrees.   Time 8   Period Weeks   Status On-going               Plan - 08/12/16 1344    Clinical Impression Statement Patient arrived to treatment with continued low back pain specifically in R low back. Patient continues to experience pain into R posterior hip as well as into R anterior groin region. HS stretch was attempted with belt as patient reported experiencing HS tightness with stretch reported fairly early. Passive HS stretch may provide better stretch next treatment. Patient again encouraged to complete exercises with core activation to promote core and lumbar strengthening. Patient continues to have pain with SKTC, bridges as well as with SLR. Machine knee extension was initiated today with patient unable to fully extend knees and patient observed rubbing R knee. Patient observed as being compliant with logrolling in clinic and reports that she is going to try to be more compliant at home. Patient was encouraged to continue HEP at home. Normal modality response noted following removal of the modality. Patient reported experiencing continued low back pain following today's treatment.   Rehab Potential Good   Clinical Impairments Affecting Rehab Potential 12 weeks post-op 09/13/2015   PT Frequency 2x / week   PT Duration 8 weeks   PT Treatment/Interventions ADLs/Self Care Home Management;Electrical Stimulation;Moist Heat;Therapeutic exercise;Therapeutic activities;Ultrasound;Patient/family education;Manual techniques;Dry needling   PT Next Visit Plan Progress with core strengthening exercises and modalities PRN for pain. Dry needling.   PT Home Exercise Plan SKTC, Piriformis figure 4 stretch, ab set, SLR   Consulted and Agree with Plan of Care Patient      Patient will benefit from skilled therapeutic intervention in order to improve the following  deficits and impairments:  Pain, Decreased activity tolerance, Decreased range of motion, Decreased strength  Visit Diagnosis: Chronic right-sided low back pain, with sciatica presence unspecified  Muscle weakness (generalized)     Problem List Patient Active Problem List  Diagnosis Date Noted  . Back pain 06/21/2015  . HTN (hypertension) 10/23/2010  . Vitamin D deficiency 10/23/2010  . Hyperlipemia 10/23/2010  . Plantar fasciitis 10/23/2010    Wynelle Fanny, PTA 08/12/2016, 2:00 PM  [facility] [place], [place], [address] Phone: [phone]   Fax:  [phone]  Name: Tammy Mayo MRN: [id number] Date of Birth: 1965-10-15  PHYSICAL THERAPY DISCHARGE SUMMARY  Visits from Start of Care: 3.  Current functional level related to goals / functional outcomes: See above.   Remaining deficits: Patient did not return.   Education / Equipment:  Plan: Patient agrees to discharge.  Patient goals were not met. Patient is being discharged due to not returning since the last visit.  ?????         Mali Applegate MPT

## 2016-08-14 ENCOUNTER — Encounter: Payer: Self-pay | Admitting: Physical Therapy

## 2016-08-17 ENCOUNTER — Emergency Department (HOSPITAL_COMMUNITY)
Admission: EM | Admit: 2016-08-17 | Discharge: 2016-08-17 | Disposition: A | Discharge status: Home Health | Payer: BLUE CROSS/BLUE SHIELD | Attending: Emergency Medicine | Admitting: Emergency Medicine

## 2016-08-17 ENCOUNTER — Emergency Department (HOSPITAL_COMMUNITY): Payer: BLUE CROSS/BLUE SHIELD

## 2016-08-17 ENCOUNTER — Encounter (HOSPITAL_COMMUNITY): Payer: Self-pay | Admitting: Emergency Medicine

## 2016-08-17 DIAGNOSIS — Y939 Activity, unspecified: Secondary | ICD-10-CM | POA: Diagnosis not present

## 2016-08-17 DIAGNOSIS — Y999 Unspecified external cause status: Secondary | ICD-10-CM | POA: Insufficient documentation

## 2016-08-17 DIAGNOSIS — Y92480 Sidewalk as the place of occurrence of the external cause: Secondary | ICD-10-CM | POA: Insufficient documentation

## 2016-08-17 DIAGNOSIS — M542 Cervicalgia: Secondary | ICD-10-CM | POA: Diagnosis not present

## 2016-08-17 DIAGNOSIS — M25561 Pain in right knee: Secondary | ICD-10-CM | POA: Insufficient documentation

## 2016-08-17 DIAGNOSIS — M545 Low back pain: Secondary | ICD-10-CM | POA: Insufficient documentation

## 2016-08-17 DIAGNOSIS — W010XXA Fall on same level from slipping, tripping and stumbling without subsequent striking against object, initial encounter: Secondary | ICD-10-CM | POA: Diagnosis not present

## 2016-08-17 DIAGNOSIS — E119 Type 2 diabetes mellitus without complications: Secondary | ICD-10-CM | POA: Insufficient documentation

## 2016-08-17 DIAGNOSIS — S199XXA Unspecified injury of neck, initial encounter: Secondary | ICD-10-CM | POA: Diagnosis present

## 2016-08-17 DIAGNOSIS — W19XXXA Unspecified fall, initial encounter: Secondary | ICD-10-CM

## 2016-08-17 DIAGNOSIS — Z79899 Other long term (current) drug therapy: Secondary | ICD-10-CM | POA: Diagnosis not present

## 2016-08-17 DIAGNOSIS — I10 Essential (primary) hypertension: Secondary | ICD-10-CM | POA: Insufficient documentation

## 2016-08-17 NOTE — ED Provider Notes (Signed)
[facility]-EMERGENCY DEPT Provider Note   CSN: [id number] Arrival date & time: 08/17/16  1533     History   Chief Complaint Chief Complaint  Patient presents with  . Fall    HPI Tammy Mayo is a 51 y.o. female.  Status post fall on Thursday on icy sidewalk.  Patient complains of neck, low back, right knee pain. No loss of consciousness or neurological deficits. She's been taking tramadol and hydrocodone at home with moderate relief of pain. She has had back surgery in the past. Severity of pain is mild to moderate. Palpation and ambulation make pain worse.      Past Medical History:  Diagnosis Date  . Anemia   . Arthritis   . Chronic back pain    lumbar 4-5 slip and radicular right leg pain  . Diabetes mellitus without complication ([facility])   . GERD (gastroesophageal reflux disease)   . Hyperlipidemia   . Hypertension   . Seasonal allergies   . Vitamin D deficiency   . Wears glasses     Patient Active Problem List   Diagnosis Date Noted  . Back pain 06/21/2015  . HTN (hypertension) 10/23/2010  . Vitamin D deficiency 10/23/2010  . Hyperlipemia 10/23/2010  . Plantar fasciitis 10/23/2010    Past Surgical History:  Procedure Laterality Date  . BACK SURGERY    . CESAREAN SECTION     x 1  . CHOLECYSTECTOMY N/A 11/13/2015   Procedure: LAPAROSCOPIC CHOLECYSTECTOMY;  Surgeon: Emelia Loron, MD;  Location: [facility] OR;  Service: General;  Laterality: N/A;  . DILATION AND CURETTAGE OF UTERUS    . HERNIA REPAIR     umbilical hernia repair as a child  . TUBAL LIGATION      OB History    No data available       Home Medications    Prior to Admission medications   Medication Sig Start Date End Date Taking? Authorizing Provider  cholecalciferol (VITAMIN D) 1000 UNITS tablet Take 1,000 Units by mouth daily.    Historical Provider, MD  Dapagliflozin-Metformin HCl ER (XIGDUO XR) 04-999 MG TB24 Take 1 tablet by mouth daily with supper.    Historical Provider, MD    estradiol (ESTRACE) 1 MG tablet Take 1 mg by mouth daily.    Historical Provider, MD  gabapentin (NEURONTIN) 300 MG capsule Take 300 mg by mouth 1 day or 1 dose.    Historical Provider, MD  hydrochlorothiazide (,MICROZIDE/HYDRODIURIL,) 12.5 MG capsule Take 12.5 mg by mouth daily.      Historical Provider, MD  HYDROcodone-acetaminophen (NORCO/VICODIN) 5-325 MG tablet Take 1-2 tablets by mouth 4 (four) times daily.    Historical Provider, MD  lisinopril (PRINIVIL,ZESTRIL) 40 MG tablet Take 40 mg by mouth daily.      Historical Provider, MD  lubiprostone (AMITIZA) 24 MCG capsule Take 24 mcg by mouth 2 (two) times daily with a meal.    Historical Provider, MD  medroxyPROGESTERone (PROVERA) 2.5 MG tablet Take 2.5 mg by mouth daily.    Historical Provider, MD  methocarbamol (ROBAXIN) 500 MG tablet Take 1 tablet (500 mg total) by mouth 3 (three) times daily as needed for muscle spasms. Patient not taking: Reported on 08/05/2016 06/21/15   Venita Lick, MD  neomycin-bacitracin-polymyxin (NEOSPORIN) ointment Apply 1 application topically daily as needed for wound care.     Historical Provider, MD  ondansetron (ZOFRAN) 4 MG tablet Take 1 tablet (4 mg total) by mouth every 8 (eight) hours as needed for nausea or vomiting.  Patient not taking: Reported on 08/14/2015 06/21/15   Venita Lickahari Brooks, MD  OVER THE COUNTER MEDICATION Take 1 tablet by mouth at bedtime. Cold Relief    Historical Provider, MD  oxyCODONE-acetaminophen (PERCOCET) 10-325 MG tablet Take 1 tablet by mouth every 4 (four) hours as needed for pain. Patient not taking: Reported on 08/05/2016 06/21/15   Venita Lickahari Brooks, MD  oxyCODONE-acetaminophen (PERCOCET) 10-325 MG tablet Take 1 tablet by mouth every 6 (six) hours as needed for pain. Patient not taking: Reported on 08/05/2016 11/13/15 11/12/16  Emelia LoronMatthew Wakefield, MD  oxymetazoline (AFRIN) 0.05 % nasal spray Place 1 spray into both nostrils 2 (two) times daily as needed for congestion (over the counter nasal  spray).    Historical Provider, MD  promethazine (PHENERGAN) 25 MG tablet Take 25 mg by mouth 2 (two) times daily.    Historical Provider, MD  rosuvastatin (CRESTOR) 10 MG tablet Take 10 mg by mouth daily.    Historical Provider, MD  traMADol (ULTRAM) 50 MG tablet Take 50 mg by mouth every 12 (twelve) hours as needed for moderate pain.     Historical Provider, MD  vitamin B-12 (CYANOCOBALAMIN) 500 MCG tablet Take 500 mcg by mouth daily.    Historical Provider, MD    Family History Family History  Problem Relation Age of Onset  . Stroke Mother   . Hypertension Mother   . Diabetes Mother   . Hypertension Father   . Diabetes Father   . Hypertension Sister     Social History Social History  Substance Use Topics  . Smoking status: Never Smoker  . Smokeless tobacco: Never Used  . Alcohol use No     Allergies   Patient has no known allergies.   Review of Systems Review of Systems  All other systems reviewed and are negative.    Physical Exam Updated Vital Signs BP 115/81 (BP Location: Left Arm)   Pulse 119   Temp 98.6 F (37 C) (Oral)   Resp 18   Ht 5\' 4"  (1.626 m)   Wt 211 lb (95.7 kg)   LMP  (LMP Unknown)   SpO2 98%   BMI 36.22 kg/m   Physical Exam  Constitutional: She is oriented to person, place, and time. She appears well-developed and well-nourished.  HENT:  Head: Normocephalic and atraumatic.  Eyes: Conjunctivae are normal.  Neck: Neck supple.  Minimal tenderness posterior cervical spine  Cardiovascular: Normal rate and regular rhythm.   Pulmonary/Chest: Effort normal and breath sounds normal.  Abdominal: Soft. Bowel sounds are normal.  Musculoskeletal: Normal range of motion.  Minimal tenderness lumbar spine and right anterior knee.  Neurological: She is alert and oriented to person, place, and time.  Skin: Skin is warm and dry.  Psychiatric: She has a normal mood and affect. Her behavior is normal.  Nursing note and vitals reviewed.    ED  Treatments / Results  Labs (all labs ordered are listed, but only abnormal results are displayed) Labs Reviewed - No data to display  EKG  EKG Interpretation None       Radiology Dg Cervical Spine Complete  Result Date: 08/17/2016 CLINICAL DATA:  Fall 3 days ago. Right-sided neck pain. Initial encounter. EXAM: CERVICAL SPINE - COMPLETE 4+ VIEW COMPARISON:  None. FINDINGS: There is no evidence of cervical spine fracture or prevertebral soft tissue swelling. Alignment is normal. No other significant bone abnormalities are identified. IMPRESSION: Negative cervical spine radiographs. Electronically Signed   By: Myles RosenthalJohn  Stahl M.D.   On: 08/17/2016  16:53   Dg Lumbar Spine Complete  Result Date: 08/17/2016 CLINICAL DATA:  Fall on Thursday, fell onto right side, pt was on her way to Dr to get Right knee checked out due to pain, Most pain right side of neck and right knee, pt had recent lumbar spine surgery in Nov 2018 EXAM: LUMBAR SPINE - COMPLETE 4+ VIEW COMPARISON:  Lumbar spine CT 02/02/2016 FINDINGS: Posterior lumbar fusion at L4-L5 with pedicle screws and interbody spacer. No subluxation. Hardware is intact. No acute loss vertebral height and disc height. IMPRESSION: No acute findings lumbar spine. Stable postop unchanged compared to CT. Electronically Signed   By: Genevive Bi M.D.   On: 08/17/2016 16:51   Dg Knee Complete 4 Views Right  Result Date: 08/17/2016 CLINICAL DATA:  Fall 3 days ago. Right knee pain. Initial encounter. EXAM: RIGHT KNEE - COMPLETE 4+ VIEW COMPARISON:  None. FINDINGS: No evidence of fracture, dislocation, or joint effusion. No evidence of arthropathy or other focal bone abnormality. Soft tissues are unremarkable. IMPRESSION: Negative. Electronically Signed   By: Myles Rosenthal M.D.   On: 08/17/2016 16:48    Procedures Procedures (including critical care time)  Medications Ordered in ED Medications - No data to display   Initial Impression / Assessment and Plan /  ED Course  I have reviewed the triage vital signs and the nursing notes.  Pertinent labs & imaging results that were available during my care of the patient were reviewed by me and considered in my medical decision making (see chart for details).     Patient is in no acute distress. Plain films of cervical spine, lumbar spine, right knee show no acute injury. Patient has pain medication at home.  Final Clinical Impressions(s) / ED Diagnoses   Final diagnoses:  Fall, initial encounter  Neck pain  Acute pain of right knee  Acute low back pain, unspecified back pain laterality, with sciatica presence unspecified    New Prescriptions New Prescriptions   No medications on file     Donnetta Hutching, MD 08/17/16 1732

## 2016-08-17 NOTE — Discharge Instructions (Signed)
X-ray showed no broken bones. You'll be sore for several days. Ice. Use your pain medication.

## 2016-08-17 NOTE — ED Triage Notes (Signed)
Pt fell and slipped and on right side, pt reports right knee, and hip pain and neck pain. Pt taking hydocodone and tramadol for previous injury/surgery to lower back

## 2016-08-20 ENCOUNTER — Ambulatory Visit: Payer: BLUE CROSS/BLUE SHIELD | Admitting: Physical Therapy

## 2016-08-20 ENCOUNTER — Encounter: Payer: Self-pay | Admitting: Physical Therapy

## 2016-08-20 DIAGNOSIS — M545 Low back pain: Principal | ICD-10-CM

## 2016-08-20 DIAGNOSIS — M6281 Muscle weakness (generalized): Secondary | ICD-10-CM

## 2016-08-20 DIAGNOSIS — G8929 Other chronic pain: Secondary | ICD-10-CM

## 2016-08-20 NOTE — Therapy (Signed)
[facility] [address] [place], [place], [address] Phone: [phone]   Fax:  [phone]  Physical Therapy Treatment  Patient Details  Name: Tammy Mayo MRN: [id number] Date of Birth: June 22, 1966 No Data Recorded  Encounter Date: 08/20/2016      PT End of Session - 08/20/16 1351    Visit Number 4   Number of Visits 16   Date for PT Re-Evaluation 09/30/16   PT Start Time 1347   PT Stop Time 1438   PT Time Calculation (min) 51 min   Activity Tolerance Patient tolerated treatment well   Behavior During Therapy [facility] for tasks assessed/performed      Past Medical History:  Diagnosis Date  . Anemia   . Arthritis   . Chronic back pain    lumbar 4-5 slip and radicular right leg pain  . Diabetes mellitus without complication ([facility])   . GERD (gastroesophageal reflux disease)   . Hyperlipidemia   . Hypertension   . Seasonal allergies   . Vitamin D deficiency   . Wears glasses     Past Surgical History:  Procedure Laterality Date  . BACK SURGERY    . CESAREAN SECTION     x 1  . CHOLECYSTECTOMY N/A 11/13/2015   Procedure: LAPAROSCOPIC CHOLECYSTECTOMY;  Surgeon: Emelia Loron, MD;  Location: [facility] OR;  Service: General;  Laterality: N/A;  . DILATION AND CURETTAGE OF UTERUS    . HERNIA REPAIR     umbilical hernia repair as a child  . TUBAL LIGATION      There were no vitals filed for this visit.      Subjective Assessment - 08/20/16 1349    Subjective Reports that she fell last thursday in her yard on the way to PCP appointment. Reports that she fell on R side and went to ER Saturday after waking with numbness along one side of her face. X rays were taken and MD said it was safe to continue PT. Reports she still has bruising along R knee that is not faded.   Limitations Sitting;Standing   How long can you sit comfortably? 20-30 minutes   How long can you stand comfortably? 10 minutes   How long can you walk  comfortably? Short distances.   Patient Stated Goals Decrease pain.   Currently in Pain? Yes   Pain Score 7    Pain Location Back   Pain Orientation Lower   Pain Descriptors / Indicators Discomfort   Pain Type Acute pain   Pain Onset More than a month ago            [facility] PT Assessment - 08/20/16 0001      Assessment   Medical Diagnosis DDD Lumbar with right sciatica.   Onset Date/Surgical Date 06/20/16   Next MD Visit None scheduled     Precautions   Precautions Back     Restrictions   Weight Bearing Restrictions No                     [facility] Adult PT Treatment/Exercise - 08/20/16 0001      Exercises   Exercises Lumbar     Lumbar Exercises: Aerobic   Stationary Bike Nustep level 5 x 10 minutes.     Lumbar Exercises: Standing   Other Standing Lumbar Exercises R forward step up 4" step x10 reps   two UE support     Lumbar Exercises: Seated   Long Arc Quad on Chair Strengthening;Right;2 sets;10  reps;Weights   LAQ on Chair Weights (lbs) 4   LAQ on Chair Limitations with VCs for erect sitting posture and core activation   Other Seated Lumbar Exercises R ankle DF with 0.5# ankle isolator x20 reps     Lumbar Exercises: Supine   Bent Knee Raise 20 reps   Straight Leg Raise 10 reps   Straight Leg Raises Limitations BLE     Modalities   Modalities Electrical Stimulation;Moist Heat     Moist Heat Therapy   Number Minutes Moist Heat 15 Minutes   Moist Heat Location Lumbar Spine     Electrical Stimulation   Electrical Stimulation Location R low back/ buttock   Electrical Stimulation Action Pre-Mod   Electrical Stimulation Parameters 80-150 hz x15 min   Electrical Stimulation Goals Pain                     PT Long Term Goals - 08/12/16 1305      PT LONG TERM GOAL #1   Title Ind with a HEP.   Time 8   Period Weeks   Status Achieved     PT LONG TERM GOAL #2   Title Sit 30 minutes with pain not > 3/10.   Time 8   Period Weeks    Status On-going     PT LONG TERM GOAL #3   Title Stand 30 minutes with pain not > 3/10.   Time 8   Period Weeks   Status On-going     PT LONG TERM GOAL #4   Title Sleep 6 hours undisturbed.   Time 8   Period Weeks   Status On-going     PT LONG TERM GOAL #5   Title Perform ADL's with pain not > 3-4/10.   Time 8   Period Weeks   Status On-going     PT LONG TERM GOAL #6   Title Bilateral hip flexion= 115 degrees.   Time 8   Period Weeks   Status On-going               Plan - 08/20/16 1427    Clinical Impression Statement Patient arrived to treatment with reports of recent fall in which she landed on R side. Patient reports being give permission by ED MD to return to PT and has not called Dr. Shon BatonBrooks. Patient guided through more sitting and standing exercises with 6/10 low back pain rating provided following the exercises. Patient required moderate multimodal cueing for forward step up to functionally strengthen RLE. 4" step required as patient had increased difficulty with 6" step. Patient continues to complete exercises slowly and carefully. Weakness of LEs continue to be noted with SLR and patient reported 4# ankleweight feeling heavy when donned to RLE. Patient inquired regarding continuation of HEP and patient was highly encouraged to allow HEP to become a strong routine to maitain and increase core and lumbar strengthening. Normal modalities response noted following removal of the modalities. Patient experienced 5/10 low back pain upon end of treatment today and observed using log rolling technique in clinic.   Rehab Potential Good   Clinical Impairments Affecting Rehab Potential 12 weeks post-op 09/13/2015   PT Frequency 2x / week   PT Duration 8 weeks   PT Treatment/Interventions ADLs/Self Care Home Management;Electrical Stimulation;Moist Heat;Therapeutic exercise;Therapeutic activities;Ultrasound;Patient/family education;Manual techniques;Dry needling   PT Next Visit Plan  Progress with core strengthening exercises and modalities PRN for pain. Dry needling.   PT Home Exercise Plan SKTC, Piriformis figure  4 stretch, ab set, SLR   Consulted and Agree with Plan of Care Patient      Patient will benefit from skilled therapeutic intervention in order to improve the following deficits and impairments:  Pain, Decreased activity tolerance, Decreased range of motion, Decreased strength  Visit Diagnosis: Chronic right-sided low back pain, with sciatica presence unspecified  Muscle weakness (generalized)     Problem List Patient Active Problem List   Diagnosis Date Noted  . Back pain 06/21/2015  . HTN (hypertension) 10/23/2010  . Vitamin D deficiency 10/23/2010  . Hyperlipemia 10/23/2010  . Plantar fasciitis 10/23/2010    Evelene Croon, PTA 08/20/2016, 2:44 PM  [facility] [address] [place], [place], [address] Phone: [phone]   Fax:  [phone]  Name: LACYE MCCARN MRN: [id number] Date of Birth: 02-21-1966

## 2016-08-22 ENCOUNTER — Encounter: Payer: Self-pay | Admitting: Physical Therapy

## 2016-08-22 ENCOUNTER — Ambulatory Visit: Payer: BLUE CROSS/BLUE SHIELD | Admitting: Physical Therapy

## 2016-08-22 DIAGNOSIS — M6281 Muscle weakness (generalized): Secondary | ICD-10-CM

## 2016-08-22 DIAGNOSIS — M545 Low back pain: Secondary | ICD-10-CM | POA: Diagnosis not present

## 2016-08-22 DIAGNOSIS — G8929 Other chronic pain: Secondary | ICD-10-CM

## 2016-08-22 NOTE — Therapy (Signed)
[facility] [address] [place], [place], [address] Phone: [phone]   Fax:  [phone]  Physical Therapy Treatment  Patient Details  Name: Tammy Mayo MRN: [id number] Date of Birth: August 29, 1965 No Data Recorded  Encounter Date: 08/22/2016      PT End of Session - 08/22/16 1438    Visit Number 5   Number of Visits 16   Date for PT Re-Evaluation 09/30/16   PT Start Time 1401   PT Stop Time 1456   PT Time Calculation (min) 55 min   Activity Tolerance Patient tolerated treatment well   Behavior During Therapy [facility] for tasks assessed/performed      Past Medical History:  Diagnosis Date  . Anemia   . Arthritis   . Chronic back pain    lumbar 4-5 slip and radicular right leg pain  . Diabetes mellitus without complication (HCC)   . GERD (gastroesophageal reflux disease)   . Hyperlipidemia   . Hypertension   . Seasonal allergies   . Vitamin D deficiency   . Wears glasses     Past Surgical History:  Procedure Laterality Date  . BACK SURGERY    . CESAREAN SECTION     x 1  . CHOLECYSTECTOMY N/A 11/13/2015   Procedure: LAPAROSCOPIC CHOLECYSTECTOMY;  Surgeon: Emelia LoronMatthew Wakefield, MD;  [facility];  Service: General;  Laterality: N/A;  . DILATION AND CURETTAGE OF UTERUS    . HERNIA REPAIR     umbilical hernia repair as a child  . TUBAL LIGATION      There were no vitals filed for this visit.      Subjective Assessment - 08/22/16 1412    Subjective Patient reported some improvement thus far yet ongoing pain in right low back and down LE symptoms   Limitations Sitting;Standing   How long can you sit comfortably? 20-30 minutes   How long can you stand comfortably? 10 minutes   How long can you walk comfortably? Short distances.   Patient Stated Goals Decrease pain.   Currently in Pain? Yes   Pain Score 7    Pain Location Back   Pain Orientation Right;Lower   Pain Descriptors / Indicators Sore   Pain Type Acute  pain   Pain Onset More than a month ago   Pain Frequency Constant   Aggravating Factors  any prolong activity or position   Pain Relieving Factors at rest                          [facility] Treatment/Exercise - 08/22/16 0001      Lumbar Exercises: Aerobic   Stationary Bike Nustep L4x 11 minutes posture focus     Lumbar Exercises: Standing   Other Standing Lumbar Exercises hip abd x10   Other Standing Lumbar Exercises 4" step ups x10     Lumbar Exercises: Seated   Long Arc Quad on Chair Strengthening;Right;2 sets;10 reps;Weights   LAQ on Chair Weights (lbs) 4   Other Seated Lumbar Exercises R ankle DF with 1# ankle isolator 2x10 reps     Lumbar Exercises: Supine   Ab Set 3 seconds;10 reps   Glut Set 3 seconds;Other (comment)  2x10   Bent Knee Raise 3 seconds  2x20   Straight Leg Raise 3 seconds  2x10     Moist Heat Therapy   Number Minutes Moist Heat 15 Minutes   Moist Heat Location Lumbar Spine     Electrical  Stimulation   Electrical Stimulation Location R low back/ buttock   Electrical Stimulation Action premod   Electrical Stimulation Parameters 80-150hz  x30min   Electrical Stimulation Goals Pain                     PT Long Term Goals - 08/12/16 1305      PT LONG TERM GOAL #1   Title Ind with a HEP.   Time 8   Period Weeks   Status Achieved     PT LONG TERM GOAL #2   Title Sit 30 minutes with pain not > 3/10.   Time 8   Period Weeks   Status On-going     PT LONG TERM GOAL #3   Title Stand 30 minutes with pain not > 3/10.   Time 8   Period Weeks   Status On-going     PT LONG TERM GOAL #4   Title Sleep 6 hours undisturbed.   Time 8   Period Weeks   Status On-going     PT LONG TERM GOAL #5   Title Perform ADL's with pain not > 3-4/10.   Time 8   Period Weeks   Status On-going     PT LONG TERM GOAL #6   Title Bilateral hip flexion= 115 degrees.   Time 8   Period Weeks   Status On-going                Plan - 08/22/16 1439    Clinical Impression Statement Patient tolerated treatment well today. Patient able to progress with strengthening and stabilizing exercises with no difficulty. Patient reports doing HEP. Educated patient on posture technque and core stabilization exercises to strengthen back and improve functional independence. Patient has reported increased pain with any prolong activity or prolong position. Current goals ongoing due to pain deficts.   Rehab Potential Good   PT Frequency 2x / week   PT Duration 8 weeks   PT Treatment/Interventions ADLs/Self Care Home Management;Electrical Stimulation;Moist Heat;Therapeutic exercise;Therapeutic activities;Ultrasound;Patient/family education;Manual techniques;Dry needling   PT Next Visit Plan Progress with core strengthening exercises and modalities PRN for pain. Dry needling.   Consulted and Agree with Plan of Care Patient      Patient will benefit from skilled therapeutic intervention in order to improve the following deficits and impairments:  Pain, Decreased activity tolerance, Decreased range of motion, Decreased strength  Visit Diagnosis: Chronic right-sided low back pain, with sciatica presence unspecified  Muscle weakness (generalized)     Problem List Patient Active Problem List   Diagnosis Date Noted  . Back pain 06/21/2015  . HTN (hypertension) 10/23/2010  . Vitamin D deficiency 10/23/2010  . Hyperlipemia 10/23/2010  . Plantar fasciitis 10/23/2010    Cathie Hoops, PTA 08/22/16 3:01 PM [place] MPT [facility] [address] [place], [place], [address] Phone: [phone]   Fax:  [phone]  Name: Tammy Mayo MRN: [id number] Date of Birth: 05/01/1966

## 2016-08-26 ENCOUNTER — Ambulatory Visit: Payer: BLUE CROSS/BLUE SHIELD | Admitting: Physical Therapy

## 2016-08-26 ENCOUNTER — Encounter: Payer: Self-pay | Admitting: Physical Therapy

## 2016-08-26 DIAGNOSIS — G8929 Other chronic pain: Secondary | ICD-10-CM

## 2016-08-26 DIAGNOSIS — M545 Low back pain: Principal | ICD-10-CM

## 2016-08-26 DIAGNOSIS — M6281 Muscle weakness (generalized): Secondary | ICD-10-CM

## 2016-08-26 NOTE — Therapy (Signed)
[facility] [address] [place], [place], [address] Phone: [phone]   Fax:  [phone]  Physical Therapy Treatment  Patient Details  Name: Tammy Mayo MRN: [id number] Date of Birth: [birth year] No Data Recorded  Encounter Date: 08/26/2016      PT End of Session - 08/26/16 1423    Visit Number 6   Number of Visits 16   Date for PT Re-Evaluation 09/30/16   PT Start Time 1401   PT Stop Time 1457   PT Time Calculation (min) 56 min   Activity Tolerance Patient tolerated treatment well   Behavior During Therapy [facility] for tasks assessed/performed      Past Medical History:  Diagnosis Date  . Anemia   . Arthritis   . Chronic back pain    lumbar 4-5 slip and radicular right leg pain  . Diabetes mellitus without complication (HCC)   . GERD (gastroesophageal reflux disease)   . Hyperlipidemia   . Hypertension   . Seasonal allergies   . Vitamin D deficiency   . Wears glasses     Past Surgical History:  Procedure Laterality Date  . BACK SURGERY    . CESAREAN SECTION     x 1  . CHOLECYSTECTOMY N/A 11/13/2015   Procedure: LAPAROSCOPIC CHOLECYSTECTOMY;  Surgeon: Emelia Loron, MD;  Location: [facility] OR;  Service: General;  Laterality: N/A;  . DILATION AND CURETTAGE OF UTERUS    . HERNIA REPAIR     umbilical hernia repair as a child  . TUBAL LIGATION      There were no vitals filed for this visit.      Subjective Assessment - 08/26/16 1404    Subjective Patient reported improvement today   Limitations Sitting;Standing   How long can you sit comfortably? 20-30 minutes   How long can you stand comfortably? 10 minutes   How long can you walk comfortably? Short distances.   Patient Stated Goals Decrease pain.   Currently in Pain? Yes   Pain Score 6    Pain Location Back   Pain Orientation Right;Lower   Pain Descriptors / Indicators Sore   Pain Type Acute pain   Pain Onset More than a month ago   Pain Frequency  Constant   Aggravating Factors  any prolong activity   Pain Relieving Factors at rest                         [facility] Adult PT Treatment/Exercise - 08/26/16 0001      Lumbar Exercises: Aerobic   Stationary Bike Nustep L4x 10 minutes posture focus     Lumbar Exercises: Seated   Long Arc Quad on Chair Strengthening;Right;2 sets;10 reps;Weights   LAQ on Chair Weights (lbs) 4   Other Seated Lumbar Exercises R ankle DF with 1# ankle isolator 2x10 reps     Lumbar Exercises: Supine   Ab Set 3 seconds;20 reps   Glut Set 20 reps;3 seconds   Clam 3 seconds  3x10 with Red t-band   Bent Knee Raise 3 seconds  2x20   Straight Leg Raise 3 seconds  2x10   Other Supine Lumbar Exercises seated for scap retraction/posture focus red t-band 2x10     Moist Heat Therapy   Number Minutes Moist Heat 15 Minutes   Moist Heat Location Lumbar Spine     Electrical Stimulation   Electrical Stimulation Location R low back/ buttock   Electrical Stimulation Action premod   Electrical Stimulation  Parameters 80-150hz x3815min   Electrical Stimulation Goals Pain                     PT Long Term Goals - 08/12/16 1305      PT LONG TERM GOAL #1   Title Ind with a HEP.   Time 8   Period Weeks   Status Achieved     PT LONG TERM GOAL #2   Title Sit 30 minutes with pain not > 3/10.   Time 8   Period Weeks   Status On-going     PT LONG TERM GOAL #3   Title Stand 30 minutes with pain not > 3/10.   Time 8   Period Weeks   Status On-going     PT LONG TERM GOAL #4   Title Sleep 6 hours undisturbed.   Time 8   Period Weeks   Status On-going     PT LONG TERM GOAL #5   Title Perform ADL's with pain not > 3-4/10.   Time 8   Period Weeks   Status On-going     PT LONG TERM GOAL #6   Title Bilateral hip flexion= 115 degrees.   Time 8   Period Weeks   Status On-going               Plan - 08/26/16 1426    Clinical Impression Statement Patient progressing with good  response to treatments and no reported pain increase. Patient progressing with core strengthening in all positions with good technique. Patient has reported overall progress thus far. Contined flare ups of pain reported with increased activity. Current goals ongoing due to pain deficts.    Rehab Potential Good   Clinical Impairments Affecting Rehab Potential 12 weeks post-op 09/13/2015   PT Frequency 2x / week   PT Duration 8 weeks   PT Treatment/Interventions ADLs/Self Care Home Management;Electrical Stimulation;Moist Heat;Therapeutic exercise;Therapeutic activities;Ultrasound;Patient/family education;Manual techniques;Dry needling   PT Next Visit Plan Progress with core strengthening exercises and modalities PRN for pain. Dry needling.   Consulted and Agree with Plan of Care Patient      Patient will benefit from skilled therapeutic intervention in order to improve the following deficits and impairments:  Pain, Decreased activity tolerance, Decreased range of motion, Decreased strength  Visit Diagnosis: Chronic right-sided low back pain, with sciatica presence unspecified  Muscle weakness (generalized)     Problem List Patient Active Problem List   Diagnosis Date Noted  . Back pain 06/21/2015  . HTN (hypertension) 10/23/2010  . Vitamin D deficiency 10/23/2010  . Hyperlipemia 10/23/2010  . Plantar fasciitis 10/23/2010    Hermelinda DellenDUNFORD, Cameran Ahmed P, PTA 08/26/2016, 2:57 PM  [facility] [address] [place], [place], [address] Phone: [phone]   Fax:  [phone]  Name: Hassan Bucklerlizabeth A Baka MRN: [id number] Date of Birth: 02-May-1966

## 2016-08-28 ENCOUNTER — Encounter: Payer: Self-pay | Admitting: Physical Therapy

## 2016-08-28 ENCOUNTER — Ambulatory Visit: Payer: BLUE CROSS/BLUE SHIELD | Admitting: Physical Therapy

## 2016-08-28 DIAGNOSIS — G8929 Other chronic pain: Secondary | ICD-10-CM

## 2016-08-28 DIAGNOSIS — M545 Low back pain: Secondary | ICD-10-CM | POA: Diagnosis not present

## 2016-08-28 DIAGNOSIS — M6281 Muscle weakness (generalized): Secondary | ICD-10-CM

## 2016-08-28 NOTE — Therapy (Signed)
[facility] [address] [place], [place], [address] Phone: [phone]   Fax:  [phone]  Physical Therapy Treatment  Patient Details  Name: Tammy Mayo MRN: [id number] Date of Birth: 06-16-1966 No Data Recorded  Encounter Date: 08/28/2016      PT End of Session - 08/28/16 1347    Visit Number 7   Number of Visits 16   Date for PT Re-Evaluation 09/30/16   PT Start Time 1346   PT Stop Time 1439   PT Time Calculation (min) 53 min   Activity Tolerance Patient tolerated treatment well   Behavior During Therapy [facility] for tasks assessed/performed      Past Medical History:  Diagnosis Date  . Anemia   . Arthritis   . Chronic back pain    lumbar 4-5 slip and radicular right leg pain  . Diabetes mellitus without complication (HCC)   . GERD (gastroesophageal reflux disease)   . Hyperlipidemia   . Hypertension   . Seasonal allergies   . Vitamin D deficiency   . Wears glasses     Past Surgical History:  Procedure Laterality Date  . BACK SURGERY    . CESAREAN SECTION     x 1  . CHOLECYSTECTOMY N/A 11/13/2015   Procedure: LAPAROSCOPIC CHOLECYSTECTOMY;  Surgeon: Emelia LoronMatthew Wakefield, MD;  [facility];  Service: General;  Laterality: N/A;  . DILATION AND CURETTAGE OF UTERUS    . HERNIA REPAIR     umbilical hernia repair as a child  . TUBAL LIGATION      There were no vitals filed for this visit.      Subjective Assessment - 08/28/16 1347    Subjective Reports that pain is still in low back. Reports that she talked to Dr. Charm BargesButler about her concerns and Dr. Charm BargesButler assured her concerns.   Limitations Sitting;Standing   How long can you sit comfortably? 20-30 minutes   How long can you stand comfortably? 10 minutes   How long can you walk comfortably? Short distances.   Patient Stated Goals Decrease pain.   Currently in Pain? Yes   Pain Score 7    Pain Location Back   Pain Orientation Right;Lower   Pain Descriptors /  Indicators Sore   Pain Type Acute pain   Pain Onset More than a month ago            [facility] - 08/28/16 0001      Assessment   Medical Diagnosis DDD Lumbar with right sciatica.   Onset Date/Surgical Date 06/20/16   Next MD Visit None scheduled     Precautions   Precautions Back     Restrictions   Weight Bearing Restrictions No                     [facility] Adult PT Treatment/Exercise - 08/28/16 0001      Lumbar Exercises: Aerobic   Stationary Bike Nustep L4 x 10 minutes posture focus     Lumbar Exercises: Standing   Row Strengthening;Both;20 reps   Row Limitations Pink XTS     Lumbar Exercises: Seated   Long Arc Quad on Chair Strengthening;Right;3 sets;10 reps;Weights   LAQ on Chair Weights (lbs) 4   Other Seated Lumbar Exercises R ankle DF with 1# ankle isolator 2x10 reps     Lumbar Exercises: Supine   Clam 20 reps   Bent Knee Raise 20 reps;3 seconds   Straight Leg Raise 20 reps  Modalities   Modalities Electrical Stimulation;Moist Heat     Moist Heat Therapy   Number Minutes Moist Heat 15 Minutes   Moist Heat Location Lumbar Spine     Electrical Stimulation   Electrical Stimulation Location B lumbar paraspinals   Electrical Stimulation Action Pre-Mod   Electrical Stimulation Parameters 80-150 hzx15 min   Electrical Stimulation Goals Pain                     PT Long Term Goals - 08/12/16 1305      PT LONG TERM GOAL #1   Title Ind with a HEP.   Time 8   Period Weeks   Status Achieved     PT LONG TERM GOAL #2   Title Sit 30 minutes with pain not > 3/10.   Time 8   Period Weeks   Status On-going     PT LONG TERM GOAL #3   Title Stand 30 minutes with pain not > 3/10.   Time 8   Period Weeks   Status On-going     PT LONG TERM GOAL #4   Title Sleep 6 hours undisturbed.   Time 8   Period Weeks   Status On-going     PT LONG TERM GOAL #5   Title Perform ADL's with pain not > 3-4/10.   Time 8   Period  Weeks   Status On-going     PT LONG TERM GOAL #6   Title Bilateral hip flexion= 115 degrees.   Time 8   Period Weeks   Status On-going               Plan - 08/28/16 1425    Clinical Impression Statement Patient arrived to treatment with continued low back discomfort and soreness. Patient able to complete exercises as directed although slowly and carefully. Patient had no reports of increased pain with exercises although facial grimacing observed with sitting position. Patient compliant with log rolling technique in clinic. Normal modalities response noted following removal of the modalities. Patient educated of surgery and she will have to be careful with activities in order to not harm herself or cause more pain. Patient also educated of therapy procress and protocols to assure of no harm in therapy as we use caution to not cause more pain.   Rehab Potential Good   Clinical Impairments Affecting Rehab Potential 12 weeks post-op 09/13/2015   PT Frequency 2x / week   PT Duration 8 weeks   PT Treatment/Interventions ADLs/Self Care Home Management;Electrical Stimulation;Moist Heat;Therapeutic exercise;Therapeutic activities;Ultrasound;Patient/family education;Manual techniques;Dry needling   PT Next Visit Plan Progress with core strengthening exercises and modalities PRN for pain. Dry needling.   PT Home Exercise Plan SKTC, Piriformis figure 4 stretch, ab set, SLR   Consulted and Agree with Plan of Care Patient      Patient will benefit from skilled therapeutic intervention in order to improve the following deficits and impairments:  Pain, Decreased activity tolerance, Decreased range of motion, Decreased strength  Visit Diagnosis: Chronic right-sided low back pain, with sciatica presence unspecified  Muscle weakness (generalized)     Problem List Patient Active Problem List   Diagnosis Date Noted  . Back pain 06/21/2015  . HTN (hypertension) 10/23/2010  . Vitamin D  deficiency 10/23/2010  . Hyperlipemia 10/23/2010  . Plantar fasciitis 10/23/2010    Evelene Croon, PTA 08/28/2016, 2:47 PM  [facility] [address] [place], [place], [address] Phone: [phone]  Fax:  [phone]  Name: Tammy Mayo MRN: [id number] Date of Birth: [birth year]

## 2016-09-02 ENCOUNTER — Encounter: Payer: Self-pay | Admitting: Physical Therapy

## 2016-09-02 ENCOUNTER — Ambulatory Visit: Payer: BLUE CROSS/BLUE SHIELD | Attending: *Deleted | Admitting: Physical Therapy

## 2016-09-02 DIAGNOSIS — G8929 Other chronic pain: Secondary | ICD-10-CM | POA: Insufficient documentation

## 2016-09-02 DIAGNOSIS — M545 Low back pain: Secondary | ICD-10-CM | POA: Diagnosis not present

## 2016-09-02 DIAGNOSIS — M6281 Muscle weakness (generalized): Secondary | ICD-10-CM | POA: Diagnosis present

## 2016-09-02 NOTE — Therapy (Signed)
[facility] [address] [place], [place], [address] Phone: [phone]   Fax:  [phone]  Physical Therapy Treatment  Patient Details  Name: Tammy Bucklerlizabeth A Wamser MRN: [id number] Date of Birth: 04-05-1966 No Data Recorded  Encounter Date: 09/02/2016      PT End of Session - 09/02/16 1302    Visit Number 8   Number of Visits 16   Date for PT Re-Evaluation 09/30/16   PT Start Time 1300   PT Stop Time 1352   PT Time Calculation (min) 52 min   Activity Tolerance Patient tolerated treatment well   Behavior During Therapy [facility] for tasks assessed/performed      Past Medical History:  Diagnosis Date  . Anemia   . Arthritis   . Chronic back pain    lumbar 4-5 slip and radicular right leg pain  . Diabetes mellitus without complication (HCC)   . GERD (gastroesophageal reflux disease)   . Hyperlipidemia   . Hypertension   . Seasonal allergies   . Vitamin D deficiency   . Wears glasses     Past Surgical History:  Procedure Laterality Date  . BACK SURGERY    . CESAREAN SECTION     x 1  . CHOLECYSTECTOMY N/A 11/13/2015   Procedure: LAPAROSCOPIC CHOLECYSTECTOMY;  Surgeon: Emelia LoronMatthew Wakefield, MD;  [facility];  Service: General;  Laterality: N/A;  . DILATION AND CURETTAGE OF UTERUS    . HERNIA REPAIR     umbilical hernia repair as a child  . TUBAL LIGATION      There were no vitals filed for this visit.      Subjective Assessment - 09/02/16 1301    Subjective Reports that low back pain is still present with pain especially with rainy weather.   Limitations Sitting;Standing   How long can you sit comfortably? 20-30 minutes   How long can you stand comfortably? 10 minutes   How long can you walk comfortably? Short distances.   Patient Stated Goals Decrease pain.   Currently in Pain? Yes   Pain Score 7    Pain Location Back   Pain Orientation Right;Lower   Pain Descriptors / Indicators Discomfort   Pain Type Acute pain   Pain Onset More than a month ago            [facility] Assessment - 09/02/16 0001      Assessment   Medical Diagnosis DDD Lumbar with right sciatica.   Onset Date/Surgical Date 06/20/16   Next MD Visit None scheduled     Precautions   Precautions Back     Restrictions   Weight Bearing Restrictions No                     [facility] Adult PT Treatment/Exercise - 09/02/16 0001      Lumbar Exercises: Aerobic   Stationary Bike Nustep L5 x 12 minutes posture focus     Lumbar Exercises: Standing   Heel Raises Limitations B toe raises x15 reps   Row Strengthening;Both;20 reps   Row Limitations Pink XTS   Other Standing Lumbar Exercises 4" step ups x15 reps     Lumbar Exercises: Supine   Bent Knee Raise 20 reps;3 seconds   Bridge 10 reps   Straight Leg Raise 20 reps   Straight Leg Raises Limitations BLE     Modalities   Modalities Electrical Stimulation;Moist Heat     Moist Heat Therapy   Number Minutes Moist Heat 15 Minutes  Moist Heat Location Lumbar Spine     Electrical Stimulation   Electrical Stimulation Location B lumbar paraspinals   Electrical Stimulation Action IFC   Electrical Stimulation Parameters 1-10 hz x15 min   Electrical Stimulation Goals Pain                     PT Long Term Goals - 08/12/16 1305      PT LONG TERM GOAL #1   Title Ind with a HEP.   Time 8   Period Weeks   Status Achieved     PT LONG TERM GOAL #2   Title Sit 30 minutes with pain not > 3/10.   Time 8   Period Weeks   Status On-going     PT LONG TERM GOAL #3   Title Stand 30 minutes with pain not > 3/10.   Time 8   Period Weeks   Status On-going     PT LONG TERM GOAL #4   Title Sleep 6 hours undisturbed.   Time 8   Period Weeks   Status On-going     PT LONG TERM GOAL #5   Title Perform ADL's with pain not > 3-4/10.   Time 8   Period Weeks   Status On-going     PT LONG TERM GOAL #6   Title Bilateral hip flexion= 115 degrees.   Time 8    Period Weeks   Status On-going               Plan - 09/02/16 1340    Clinical Impression Statement Patient arrived to treatment with continued low back pain. Patient able to complete exercises although slowly and with facial grimacing with standing rows. Patient inquired of how to descend the stairs at her home and educated that until her RLE becomes stronger than descend leading with LLE. Patient continues to have greatest difficulty with supine bridges and SLR. Patient now experiencing greater ease with log rolling technique per patient report. Normal modalities response noted following removal of the modalities.    Rehab Potential Good   Clinical Impairments Affecting Rehab Potential 12 weeks post-op 09/13/2015   PT Frequency 2x / week   PT Duration 8 weeks   PT Treatment/Interventions ADLs/Self Care Home Management;Electrical Stimulation;Moist Heat;Therapeutic exercise;Therapeutic activities;Ultrasound;Patient/family education;Manual techniques;Dry needling   PT Next Visit Plan Progress with core strengthening exercises and modalities PRN for pain. Dry needling.   PT Home Exercise Plan SKTC, Piriformis figure 4 stretch, ab set, SLR   Consulted and Agree with Plan of Care Patient      Patient will benefit from skilled therapeutic intervention in order to improve the following deficits and impairments:  Pain, Decreased activity tolerance, Decreased range of motion, Decreased strength  Visit Diagnosis: Chronic right-sided low back pain, with sciatica presence unspecified  Muscle weakness (generalized)     Problem List Patient Active Problem List   Diagnosis Date Noted  . Back pain 06/21/2015  . HTN (hypertension) 10/23/2010  . Vitamin D deficiency 10/23/2010  . Hyperlipemia 10/23/2010  . Plantar fasciitis 10/23/2010    Evelene Croon, PTA 09/02/2016, 1:59 PM  [facility] [address] [place], [place], [address] Phone:  [phone]   Fax:  [phone]  Name: Tammy Mayo MRN: [id number] Date of Birth: [birth year]

## 2016-09-03 ENCOUNTER — Ambulatory Visit: Payer: BLUE CROSS/BLUE SHIELD | Admitting: Physical Therapy

## 2016-09-03 DIAGNOSIS — M545 Low back pain: Principal | ICD-10-CM

## 2016-09-03 DIAGNOSIS — G8929 Other chronic pain: Secondary | ICD-10-CM

## 2016-09-03 NOTE — Therapy (Signed)
[facility] [address] [place], [place], [address] Phone: [phone]   Fax:  [phone]  Physical Therapy Treatment  Patient Details  Name: Tammy Mayo MRN: [id number] Date of Birth: [birth year] No Data Recorded  Encounter Date: 09/03/2016      PT End of Session - 09/03/16 0902    Visit Number 9   Number of Visits 16   Date for PT Re-Evaluation 09/30/16   PT Start Time 0901   PT Stop Time 1001   PT Time Calculation (min) 60 min   Activity Tolerance Patient tolerated treatment well   Behavior During Therapy [facility] for tasks assessed/performed      Past Medical History:  Diagnosis Date  . Anemia   . Arthritis   . Chronic back pain    lumbar 4-5 slip and radicular right leg pain  . Diabetes mellitus without complication (HCC)   . GERD (gastroesophageal reflux disease)   . Hyperlipidemia   . Hypertension   . Seasonal allergies   . Vitamin D deficiency   . Wears glasses     Past Surgical History:  Procedure Laterality Date  . BACK SURGERY    . CESAREAN SECTION     x 1  . CHOLECYSTECTOMY N/A 11/13/2015   Procedure: LAPAROSCOPIC CHOLECYSTECTOMY;  Surgeon: Emelia LoronMatthew Wakefield, MD;  [facility];  Service: General;  Laterality: N/A;  . DILATION AND CURETTAGE OF UTERUS    . HERNIA REPAIR     umbilical hernia repair as a child  . TUBAL LIGATION      There were no vitals filed for this visit.      Subjective Assessment - 09/03/16 0903    Subjective Patient reports she has 8/10 pain today in her right low back and hip.    Limitations Sitting;Standing   Currently in Pain? Yes   Pain Score 8    Pain Location Back   Pain Orientation Right;Lower   Pain Descriptors / Indicators Discomfort   Pain Type Acute pain   Pain Onset More than a month ago   Pain Frequency Constant   Aggravating Factors  prolonged activity   Pain Relieving Factors at rest            [facility] PT Assessment - 09/03/16 0001       Posture/Postural Control   Posture Comments depressed R shoulder and tight QL; patient leans to R in standing; slight forward flexion in standing                     [facility] Adult PT Treatment/Exercise - 09/03/16 0001      Modalities   Modalities Electrical Stimulation;Moist Heat     Moist Heat Therapy   Moist Heat Location Lumbar Spine     Electrical Stimulation   Electrical Stimulation Location B lumbar and R gluteals   Electrical Stimulation Action IFC   Electrical Stimulation Parameters 80-150 Hz x 15 min   Electrical Stimulation Goals Pain     Manual Therapy   Manual Therapy Soft tissue mobilization   Soft tissue mobilization to R lumbar/QL, gluteals and piriformis          Trigger Point Dry Needling - 09/03/16 1022    Consent Given? Yes   Education Handout Provided Yes   Muscles Treated Upper Body Quadratus Lumborum;Longissimus  R   Muscles Treated Lower Body Gluteus minimus;Gluteus maximus;Piriformis  R   Longissimus Response Twitch response elicited;Palpable increased muscle length   Gluteus Maximus Response Twitch response  elicited;Palpable increased muscle length   Gluteus Minimus Response Twitch response elicited;Palpable increased muscle length   Piriformis Response Twitch response elicited;Palpable increased muscle length              PT Education - 09/03/16 1024    Education provided Yes   Education Details HEP; DN education and aftercare; tennis ball for MFR in gluteals   Person(s) Educated Patient   Methods Explanation;Demonstration;Handout   Comprehension Verbalized understanding;Returned demonstration             PT Long Term Goals - 09/03/16 1030      PT LONG TERM GOAL #1   Title Ind with a HEP.   Time 8   Period Weeks   Status Achieved     PT LONG TERM GOAL #2   Title Sit 30 minutes with pain not > 3/10.   Time 8   Period Weeks   Status On-going     PT LONG TERM GOAL #3   Title Stand 30 minutes with pain not >  3/10.   Time 8   Period Weeks   Status On-going     PT LONG TERM GOAL #4   Title Sleep 6 hours undisturbed.   Time 8   Period Weeks   Status On-going     PT LONG TERM GOAL #5   Title Perform ADL's with pain not > 3-4/10.   Time 8   Period Weeks   Status On-going     PT LONG TERM GOAL #6   Title Bilateral hip flexion= 115 degrees.   Time 8   Period Weeks   Status On-going               Plan - 09/03/16 1025    Clinical Impression Statement Patient presents with continued c/o low back pain. She stands with decreased WB on R side and has a depressed R shoulder and tight QL. She responded very well to TPDN with ++ Localized twitch responses in all muscles needled. She had relief in [facility] position over pillow with pillow between knees and will try this position for sleeping. She reports decreased confidence in standing on RLE and PT encouraged weight shifting and SLS at countertop at home. She had a normal response to modalities. LTGs are ongoing.   Rehab Potential Good   Clinical Impairments Affecting Rehab Potential 12 weeks post-op 09/13/2015   PT Frequency 2x / week   PT Duration 8 weeks   PT Treatment/Interventions ADLs/Self Care Home Management;Electrical Stimulation;Moist Heat;Therapeutic exercise;Therapeutic activities;Ultrasound;Patient/family education;Manual techniques;Dry needling   PT Next Visit Plan Assess dry needling. Work on Engineer, agricultural and confidence in RLE strength with functional exercises; Progress with core strengthening exercises and modalities PRN for pain.    PT Home Exercise Plan SDLY QL stretch; SLS and weight shifting at counter; SKTC, Piriformis figure 4 stretch, ab set, SLR   Consulted and Agree with Plan of Care Patient      Patient will benefit from skilled therapeutic intervention in order to improve the following deficits and impairments:  Pain, Decreased activity tolerance, Decreased range of motion, Decreased strength  Visit  Diagnosis: Chronic right-sided low back pain, with sciatica presence unspecified     Problem List Patient Active Problem List   Diagnosis Date Noted  . Back pain 06/21/2015  . HTN (hypertension) 10/23/2010  . Vitamin D deficiency 10/23/2010  . Hyperlipemia 10/23/2010  . Plantar fasciitis 10/23/2010   Solon Palm PT 09/03/2016, 10:36 AM  [facility]  [address] [place], [place], [address] Phone: [phone]   Fax:  [phone]  Name: Tammy Mayo MRN: [id number] Date of Birth: 1965/08/20

## 2016-09-03 NOTE — Patient Instructions (Addendum)
Trigger Point Dry Needling  . What is Trigger Point Dry Needling (DN)? o DN is a physical therapy technique used to treat muscle pain and dysfunction. Specifically, DN helps deactivate muscle trigger points (muscle knots).  o A thin filiform needle is used to penetrate the skin and stimulate the underlying trigger point. The goal is for a local twitch response (LTR) to occur and for the trigger point to relax. No medication of any kind is injected during the procedure.   . What Does Trigger Point Dry Needling Feel Like?  o The procedure feels different for each individual patient. Some patients report that they do not actually feel the needle enter the skin and overall the process is not painful. Very mild bleeding may occur. However, many patients feel a deep cramping in the muscle in which the needle was inserted. This is the local twitch response.   Marland Kitchen. How Will I feel after the treatment? o Soreness is normal, and the onset of soreness may not occur for a few hours. Typically this soreness does not last longer than two days.  o Bruising is uncommon, however; ice can be used to decrease any possible bruising.  o In rare cases feeling tired or nauseous after the treatment is normal. In addition, your symptoms may get worse before they get better, this period will typically not last longer than 24 hours.   . What Can I do After My Treatment? o Increase your hydration by drinking more water for the next 24 hours. o You may place ice or heat on the areas treated that have become sore, however, do not use heat on inflamed or bruised areas. Heat often brings more relief post needling. o You can continue your regular activities, but vigorous activity is not recommended initially after the treatment for 24 hours. o DN is best combined with other physical therapy such as strengthening, stretching, and other therapies.    Precautions:  In some cases, dry needling is done over the lung field. While rare,  there is a risk of pneumothorax (punctured lung). Because of this, if you ever experience shortness of breath on exertion, difficulty taking a deep breath, chest pain or a dry cough following dry needling, you should report to an emergency room and tell them that you have been dry needled over the thorax.    Iliotibial Band Stretch, Side-Lying   Lie on side, back to edge of bed, top arm in front. Allow top leg to drape behind over edge. Hold 30 or more seconds.  Repeat 3 times per session. Do _2-3 sessions per day.   09/03/16 9:06 AM [facility] [address] [place], [place], [address] Phone: [phone]   Fax:  [phone]

## 2016-09-09 ENCOUNTER — Encounter: Payer: Self-pay | Admitting: Physical Therapy

## 2016-09-09 ENCOUNTER — Ambulatory Visit: Payer: BLUE CROSS/BLUE SHIELD | Admitting: Physical Therapy

## 2016-09-09 DIAGNOSIS — M545 Low back pain: Secondary | ICD-10-CM | POA: Diagnosis not present

## 2016-09-09 DIAGNOSIS — G8929 Other chronic pain: Secondary | ICD-10-CM

## 2016-09-09 DIAGNOSIS — M6281 Muscle weakness (generalized): Secondary | ICD-10-CM

## 2016-09-09 NOTE — Therapy (Signed)
[facility] [address] [place], [place], [address] Phone: [phone]   Fax:  [phone]  Physical Therapy Treatment  Patient Details  Name: Tammy Mayo MRN: [id number] Date of Birth: 02/28/1966 No Data Recorded  Encounter Date: 09/09/2016      PT End of Session - 09/09/16 1424    Visit Number 10   Number of Visits 16   Date for PT Re-Evaluation 09/30/16   PT Start Time 1359   PT Stop Time 1449   PT Time Calculation (min) 50 min   Activity Tolerance Patient tolerated treatment well   Behavior During Therapy [facility] for tasks assessed/performed      Past Medical History:  Diagnosis Date  . Anemia   . Arthritis   . Chronic back pain    lumbar 4-5 slip and radicular right leg pain  . Diabetes mellitus without complication (HCC)   . GERD (gastroesophageal reflux disease)   . Hyperlipidemia   . Hypertension   . Seasonal allergies   . Vitamin D deficiency   . Wears glasses     Past Surgical History:  Procedure Laterality Date  . BACK SURGERY    . CESAREAN SECTION     x 1  . CHOLECYSTECTOMY N/A 11/13/2015   Procedure: LAPAROSCOPIC CHOLECYSTECTOMY;  Surgeon: Emelia Loron, MD;  [facility];  Service: General;  Laterality: N/A;  . DILATION AND CURETTAGE OF UTERUS    . HERNIA REPAIR     umbilical hernia repair as a child  . TUBAL LIGATION      There were no vitals filed for this visit.      Subjective Assessment - 09/09/16 1407    Subjective Patient reported doing good after DN session and other symptoms " feels the same".   Limitations Sitting;Standing   How long can you sit comfortably? 20-30 minutes   How long can you stand comfortably? 10 minutes   How long can you walk comfortably? Short distances.   Patient Stated Goals Decrease pain.   Currently in Pain? Yes   Pain Score 7    Pain Location Back   Pain Orientation Right;Lower   Pain Descriptors / Indicators Discomfort   Pain Type Acute pain    Pain Onset More than a month ago   Pain Frequency Constant   Aggravating Factors  prolong activity   Pain Relieving Factors at rest            [facility] PT Assessment - 09/09/16 0001      ROM / Strength   AROM / PROM / Strength AROM     AROM   AROM Assessment Site Hip   Right/Left Hip Right;Left   Right Hip Flexion 79   Left Hip Flexion 95                     [facility] Adult PT Treatment/Exercise - 09/09/16 0001      Lumbar Exercises: Aerobic   Stationary Bike Nustep L5 x 12 minutes posture focus     Lumbar Exercises: Standing   Row Strengthening;Both;20 reps   Row Limitations Pink XTS   Shoulder Extension Strengthening;Both;20 reps   Shoulder Extension Limitations Pink XTS   Other Standing Lumbar Exercises 6" step ups 2x10     Lumbar Exercises: Supine   Bent Knee Raise 20 reps;3 seconds   Bridge 10 reps   Straight Leg Raise 20 reps   Straight Leg Raises Limitations BLE     Moist Heat Therapy  Number Minutes Moist Heat 15 Minutes   Moist Heat Location Lumbar Spine     Electrical Stimulation   Electrical Stimulation Location B lumbar and R gluteals   Electrical Stimulation Action IFC   Electrical Stimulation Parameters 80-150hz  x9415min   Electrical Stimulation Goals Pain                     PT Long Term Goals - 09/09/16 1425      PT LONG TERM GOAL #1   Title Ind with a HEP.   Time 8   Period Weeks   Status Achieved     PT LONG TERM GOAL #2   Title Sit 30 minutes with pain not > 3/10.   Time 8   Period Weeks   Status On-going  pain up to 7/10 09/09/16     PT LONG TERM GOAL #3   Title Stand 30 minutes with pain not > 3/10.   Time 8   Period Weeks   Status On-going  unable due to increased pain 09/09/16     PT LONG TERM GOAL #4   Title Sleep 6 hours undisturbed.   Time 8   Period Weeks   Status On-going  5 hours at most 09/09/16     PT LONG TERM GOAL #5   Title Perform ADL's with pain not > 3-4/10.   Time 8   Period Weeks    Status On-going  8/10 with ADL's 09/09/16     PT LONG TERM GOAL #6   Title Bilateral hip flexion= 115 degrees.   Time 8   Period Weeks   Status On-going               Plan - 09/09/16 1433    Clinical Impression Statement Patient tolerated treatment with no complaits of pain and able to complete all exercise with ease. Patient reported doing well with the dry needle session and felt improvement. Patient arrived with less pain today. Patient unable to perform any prolong sitting or standing or normal ADL's due to reported increased pain. Patient current goals ongoing due to pain deficts.    Rehab Potential Good   Clinical Impairments Affecting Rehab Potential 12 weeks post-op 09/13/2015   PT Frequency 2x / week   PT Duration 8 weeks   PT Treatment/Interventions ADLs/Self Care Home Management;Electrical Stimulation;Moist Heat;Therapeutic exercise;Therapeutic activities;Ultrasound;Patient/family education;Manual techniques;Dry needling   PT Next Visit Plan cont with dry needling per patient and Work on gaining strength and confidence in RLE strength with functional exercises; Progress with core strengthening exercises and modalities PRN for pain per MPT   Consulted and Agree with Plan of Care Patient      Patient will benefit from skilled therapeutic intervention in order to improve the following deficits and impairments:  Pain, Decreased activity tolerance, Decreased range of motion, Decreased strength  Visit Diagnosis: Chronic right-sided low back pain, with sciatica presence unspecified  Muscle weakness (generalized)     Problem List Patient Active Problem List   Diagnosis Date Noted  . Back pain 06/21/2015  . HTN (hypertension) 10/23/2010  . Vitamin D deficiency 10/23/2010  . Hyperlipemia 10/23/2010  . Plantar fasciitis 10/23/2010   Cathie HoopsChristina Clydette Privitera, PTA 09/09/16 4:48 PM [place] MPT [facility] [address] [place], [place], [address] Phone: [phone]   Fax:  [phone]  Name: Tammy Mayo MRN: [id number] Date of Birth: [birth year]

## 2016-09-10 ENCOUNTER — Ambulatory Visit: Payer: BLUE CROSS/BLUE SHIELD | Admitting: Physical Therapy

## 2016-09-10 DIAGNOSIS — M545 Low back pain: Principal | ICD-10-CM

## 2016-09-10 DIAGNOSIS — G8929 Other chronic pain: Secondary | ICD-10-CM

## 2016-09-10 DIAGNOSIS — M6281 Muscle weakness (generalized): Secondary | ICD-10-CM

## 2016-09-10 NOTE — Therapy (Signed)
[facility] [address] [place], [place], [address] Phone: [phone]   Fax:  [phone]  Physical Therapy Treatment  Patient Details  Name: Tammy Mayo MRN: [id number] Date of Birth: May 31, 1966 No Data Recorded  Encounter Date: 09/10/2016      PT End of Session - 09/10/16 0816    Visit Number 11   Number of Visits 16   Date for PT Re-Evaluation 09/30/16   PT Start Time 0815   PT Stop Time 0919   PT Time Calculation (min) 64 min   Activity Tolerance Patient tolerated treatment well   Behavior During Therapy [facility] for tasks assessed/performed      Past Medical History:  Diagnosis Date  . Anemia   . Arthritis   . Chronic back pain    lumbar 4-5 slip and radicular right leg pain  . Diabetes mellitus without complication (HCC)   . GERD (gastroesophageal reflux disease)   . Hyperlipidemia   . Hypertension   . Seasonal allergies   . Vitamin D deficiency   . Wears glasses     Past Surgical History:  Procedure Laterality Date  . BACK SURGERY    . CESAREAN SECTION     x 1  . CHOLECYSTECTOMY N/A 11/13/2015   Procedure: LAPAROSCOPIC CHOLECYSTECTOMY;  Surgeon: Emelia LoronMatthew Wakefield, MD;  [facility];  Service: General;  Laterality: N/A;  . DILATION AND CURETTAGE OF UTERUS    . HERNIA REPAIR     umbilical hernia repair as a child  . TUBAL LIGATION      There were no vitals filed for this visit.      Subjective Assessment - 09/10/16 0817    Subjective Patient continues to have pain in R LB and buttocks. She reports compliance with putting greater weight through RLE.   Limitations Sitting;Standing   How long can you sit comfortably? 20-30 minutes   How long can you stand comfortably? 10 minutes   How long can you walk comfortably? Short distances.   Patient Stated Goals Decrease pain.   Currently in Pain? Yes   Pain Score 6    Pain Location Back   Pain Orientation Right;Lower                          [facility] Adult PT Treatment/Exercise - 09/10/16 0001      Lumbar Exercises: Supine   Bridge 10 reps;5 seconds     Lumbar Exercises: Prone   Straight Leg Raise 5 reps   Straight Leg Raises Limitations difficult   Other Prone Lumbar Exercises pelvic press series     Modalities   Modalities Electrical Stimulation;Moist Heat     Moist Heat Therapy   Number Minutes Moist Heat 15 Minutes   Moist Heat Location Lumbar Spine;Hip     Electrical Stimulation   Electrical Stimulation Location B Lumbar and glut   Electrical Stimulation Goals Pain     Manual Therapy   Manual Therapy Soft tissue mobilization;Myofascial release   Soft tissue mobilization to B paraspinals and R gluteals   Myofascial Release to B paraspinals and R gluteals          Trigger Point Dry Needling - 09/10/16 1056    Consent Given? Yes   Education Handout Provided No   Muscles Treated Upper Body Quadratus Lumborum;Longissimus  B   Muscles Treated Lower Body Gluteus minimus;Gluteus maximus  R   Longissimus Response Twitch response elicited;Palpable increased muscle length   Gluteus  Maximus Response Twitch response elicited;Palpable increased muscle length   Gluteus Minimus Response Twitch response elicited;Palpable increased muscle length              PT Education - 09/10/16 1238    Education provided Yes   Education Details HEP   Person(s) Educated Patient   Methods Explanation;Demonstration;Handout   Comprehension Verbalized understanding;Returned demonstration             PT Long Term Goals - 09/09/16 1425      PT LONG TERM GOAL #1   Title Ind with a HEP.   Time 8   Period Weeks   Status Achieved     PT LONG TERM GOAL #2   Title Sit 30 minutes with pain not > 3/10.   Time 8   Period Weeks   Status On-going  pain up to 7/10 09/09/16     PT LONG TERM GOAL #3   Title Stand 30 minutes with pain not > 3/10.   Time 8   Period Weeks   Status  On-going  unable due to increased pain 09/09/16     PT LONG TERM GOAL #4   Title Sleep 6 hours undisturbed.   Time 8   Period Weeks   Status On-going  5 hours at most 09/09/16     PT LONG TERM GOAL #5   Title Perform ADL's with pain not > 3-4/10.   Time 8   Period Weeks   Status On-going  8/10 with ADL's 09/09/16     PT LONG TERM GOAL #6   Title Bilateral hip flexion= 115 degrees.   Time 8   Period Weeks   Status On-going               Plan - 09/10/16 1140    Clinical Impression Statement Patient reports some improvement overall since initial DN session. She continues to stand favoring RLE however she reports compliance with weightshifting and SLS at countertop. She had a good response to DN today with significant decrease in TPs present in gluteals. She tolerated TE well but has signifiicnat weakness with hip extension.   PT Treatment/Interventions ADLs/Self Care Home Management;Electrical Stimulation;Moist Heat;Therapeutic exercise;Therapeutic activities;Ultrasound;Patient/family education;Manual techniques;Dry needling   PT Next Visit Plan cont with dry needling prn and Work on gaining strength and confidence in RLE strength with functional exercises; Progress with core strengthening exercises and modalities PRN for pain per MPT      Patient will benefit from skilled therapeutic intervention in order to improve the following deficits and impairments:  Pain, Decreased activity tolerance, Decreased range of motion, Decreased strength  Visit Diagnosis: Chronic right-sided low back pain, with sciatica presence unspecified  Muscle weakness (generalized)     Problem List Patient Active Problem List   Diagnosis Date Noted  . Back pain 06/21/2015  . HTN (hypertension) 10/23/2010  . Vitamin D deficiency 10/23/2010  . Hyperlipemia 10/23/2010  . Plantar fasciitis 10/23/2010    Solon Palm PT 09/10/2016, 12:41 PM  [facility] [address] [place], [place], [address] Phone: [phone]   Fax:  [phone]  Name: Tammy Mayo MRN: [id number] Date of Birth: Feb 19, 1966

## 2016-09-10 NOTE — Patient Instructions (Signed)
Pelvic Press  [id number]   Place hands under belly between navel and pubic bone, palms up. Feel pressure on hands. Increase pressure on hands by pressing pelvis down. This is NOT a pelvic tilt. Hold __5_ seconds. Relax. Repeat _10__ times. Once a day.  KNEE: Flexion - Prone   Hold pelvic press. Bend knee. Raise heel toward buttocks. Repeat on opposite leg. Do not raise hips. _10__ reps per set. When this is mastered, pull both heels up at same time, x 10 reps.  Once a day   Hip Extension (Prone)  Hold pelvic press  Lift left leg _3___ inches from floor, keeping knee locked. Repeat __10__ times per set. Do _1___ sets per session. Do _10___ sessions per day.   Solon PalmJulie Sarenity Ramaker, PT 09/10/16 9:00 AM [facility] [address] [place], [place], [address] Phone: [phone]   Fax:  [phone]

## 2016-09-16 ENCOUNTER — Ambulatory Visit: Payer: BLUE CROSS/BLUE SHIELD | Admitting: Physical Therapy

## 2016-09-16 DIAGNOSIS — M545 Low back pain: Secondary | ICD-10-CM | POA: Diagnosis not present

## 2016-09-16 DIAGNOSIS — M6281 Muscle weakness (generalized): Secondary | ICD-10-CM

## 2016-09-16 DIAGNOSIS — G8929 Other chronic pain: Secondary | ICD-10-CM

## 2016-09-16 NOTE — Therapy (Signed)
[facility] [address] [place], [place], [address] Phone: [phone]   Fax:  [phone]  Physical Therapy Treatment  Patient Details  Name: Tammy Mayo MRN: [id number] Date of Birth: [birth year] No Data Recorded  Encounter Date: 09/16/2016      PT End of Session - 09/16/16 0904    Visit Number 12   Number of Visits 16   Date for PT Re-Evaluation 09/30/16   PT Start Time 0900   PT Stop Time 1001   PT Time Calculation (min) 61 min   Activity Tolerance Patient tolerated treatment well   Behavior [facility] Therapy [facility] for tasks assessed/performed      Past Medical History:  Diagnosis Date  . Anemia   . Arthritis   . Chronic back pain    lumbar 4-5 slip and radicular right leg pain  . Diabetes mellitus without complication (HCC)   . GERD (gastroesophageal reflux disease)   . Hyperlipidemia   . Hypertension   . Seasonal allergies   . Vitamin D deficiency   . Wears glasses     Past Surgical History:  Procedure Laterality Date  . BACK SURGERY    . CESAREAN SECTION     x 1  . CHOLECYSTECTOMY N/A 11/13/2015   Procedure: LAPAROSCOPIC CHOLECYSTECTOMY;  Surgeon: Emelia LoronMatthew Wakefield, MD;  [facility];  Service: General;  Laterality: N/A;  . DILATION AND CURETTAGE OF UTERUS    . HERNIA REPAIR     umbilical hernia repair as a child  . TUBAL LIGATION      There were no vitals filed for this visit.      Subjective Assessment - 09/16/16 0904    Subjective Overall patient feels she is doiing a little better. She had a cramp in her R calf on Saturday.   Patient Stated Goals Decrease pain.   Currently in Pain? Yes   Pain Score 5    Pain Location Back   Pain Orientation Right;Lower   Pain Type Acute pain   Pain Onset More than a month ago   Pain Frequency Constant   Aggravating Factors  prolonged activity   Pain Relieving Factors rest                         [facility] Adult PT Treatment/Exercise -  09/16/16 0001      Exercises   Exercises Knee/Hip     Lumbar Exercises: Aerobic   Stationary Bike Nustep L5 x 15 minutes     Lumbar Exercises: Standing   Row Strengthening;Right;10 reps;20 reps  standing on RLE only   Row Limitations pink xts   Shoulder Extension Strengthening;Both;10 reps;20 reps;Theraband  in minisquat position   Shoulder Extension Limitations pink xts     Lumbar Exercises: Seated   Sit to Stand 20 reps   Sit to Stand Limitations VCs to keep weight equal     Knee/Hip Exercises: Standing   Hip Flexion Stengthening;Both;5 reps   Hip Flexion Limitations difficult; moved to sitting   Forward Step Up Right;3 sets;10 reps;Hand Hold: 1;Step Height: 6"   Forward Step Up Limitations VCs for R hip flexion and keeping foot straight     Knee/Hip Exercises: Seated   Marching Strengthening;Both;10 reps     Modalities   Modalities Electrical Stimulation;Moist Heat     Moist Heat Therapy   Number Minutes Moist Heat 15 Minutes   Moist Heat Location Lumbar Spine;Hip     Insurance claims handlerlectrical Stimulation   Electrical  Stimulation Location R Lumbar and glut premod 80-150hz  x 15 min   Electrical Stimulation Goals Pain                     PT Long Term Goals - 09/16/16 0906      PT LONG TERM GOAL #1   Title Ind with a HEP.   Time 8   Period Weeks   Status Achieved     PT LONG TERM GOAL #2   Title Sit 30 minutes with pain not > 3/10.   Baseline gets up to 6/10 (09/16/16)   Time 8   Period Weeks   Status On-going     PT LONG TERM GOAL #3   Title Stand 30 minutes with pain not > 3/10.   Baseline gets up to 6/10 (09/16/16)   Time 8   Period Weeks   Status On-going     PT LONG TERM GOAL #4   Title Sleep 6 hours undisturbed.   Baseline 4.5-5 hours (09/16/16)   Time 8   Period Weeks   Status On-going     PT LONG TERM GOAL #5   Title Perform ADL's with pain not > 3-4/10.   Baseline 7.5/10 (09/16/16)   Time 8   Period Weeks   Status On-going     PT LONG  TERM GOAL #6   Title Bilateral hip flexion= 115 degrees.   Time 8   Period Weeks   Status On-going               Plan - 09/16/16 1211    Clinical Impression Statement Patient did very well with strengthening. She still lacks confidence in her RLE, but with VCs she is able to perform TE with proper form and decent strength. Much of her deviations are due to habits. She still has significant functional weakness with hip flexion B.   Rehab Potential Good   Clinical Impairments Affecting Rehab Potential 12 weeks post-op 09/13/2015   PT Frequency 2x / week   PT Duration 8 weeks   PT Treatment/Interventions ADLs/Self Care Home Management;Electrical Stimulation;Moist Heat;Therapeutic exercise;Therapeutic activities;Ultrasound;Patient/family education;Manual techniques;Dry needling   PT Next Visit Plan cont gaining strength and confidence in RLE strength with functional exercises; Progress with core strengthening exercises and modalities PRN for pain per MPT; DN prn.   PT Home Exercise Plan SDLY QL stretch; SLS and weight shifting at counter; SKTC, Piriformis figure 4 stretch, ab set, SLR   Consulted and Agree with Plan of Care Patient      Patient will benefit from skilled therapeutic intervention in order to improve the following deficits and impairments:  Pain, Decreased activity tolerance, Decreased range of motion, Decreased strength  Visit Diagnosis: Muscle weakness (generalized)  Chronic right-sided low back pain, with sciatica presence unspecified     Problem List Patient Active Problem List   Diagnosis Date Noted  . Back pain 06/21/2015  . HTN (hypertension) 10/23/2010  . Vitamin D deficiency 10/23/2010  . Hyperlipemia 10/23/2010  . Plantar fasciitis 10/23/2010   Solon Palm PT 09/16/2016, 12:18 PM  [facility] [address] [place], [place], [address] Phone: [phone]   Fax:  [phone]  Name: Tammy Mayo MRN: [id number] Date of Birth: 1966-06-13

## 2016-09-17 ENCOUNTER — Ambulatory Visit: Payer: BLUE CROSS/BLUE SHIELD | Admitting: *Deleted

## 2016-09-17 DIAGNOSIS — M6281 Muscle weakness (generalized): Secondary | ICD-10-CM

## 2016-09-17 DIAGNOSIS — M545 Low back pain: Secondary | ICD-10-CM | POA: Diagnosis not present

## 2016-09-17 DIAGNOSIS — G8929 Other chronic pain: Secondary | ICD-10-CM

## 2016-09-17 NOTE — Therapy (Signed)
[facility] [address] [place], [place], [address] Phone: [phone]   Fax:  [phone]  Physical Therapy Treatment  Patient Details  Name: Tammy Mayo MRN: [id number] Date of Birth: March 04, 1966 No Data Recorded  Encounter Date: 09/17/2016      PT End of Session - 09/17/16 6962    Visit Number 13   Number of Visits 16   Date for PT Re-Evaluation 09/30/16   PT Start Time 0815   PT Stop Time 0915   PT Time Calculation (min) 60 min      Past Medical History:  Diagnosis Date  . Anemia   . Arthritis   . Chronic back pain    lumbar 4-5 slip and radicular right leg pain  . Diabetes mellitus without complication (HCC)   . GERD (gastroesophageal reflux disease)   . Hyperlipidemia   . Hypertension   . Seasonal allergies   . Vitamin D deficiency   . Wears glasses     Past Surgical History:  Procedure Laterality Date  . BACK SURGERY    . CESAREAN SECTION     x 1  . CHOLECYSTECTOMY N/A 11/13/2015   Procedure: LAPAROSCOPIC CHOLECYSTECTOMY;  Surgeon: Emelia Loron, MD;  Location: [facility] OR;  Service: General;  Laterality: N/A;  . DILATION AND CURETTAGE OF UTERUS    . HERNIA REPAIR     umbilical hernia repair as a child  . TUBAL LIGATION      There were no vitals filed for this visit.      Subjective Assessment - 09/17/16 0819    Subjective Doing ok, but my Lt calf is cramping some again. LBP tightness and Glute   Limitations Sitting;Standing   How long can you sit comfortably? 20-30 minutes   How long can you stand comfortably? 10 minutes   How long can you walk comfortably? Short distances.   Patient Stated Goals Decrease pain.   Currently in Pain? Yes   Pain Score 6    Pain Location Back   Pain Orientation Right   Pain Frequency Constant                         [facility] Adult PT Treatment/Exercise - 09/17/16 0001      Exercises   Exercises Knee/Hip     Lumbar Exercises: Aerobic   Stationary Bike Nustep L5 x 15 minutes     Lumbar Exercises: Standing   Row Strengthening;Right;10 reps;20 reps  standing on RLE only, and the LT   Row Limitations pink XTS   Shoulder Extension Strengthening;Both;10 reps;20 reps;Theraband  in minisquat position   Shoulder Extension Limitations pink xts     Lumbar Exercises: Seated   Sit to Stand 20 reps   Sit to Stand Limitations VCs to keep weight equal     Knee/Hip Exercises: Standing   Hip Flexion Stengthening;Both;5 reps   Hip Flexion Limitations difficult; moved to sitting   Forward Step Up Right;3 sets;10 reps;Hand Hold: 1;Step Height: 6"   Forward Step Up Limitations VCs for R hip flexion and keeping foot straight   Rocker Board 3 minutes  calf stretching     Modalities   Modalities Electrical Stimulation;Moist Heat     Moist Heat Therapy   Number Minutes Moist Heat 15 Minutes   Moist Heat Location Lumbar Spine;Hip     Electrical Stimulation   Electrical Stimulation Location R Lumbar and glut premod 80-150hz  x 15 min   Electrical Stimulation Goals Pain  PT Long Term Goals - 09/16/16 0906      PT LONG TERM GOAL #1   Title Ind with a HEP.   Time 8   Period Weeks   Status Achieved     PT LONG TERM GOAL #2   Title Sit 30 minutes with pain not > 3/10.   Baseline gets up to 6/10 (09/16/16)   Time 8   Period Weeks   Status On-going     PT LONG TERM GOAL #3   Title Stand 30 minutes with pain not > 3/10.   Baseline gets up to 6/10 (09/16/16)   Time 8   Period Weeks   Status On-going     PT LONG TERM GOAL #4   Title Sleep 6 hours undisturbed.   Baseline 4.5-5 hours (09/16/16)   Time 8   Period Weeks   Status On-going     PT LONG TERM GOAL #5   Title Perform ADL's with pain not > 3-4/10.   Baseline 7.5/10 (09/16/16)   Time 8   Period Weeks   Status On-going     PT LONG TERM GOAL #6   Title Bilateral hip flexion= 115 degrees.   Time 8   Period Weeks   Status On-going                Plan - 09/17/16 [id number]    Clinical Impression Statement Pt arrives today in good spirits with some complaints of LB and glute pain on RT side. She also reports of her RT calf starting to cramp again like it did before surgery. Rx focused on building Rt LE confidence and core strengthening.. Pt did fairly well today and hAd normal response to modalities   Rehab Potential Good   PT Frequency 2x / week   PT Duration 8 weeks   PT Treatment/Interventions ADLs/Self Care Home Management;Electrical Stimulation;Moist Heat;Therapeutic exercise;Therapeutic activities;Ultrasound;Patient/family education;Manual techniques;Dry needling   PT Next Visit Plan cont gaining strength and confidence in RLE strength with functional exercises; Progress with core strengthening exercises and modalities PRN for pain per MPT; DN prn.   PT Home Exercise Plan SDLY QL stretch; SLS and weight shifting at counter; SKTC, Piriformis figure 4 stretch, ab set, SLR   Consulted and Agree with Plan of Care Patient      Patient will benefit from skilled therapeutic intervention in order to improve the following deficits and impairments:  Pain, Decreased activity tolerance, Decreased range of motion, Decreased strength  Visit Diagnosis: Muscle weakness (generalized)  Chronic right-sided low back pain, with sciatica presence unspecified     Problem List Patient Active Problem List   Diagnosis Date Noted  . Back pain 06/21/2015  . HTN (hypertension) 10/23/2010  . Vitamin D deficiency 10/23/2010  . Hyperlipemia 10/23/2010  . Plantar fasciitis 10/23/2010    RAMSEUR,CHRIS, PTA 09/17/2016, 9:16 AM  [facility] [address] [place], [place], [address] Phone: [phone]   Fax:  [phone]  Name: Tammy Mayo MRN: [id number] Date of Birth: 1965/10/30

## 2016-09-23 ENCOUNTER — Ambulatory Visit: Payer: BLUE CROSS/BLUE SHIELD | Admitting: Physical Therapy

## 2016-09-23 ENCOUNTER — Encounter: Payer: Self-pay | Admitting: Physical Therapy

## 2016-09-23 DIAGNOSIS — M6281 Muscle weakness (generalized): Secondary | ICD-10-CM

## 2016-09-23 DIAGNOSIS — M545 Low back pain: Secondary | ICD-10-CM

## 2016-09-23 DIAGNOSIS — G8929 Other chronic pain: Secondary | ICD-10-CM

## 2016-09-23 NOTE — Therapy (Signed)
[facility] [address] [place], [place], [address] Phone: [phone]   Fax:  [phone]  Physical Therapy Treatment  Patient Details  Name: Tammy Mayo MRN: [id number] Date of Birth: 12-06-1965 No Data Recorded  Encounter Date: 09/23/2016      PT End of Session - 09/23/16 1118    Visit Number 14   Number of Visits 16   Date for PT Re-Evaluation 09/30/16   PT Start Time 1115   PT Stop Time 1213   PT Time Calculation (min) 58 min   Activity Tolerance Patient tolerated treatment well   Behavior During Therapy [facility] for tasks assessed/performed      Past Medical History:  Diagnosis Date  . Anemia   . Arthritis   . Chronic back pain    lumbar 4-5 slip and radicular right leg pain  . Diabetes mellitus without complication (HCC)   . GERD (gastroesophageal reflux disease)   . Hyperlipidemia   . Hypertension   . Seasonal allergies   . Vitamin D deficiency   . Wears glasses     Past Surgical History:  Procedure Laterality Date  . BACK SURGERY    . CESAREAN SECTION     x 1  . CHOLECYSTECTOMY N/A 11/13/2015   Procedure: LAPAROSCOPIC CHOLECYSTECTOMY;  Surgeon: Emelia Loron, MD;  [facility];  Service: General;  Laterality: N/A;  . DILATION AND CURETTAGE OF UTERUS    . HERNIA REPAIR     umbilical hernia repair as a child  . TUBAL LIGATION      There were no vitals filed for this visit.      Subjective Assessment - 09/23/16 1116    Subjective Reports that she is still having pain but she is not going to complain and to continue exercises as she is told. R calf not cramping today.   Limitations Sitting;Standing   How long can you sit comfortably? 20-30 minutes   How long can you stand comfortably? 10 minutes   How long can you walk comfortably? Short distances.   Patient Stated Goals Decrease pain.   Currently in Pain? Yes   Pain Score 6    Pain Location Back   Pain Orientation Right   Pain Descriptors  / Indicators Aching   Pain Type Acute pain   Pain Onset More than a month ago   Pain Frequency Constant            [facility] PT Assessment - 09/23/16 0001      Assessment   Medical Diagnosis DDD Lumbar with right sciatica.   Onset Date/Surgical Date 06/20/16   Next MD Visit None scheduled     Precautions   Precautions Back     Restrictions   Weight Bearing Restrictions No                     [facility] Adult PT Treatment/Exercise - 09/23/16 0001      Lumbar Exercises: Aerobic   Stationary Bike Nustep L5 x 15 minutes  with VCs for core activation during NuStep     Lumbar Exercises: Physiological scientist;Both   Row Limitations Pink XTS x30 reps   Shoulder Extension Strengthening;Both   Shoulder Extension Limitations Pink XTS x30 reps   Other Standing Lumbar Exercises B shoulder punches with core activation x10 reps     Lumbar Exercises: Seated   Sit to Stand 20 reps     Knee/Hip Exercises: Standing   Hip Flexion Stengthening;Both;2 sets;10  reps;Knee bent   Hip Abduction AROM;Both;1 set;15 reps;Knee straight   Forward Step Up Both;3 sets;10 reps;Hand Hold: 1;Step Height: 6"     Modalities   Modalities Electrical Stimulation;Moist Heat     Moist Heat Therapy   Number Minutes Moist Heat 15 Minutes   Moist Heat Location Lumbar Spine     Electrical Stimulation   Electrical Stimulation Location B lumbar paraspinals   Electrical Stimulation Action IFC   Electrical Stimulation Parameters 80-150 hz x15 min   Electrical Stimulation Goals Pain                     PT Long Term Goals - 09/16/16 0906      PT LONG TERM GOAL #1   Title Ind with a HEP.   Time 8   Period Weeks   Status Achieved     PT LONG TERM GOAL #2   Title Sit 30 minutes with pain not > 3/10.   Baseline gets up to 6/10 (09/16/16)   Time 8   Period Weeks   Status On-going     PT LONG TERM GOAL #3   Title Stand 30 minutes with pain not > 3/10.   Baseline gets up to  6/10 (09/16/16)   Time 8   Period Weeks   Status On-going     PT LONG TERM GOAL #4   Title Sleep 6 hours undisturbed.   Baseline 4.5-5 hours (09/16/16)   Time 8   Period Weeks   Status On-going     PT LONG TERM GOAL #5   Title Perform ADL's with pain not > 3-4/10.   Baseline 7.5/10 (09/16/16)   Time 8   Period Weeks   Status On-going     PT LONG TERM GOAL #6   Title Bilateral hip flexion= 115 degrees.   Time 8   Period Weeks   Status On-going               Plan - 09/23/16 1210    Clinical Impression Statement Patient arrived to treatment today with continued 6/10 LBP today. Patient still able to continue with exercises with focus on equal weightbearing and core activation with all exercises. Patient requires mod cueing for forward step ups for proper sequencing. Only minimal lack of full weightbearing through RLE with sit to stands today. Normal modalities response noted following removal of the modalities. Patient's pain level is higher than goal status, lack of hip flexion as well as sleep deficits are limiting goal achievements.   Rehab Potential Good   Clinical Impairments Affecting Rehab Potential 12 weeks post-op 09/13/2015   PT Frequency 2x / week   PT Duration 8 weeks   PT Treatment/Interventions ADLs/Self Care Home Management;Electrical Stimulation;Moist Heat;Therapeutic exercise;Therapeutic activities;Ultrasound;Patient/family education;Manual techniques;Dry needling   PT Next Visit Plan cont gaining strength and confidence in RLE strength with functional exercises; Progress with core strengthening exercises and modalities PRN for pain per MPT; DN prn.   PT Home Exercise Plan SDLY QL stretch; SLS and weight shifting at counter; SKTC, Piriformis figure 4 stretch, ab set, SLR   Consulted and Agree with Plan of Care Patient      Patient will benefit from skilled therapeutic intervention in order to improve the following deficits and impairments:  Pain, Decreased  activity tolerance, Decreased range of motion, Decreased strength  Visit Diagnosis: Muscle weakness (generalized)  Chronic right-sided low back pain, with sciatica presence unspecified     Problem List Patient Active Problem List  Diagnosis Date Noted  . Back pain 06/21/2015  . HTN (hypertension) 10/23/2010  . Vitamin D deficiency 10/23/2010  . Hyperlipemia 10/23/2010  . Plantar fasciitis 10/23/2010    Evelene Croon, PTA 09/23/2016, 12:17 PM  [facility] [address] [place], [place], [address] Phone: [phone]   Fax:  [phone]  Name: MANE CONSOLO MRN: [id number] Date of Birth: Jul 29, 1966

## 2016-09-25 ENCOUNTER — Ambulatory Visit: Payer: BLUE CROSS/BLUE SHIELD | Admitting: Physical Therapy

## 2016-09-25 DIAGNOSIS — M545 Low back pain: Secondary | ICD-10-CM | POA: Diagnosis not present

## 2016-09-25 NOTE — Therapy (Signed)
[facility] [address] [place], [place], [address] Phone: [phone]   Fax:  [phone]  Physical Therapy Treatment  Patient Details  Name: Tammy Mayo MRN: [id number] Date of Birth: 1966/04/12 No Data Recorded  Encounter Date: 09/25/2016      PT End of Session - 09/25/16 1705    Visit Number 16   Number of Visits 24   Date for PT Re-Evaluation 10/28/16   PT Start Time 0145   PT Stop Time 0233   PT Time Calculation (min) 48 min   Activity Tolerance Patient tolerated treatment well   Behavior During Therapy [facility] for tasks assessed/performed      Past Medical History:  Diagnosis Date  . Anemia   . Arthritis   . Chronic back pain    lumbar 4-5 slip and radicular right leg pain  . Diabetes mellitus without complication (HCC)   . GERD (gastroesophageal reflux disease)   . Hyperlipidemia   . Hypertension   . Seasonal allergies   . Vitamin D deficiency   . Wears glasses     Past Surgical History:  Procedure Laterality Date  . BACK SURGERY    . CESAREAN SECTION     x 1  . CHOLECYSTECTOMY N/A 11/13/2015   Procedure: LAPAROSCOPIC CHOLECYSTECTOMY;  Surgeon: Emelia Loron, MD;  Location: [facility] OR;  Service: General;  Laterality: N/A;  . DILATION AND CURETTAGE OF UTERUS    . HERNIA REPAIR     umbilical hernia repair as a child  . TUBAL LIGATION      There were no vitals filed for this visit.      Subjective Assessment - 09/25/16 1705    Subjective My pain is around a 6/10.  I going to do one more visit and save some visits.   Pain Score 6    Pain Location Back   Pain Orientation Right   Pain Descriptors / Indicators Aching   Pain Type Acute pain   Pain Onset More than a month ago                         [facility] Adult PT Treatment/Exercise - 09/25/16 0001      Exercises   Exercises Knee/Hip     Lumbar Exercises: Aerobic   Stationary Bike Nustep level 5 x 15 minutes.     Lumbar  Exercises: Standing   Other Standing Lumbar Exercises Orange XTS exercises:  Scapular retraction; shoulder extension; forward punches and pull-downs (8 minutes total).     Modalities   Modalities Electrical Stimulation;Moist Heat     Moist Heat Therapy   Number Minutes Moist Heat 15 Minutes   Moist Heat Location Lumbar Spine     Electrical Stimulation   Electrical Stimulation Location --  RT LB.   Electrical Stimulation Action --  PRE-MOD   Electrical Stimulation Parameters 80-150 Hz x 15 minutes.   Electrical Stimulation Goals Pain                     PT Long Term Goals - 09/16/16 0906      PT LONG TERM GOAL #1   Title Ind with a HEP.   Time 8   Period Weeks   Status Achieved     PT LONG TERM GOAL #2   Title Sit 30 minutes with pain not > 3/10.   Baseline gets up to 6/10 (09/16/16)   Time 8   Period Weeks  Status On-going     PT LONG TERM GOAL #3   Title Stand 30 minutes with pain not > 3/10.   Baseline gets up to 6/10 (09/16/16)   Time 8   Period Weeks   Status On-going     PT LONG TERM GOAL #4   Title Sleep 6 hours undisturbed.   Baseline 4.5-5 hours (09/16/16)   Time 8   Period Weeks   Status On-going     PT LONG TERM GOAL #5   Title Perform ADL's with pain not > 3-4/10.   Baseline 7.5/10 (09/16/16)   Time 8   Period Weeks   Status On-going     PT LONG TERM GOAL #6   Title Bilateral hip flexion= 115 degrees.   Time 8   Period Weeks   Status On-going             Patient will benefit from skilled therapeutic intervention in order to improve the following deficits and impairments:     Visit Diagnosis: Low back pain, unspecified back pain laterality, unspecified chronicity, with sciatica presence unspecified - Plan: PT plan of care cert/re-cert     Problem List Patient Active Problem List   Diagnosis Date Noted  . Back pain 06/21/2015  . HTN (hypertension) 10/23/2010  . Vitamin D deficiency 10/23/2010  . Hyperlipemia  10/23/2010  . Plantar fasciitis 10/23/2010    Marchele Decock, [place] 09/25/2016, 5:10 PM  [facility] [address] [place], [place], [address] Phone: [phone]   Fax:  [phone]  Name: Hassan Bucklerlizabeth A Duplantis MRN: [id number] Date of Birth: September 01, 1965

## 2016-10-02 ENCOUNTER — Encounter: Payer: Self-pay | Admitting: Physical Therapy

## 2016-10-02 ENCOUNTER — Ambulatory Visit: Payer: BLUE CROSS/BLUE SHIELD | Attending: *Deleted | Admitting: Physical Therapy

## 2016-10-02 DIAGNOSIS — G8929 Other chronic pain: Secondary | ICD-10-CM | POA: Insufficient documentation

## 2016-10-02 DIAGNOSIS — M6281 Muscle weakness (generalized): Secondary | ICD-10-CM | POA: Insufficient documentation

## 2016-10-02 DIAGNOSIS — M545 Low back pain: Secondary | ICD-10-CM | POA: Diagnosis present

## 2016-10-02 NOTE — Therapy (Signed)
[facility] [place], [place], [address] Phone: [phone]   Fax:  [phone]  Physical Therapy Treatment  Patient Details  Name: Tammy Mayo MRN: [id number] Date of Birth: [birth year] No Data Recorded  Encounter Date: 10/02/2016      PT End of Session - 10/02/16 1434    Visit Number 17   Number of Visits 24   Date for PT Re-Evaluation 10/28/16   PT Start Time 0175   PT Stop Time 1443   PT Time Calculation (min) 46 min   Activity Tolerance Patient tolerated treatment well   Behavior During Therapy [facility] for tasks assessed/performed      Past Medical History:  Diagnosis Date  . Anemia   . Arthritis   . Chronic back pain    lumbar 4-5 slip and radicular right leg pain  . Diabetes mellitus without complication ([place])   . GERD (gastroesophageal reflux disease)   . Hyperlipidemia   . Hypertension   . Seasonal allergies   . Vitamin D deficiency   . Wears glasses     Past Surgical History:  Procedure Laterality Date  . BACK SURGERY    . CESAREAN SECTION     x 1  . CHOLECYSTECTOMY N/A 11/13/2015   Procedure: LAPAROSCOPIC CHOLECYSTECTOMY;  Surgeon: Rolm Bookbinder, MD;  Location: [facility];  Service: General;  Laterality: N/A;  . DILATION AND CURETTAGE OF UTERUS    . HERNIA REPAIR     umbilical hernia repair as a child  . TUBAL LIGATION      There were no vitals filed for this visit.      Subjective Assessment - 10/02/16 1359    Subjective Today is last day per patient feeling better overall   Limitations Sitting;Standing   How long can you sit comfortably? 20-30 minutes   How long can you stand comfortably? 10 minutes   How long can you walk comfortably? Short distances.   Patient Stated Goals Decrease pain.   Currently in Pain? Yes   Pain Score 6    Pain Location Back   Pain Orientation Right   Pain Descriptors / Indicators Aching   Pain Type Acute pain   Pain Onset More than a month ago   Pain  Frequency Constant   Aggravating Factors  prolong activity   Pain Relieving Factors at rest            [facility] PT Assessment - 10/02/16 0001      AROM   AROM Assessment Site Hip   Right/Left Hip Right;Left   Right Hip Flexion 110   Left Hip Flexion 115                     [facility] Adult PT Treatment/Exercise - 10/02/16 0001      Lumbar Exercises: Aerobic   Stationary Bike Nustep level 5 x 15 minutes posure focus     Lumbar Exercises: Standing   Row Strengthening;Both;20 reps  pink XTS   Shoulder Extension Strengthening;Both;20 reps  pink XTS     Lumbar Exercises: Supine   Bridge 20 reps     Knee/Hip Exercises: Standing   Hip Abduction AROM;Both;Knee straight;2 sets;10 reps   Forward Step Up Both;3 sets;10 reps;Hand Hold: 1;Step Height: 6"     Moist Heat Therapy   Number Minutes Moist Heat 15 Minutes   Moist Heat Location Lumbar Spine     Electrical Stimulation   Electrical Stimulation Location B lumbar paraspinals   Electrical  Stimulation Action IFC   Electrical Stimulation Parameters 80-_0    Electrical Stimulation Goals Pain                     PT Long Term Goals - 10/02/16 1411      PT LONG TERM GOAL #1   Title Ind with a HEP.   Time 8   Period Weeks   Status Achieved     PT LONG TERM GOAL #2   Title Sit 30 minutes with pain not > 3/10.   Baseline gets up to 6/10 (09/16/16)   Time 8   Period Weeks   Status Not Met  over 3/10 10/02/16     PT LONG TERM GOAL #3   Title Stand 30 minutes with pain not > 3/10.   Baseline gets up to 6/10 (09/16/16)   Time 8   Period Weeks   Status Not Met  15-20 min only 10/02/16     PT LONG TERM GOAL #4   Title Sleep 6 hours undisturbed.   Baseline 4.5-5 hours (09/16/16)   Time 8   Period Weeks   Status Not Met  4-5 hours 10/02/16     PT LONG TERM GOAL #5   Title Perform ADL's with pain not > 3-4/10.   Baseline 7.5/10 (09/16/16)   Time 8   Period Weeks   Status Not Met  9/10 10/02/16      PT LONG TERM GOAL #6   Title Bilateral hip flexion= 115 degrees.   Time 8   Period Weeks   Status Partially Met               Plan - 10/02/16 1431    Clinical Impression Statement Patient would like today to be last day due to financial reasons. Patient unable to meet all remaining goals due to pain deficts. Patient independent with HEP and understands importance of posture and exercise to tolerance and rest when needed.    Rehab Potential Good   Clinical Impairments Affecting Rehab Potential 12 weeks post-op 09/13/2015   PT Frequency 2x / week   PT Duration 8 weeks   PT Treatment/Interventions ADLs/Self Care Home Management;Electrical Stimulation;Moist Heat;Therapeutic exercise;Therapeutic activities;Ultrasound;Patient/family education;Manual techniques;Dry needling   PT Next Visit Plan DC per patient   Consulted and Agree with Plan of Care Patient      Patient will benefit from skilled therapeutic intervention in order to improve the following deficits and impairments:  Pain, Decreased activity tolerance, Decreased range of motion, Decreased strength  Visit Diagnosis: Low back pain, unspecified back pain laterality, unspecified chronicity, with sciatica presence unspecified  Muscle weakness (generalized)  Chronic right-sided low back pain, with sciatica presence unspecified     Problem List Patient Active Problem List   Diagnosis Date Noted  . Back pain 06/21/2015  . HTN (hypertension) 10/23/2010  . Vitamin D deficiency 10/23/2010  . Hyperlipemia 10/23/2010  . Plantar fasciitis 10/23/2010    Ladean Raya, PTA 10/02/16 2:48 PM  [facility] [address] [place], [place], [address] Phone: [phone]   Fax:  [phone]  Name: Tammy Mayo MRN: [id number] Date of Birth: [birth year]  PHYSICAL THERAPY DISCHARGE SUMMARY  Visits from Start of Care: 17.  Current functional level related to goals /  functional outcomes: See above.   Remaining deficits: Continued low back pain.   Education / Equipment: HEP. Plan: Patient agrees to discharge.  Patient goals were partially met. Patient is being discharged due to financial reasons.  ?????  Mali Applegate MPT

## 2017-07-15 ENCOUNTER — Emergency Department (HOSPITAL_COMMUNITY)
Admission: EM | Admit: 2017-07-15 | Discharge: 2017-07-15 | Disposition: A | Discharge status: Home / Self Care | Payer: BLUE CROSS/BLUE SHIELD | Attending: Emergency Medicine | Admitting: Emergency Medicine

## 2017-07-15 ENCOUNTER — Emergency Department (HOSPITAL_COMMUNITY): Payer: BLUE CROSS/BLUE SHIELD

## 2017-07-15 ENCOUNTER — Encounter (HOSPITAL_COMMUNITY): Payer: Self-pay

## 2017-07-15 DIAGNOSIS — R079 Chest pain, unspecified: Secondary | ICD-10-CM | POA: Insufficient documentation

## 2017-07-15 DIAGNOSIS — I10 Essential (primary) hypertension: Secondary | ICD-10-CM | POA: Insufficient documentation

## 2017-07-15 DIAGNOSIS — E119 Type 2 diabetes mellitus without complications: Secondary | ICD-10-CM | POA: Diagnosis not present

## 2017-07-15 DIAGNOSIS — Z79899 Other long term (current) drug therapy: Secondary | ICD-10-CM | POA: Diagnosis not present

## 2017-07-15 DIAGNOSIS — Z7984 Long term (current) use of oral hypoglycemic drugs: Secondary | ICD-10-CM | POA: Insufficient documentation

## 2017-07-15 LAB — I-STAT TROPONIN, ED
TROPONIN I, POC: 0.01 ng/mL (ref 0.00–0.08)
TROPONIN I, POC: 0.01 ng/mL (ref 0.00–0.08)

## 2017-07-15 LAB — HEPATIC FUNCTION PANEL
ALBUMIN: 3.9 g/dL (ref 3.5–5.0)
ALT: 20 U/L (ref 14–54)
AST: 26 U/L (ref 15–41)
Alkaline Phosphatase: 78 U/L (ref 38–126)
BILIRUBIN TOTAL: 0.7 mg/dL (ref 0.3–1.2)
Bilirubin, Direct: 0.2 mg/dL (ref 0.1–0.5)
Indirect Bilirubin: 0.5 mg/dL (ref 0.3–0.9)
TOTAL PROTEIN: 7.5 g/dL (ref 6.5–8.1)

## 2017-07-15 LAB — BASIC METABOLIC PANEL
ANION GAP: 12 (ref 5–15)
BUN: 8 mg/dL (ref 6–20)
CALCIUM: 9.3 mg/dL (ref 8.9–10.3)
CO2: 26 mmol/L (ref 22–32)
CREATININE: 0.76 mg/dL (ref 0.44–1.00)
Chloride: 101 mmol/L (ref 101–111)
Glucose, Bld: 142 mg/dL — ABNORMAL HIGH (ref 65–99)
Potassium: 3.6 mmol/L (ref 3.5–5.1)
SODIUM: 139 mmol/L (ref 135–145)

## 2017-07-15 LAB — CBC
HCT: 42 % (ref 36.0–46.0)
HEMOGLOBIN: 13.4 g/dL (ref 12.0–15.0)
MCH: 29.1 pg (ref 26.0–34.0)
MCHC: 31.9 g/dL (ref 30.0–36.0)
MCV: 91.3 fL (ref 78.0–100.0)
PLATELETS: 312 10*3/uL (ref 150–400)
RBC: 4.6 MIL/uL (ref 3.87–5.11)
RDW: 12.7 % (ref 11.5–15.5)
WBC: 8.8 10*3/uL (ref 4.0–10.5)

## 2017-07-15 LAB — LIPASE, BLOOD: LIPASE: 21 U/L (ref 11–51)

## 2017-07-15 MED ORDER — GI COCKTAIL ~~LOC~~
30.0000 mL | Freq: Once | ORAL | Status: AC
  Administered 2017-07-15: 30 mL via ORAL
  Filled 2017-07-15: qty 30

## 2017-07-15 MED ORDER — OMEPRAZOLE 20 MG PO CPDR: 20.0000 mg | DELAYED_RELEASE_CAPSULE | Freq: Every day | ORAL | 0 refills | Status: DC

## 2017-07-15 NOTE — ED Notes (Signed)
Patient states she had some epigastric pain with eating and drinking.

## 2017-07-15 NOTE — ED Triage Notes (Signed)
Pt reports chest pain and epigastric pain that started this morning after she got up.  Reports pain is worse when she takes a deep breath.  Reports has been taking otc sinus relief medication prn for the past week.  Denies cough.

## 2017-07-15 NOTE — ED Provider Notes (Signed)
[facility] EMERGENCY DEPARTMENT Provider Note   CSN: [id number] Arrival date & time: 07/15/17  0815     History   Chief Complaint Chief Complaint  Patient presents with  . Chest Pain    HPI Tammy Mayo is a 51 y.o. female.  HPI Patient presents with chest pain.  States she woke with it around 3 this morning.  Is in her lower chest and upper abdomen.  States it is worse with breathing.  No shortness of breath.  No fevers.  No cough.  No nausea or vomiting.  States she did not eat breakfast because she did not feel like having any food.  She has not had pains like this before.  No diaphoresis.  No nausea.  Has had some sinus issues for the last few days and has been on sinus medicine for it. Past Medical History:  Diagnosis Date  . Anemia   . Arthritis   . Chronic back pain    lumbar 4-5 slip and radicular right leg pain  . Diabetes mellitus without complication ([facility])   . GERD (gastroesophageal reflux disease)   . Hyperlipidemia   . Hypertension   . Seasonal allergies   . Vitamin D deficiency   . Wears glasses     Patient Active Problem List   Diagnosis Date Noted  . Back pain 06/21/2015  . HTN (hypertension) 10/23/2010  . Vitamin D deficiency 10/23/2010  . Hyperlipemia 10/23/2010  . Plantar fasciitis 10/23/2010    Past Surgical History:  Procedure Laterality Date  . BACK SURGERY    . CESAREAN SECTION     x 1  . CHOLECYSTECTOMY N/A 11/13/2015   Procedure: LAPAROSCOPIC CHOLECYSTECTOMY;  Surgeon: Emelia LoronMatthew Wakefield, MD;  [facility];  Service: General;  Laterality: N/A;  . DILATION AND CURETTAGE OF UTERUS    . HERNIA REPAIR     umbilical hernia repair as a child  . TUBAL LIGATION      OB History    No data available       Home Medications    Prior to Admission medications   Medication Sig Start Date End Date Taking? Authorizing Provider  ARIPiprazole (ABILIFY) 2 MG tablet Take 1 tablet by mouth daily. 06/26/17  Yes [provider]    Biotin 1000 MCG tablet Take 1,000 mcg by mouth daily.   Yes [provider]  cetirizine (ZYRTEC) 10 MG tablet Take 10 mg by mouth daily.   Yes [provider]  cholecalciferol (VITAMIN D) 1000 UNITS tablet Take 1,000 Units by mouth daily.   Yes [provider]  colesevelam (WELCHOL) 625 MG tablet Take 625 mg by mouth daily.   Yes [provider]  Dapagliflozin-Metformin HCl ER (XIGDUO XR) 04-999 MG TB24 Take 1 tablet by mouth daily with supper.   Yes [provider]  docusate sodium (COLACE) 100 MG capsule Take 100 mg by mouth daily.   Yes [provider]  DULoxetine (CYMBALTA) 30 MG capsule Take 30 mg by mouth 2 (two) times daily.   Yes [provider]  estradiol (ESTRACE) 1 MG tablet Take 1 mg by mouth daily.   Yes [provider]  gabapentin (NEURONTIN) 300 MG capsule Take 300 mg by mouth at bedtime.    Yes [provider]  hydrochlorothiazide (,MICROZIDE/HYDRODIURIL,) 12.5 MG capsule Take 12.5 mg by mouth daily.     Yes [provider]  lisinopril (PRINIVIL,ZESTRIL) 40 MG tablet Take 40 mg by mouth daily.     Yes  [provider]  lubiprostone (AMITIZA) 24 MCG capsule Take 24 mcg by mouth 2 (two) times daily with a meal.   Yes [provider]  Magnesium 250 MG TABS Take 1 tablet by mouth daily.   Yes [provider]  medroxyPROGESTERone (PROVERA) 2.5 MG tablet Take 2.5 mg by mouth daily.   Yes [provider]  neomycin-bacitracin-polymyxin (NEOSPORIN) ointment Apply 1 application topically daily as needed for wound care.    Yes [provider]  ondansetron (ZOFRAN) 4 MG tablet Take 1 tablet (4 mg total) by mouth every 8 (eight) hours as needed for nausea or vomiting. 06/21/15  Yes Venita Lick, MD  oxymetazoline (AFRIN) 0.05 % nasal spray Place 1 spray into both nostrils 2 (two) times daily as needed for congestion (over the counter nasal spray).   Yes  [provider]  polyethylene glycol (MIRALAX / GLYCOLAX) packet Take 17 g by mouth daily.   Yes [provider]  promethazine (PHENERGAN) 25 MG tablet Take 25 mg by mouth 2 (two) times daily.   Yes [provider]  rosuvastatin (CRESTOR) 10 MG tablet Take 10 mg by mouth daily.   Yes [provider]  traMADol (ULTRAM) 50 MG tablet Take 50 mg by mouth every 12 (twelve) hours as needed for moderate pain.    Yes [provider]  UNKNOWN TO PATIENT Inject 1 Dose as directed every 3 (three) months. Patient is unaware of what the medication is, but says her doctor administers a shot every 3 months to help with pain   Yes [provider]  vitamin B-12 (CYANOCOBALAMIN) 500 MCG tablet Take 500 mcg by mouth daily.   Yes [provider]  omeprazole (PRILOSEC) 20 MG capsule Take 1 capsule (20 mg total) by mouth daily. 07/15/17   Benjiman Core, MD    Family History Family History  Problem Relation Age of Onset  . Stroke Mother   . Hypertension Mother   . Diabetes Mother   . Hypertension Father   . Diabetes Father   . Hypertension Sister     Social History Social History   Tobacco Use  . Smoking status: Never Smoker  . Smokeless tobacco: Never Used  Substance Use Topics  . Alcohol use: No  . Drug use: No     Allergies   Patient has no known allergies.   Review of Systems Review of Systems  Constitutional: Negative for appetite change, fatigue and fever.  HENT: Negative for congestion.   Cardiovascular: Positive for chest pain.  Gastrointestinal: Positive for abdominal pain. Negative for abdominal distention.  Genitourinary: Negative for dysuria.  Musculoskeletal: Negative for back pain.  Skin: Negative for rash.  Neurological: Negative for syncope.  Hematological: Negative for adenopathy.  Psychiatric/Behavioral: Negative for confusion.     Physical Exam Updated Vital Signs BP 103/76   Pulse 82   Temp 97.8 F  (36.6 C) (Oral)   Resp 13   Ht 5\' 4"  (1.626 m)   Wt 97.5 kg (215 lb)   LMP  (LMP Unknown)   SpO2 97%   BMI 36.90 kg/m   Physical Exam  Constitutional: She appears well-developed and well-nourished.  HENT:  Head: Atraumatic.  Cardiovascular: Regular rhythm.  Pulmonary/Chest: Effort normal and breath sounds normal.  Abdominal: There is no tenderness.  Musculoskeletal: Normal range of motion.       Right lower leg: She exhibits no edema.       Left lower leg: She exhibits no edema.  Neurological: She  is alert.  Skin: Skin is warm. Capillary refill takes less than 2 seconds.  Psychiatric: She has a normal mood and affect.     ED Treatments / Results  Labs (all labs ordered are listed, but only abnormal results are displayed) Labs Reviewed  BASIC METABOLIC PANEL - Abnormal; Notable for the following components:      Result Value   Glucose, Bld 142 (*)    All other components within normal limits  CBC  HEPATIC FUNCTION PANEL  LIPASE, BLOOD  I-STAT TROPONIN, ED  I-STAT TROPONIN, ED    EKG  EKG Interpretation  Date/Time:  Tuesday July 15 2017 08:21:48 EST Ventricular Rate:  93 PR Interval:    QRS Duration: 57 QT Interval:  432 QTC Calculation: 538 R Axis:   65 Text Interpretation:  Sinus rhythm Low voltage, precordial leads Borderline T abnormalities, anterior leads Prolonged QT interval Baseline wander in lead(s) V3 No significant change since last tracing Confirmed by Benjiman CorePickering, Mirian Casco [phone] on 07/15/2017 8:25:09 AM       Radiology Dg Chest 2 View  Result Date: 07/15/2017 CLINICAL DATA:  51 year old female with mid chest pain since this morning with shortness of breath. EXAM: CHEST  2 VIEW COMPARISON:  06/23/2015 and earlier. FINDINGS: Lower lung volumes. Mediastinal contours remain normal. Bilateral crowding of lung markings, peptic early in the lower lobes on the lateral view. Mild chronic upper lobe predominant interstitial markings appear stable  compared to 2007 radiographs. No pneumothorax or pleural effusion. No consolidation. Visualized tracheal air column is within normal limits. Stable cholecystectomy clips. No acute osseous abnormality identified. Chronic bulky thoracic endplate osteophytosis. Negative visible bowel gas pattern. IMPRESSION: Lower lung volumes. Difficult to exclude mild or developing lower lobe infection. Upper lobe interstitial lung markings appear chronic. Repeat PA and lateral chest radiographs would be valuable if the clinical course is equivocal. Electronically Signed   By: Odessa FlemingH  Hall M.D.   On: 07/15/2017 09:15    Procedures Procedures (including critical care time)  Medications Ordered in ED Medications  gi cocktail (Maalox,Lidocaine,Donnatal) (30 mLs Oral Given 07/15/17 [id number])     Initial Impression / Assessment and Plan / ED Course  I have reviewed the triage vital signs and the nursing notes.  Pertinent labs & imaging results that were available during my care of the patient were reviewed by me and considered in my medical decision making (see chart for details).     Patient with chest pain.  Worse after eating today and somewhat improved with GI cocktail.  EKG reassuring.  Doubt pulmonary embolism or cardiac cause.  Negative troponin x2.  Will start on Prilosec and to follow-up with PCP for further evaluation as needed. Final Clinical Impressions(s) / ED Diagnoses   Final diagnoses:  Nonspecific chest pain    ED Discharge Orders        Ordered    omeprazole (PRILOSEC) 20 MG capsule  Daily     07/15/17 1239       Benjiman CorePickering, Ronique Simerly, MD 07/15/17 1240

## 2018-12-03 IMAGING — DX DG KNEE COMPLETE 4+V*R*
5 series · 5 of 5 positions shown · non-contrast
Comparison: None.

CLINICAL DATA: Fall 3 days ago. Right knee pain. Initial encounter.

EXAM:
RIGHT KNEE - COMPLETE 4+ VIEW

[knee ap (1 of 3)]
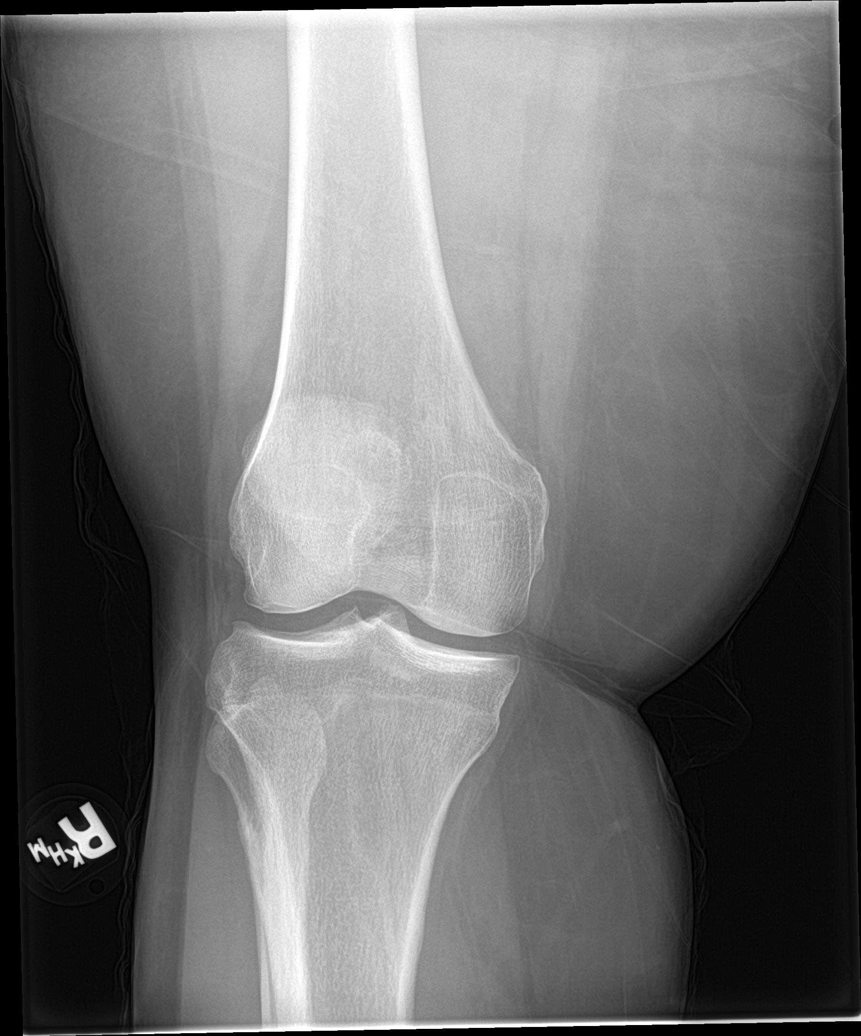

[knee lat (1 of 2)]
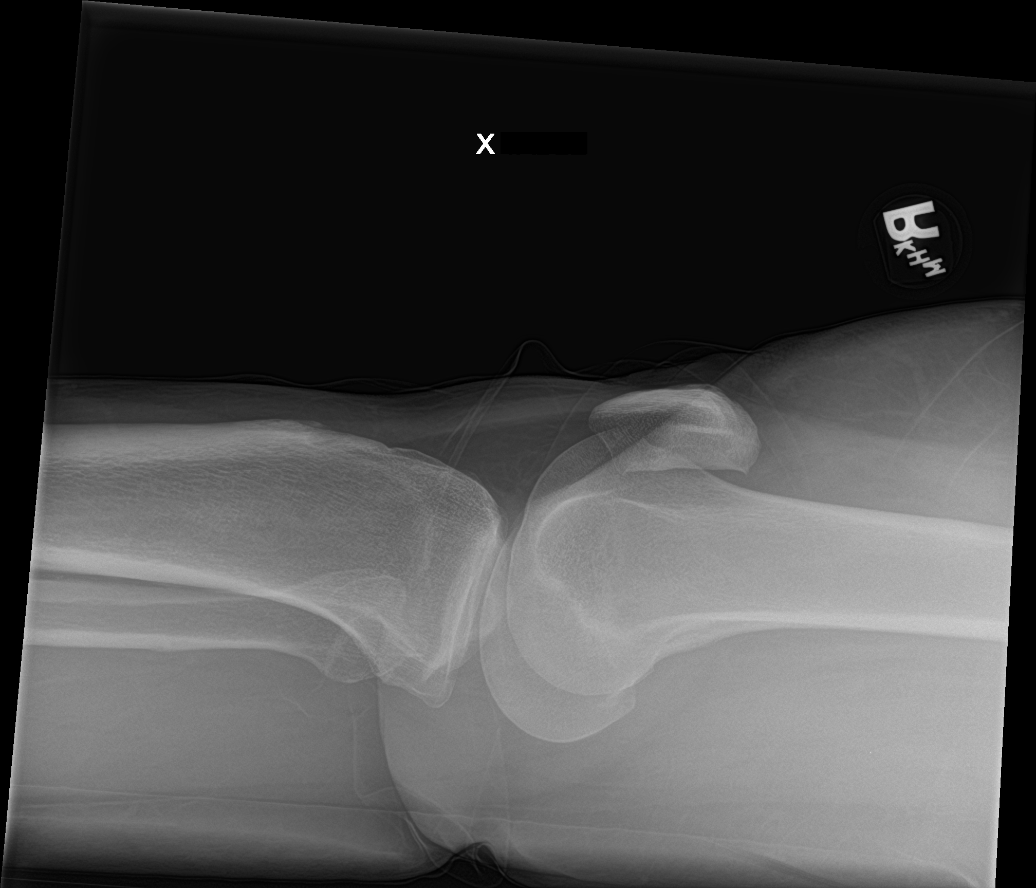

[knee ap (2 of 3)]
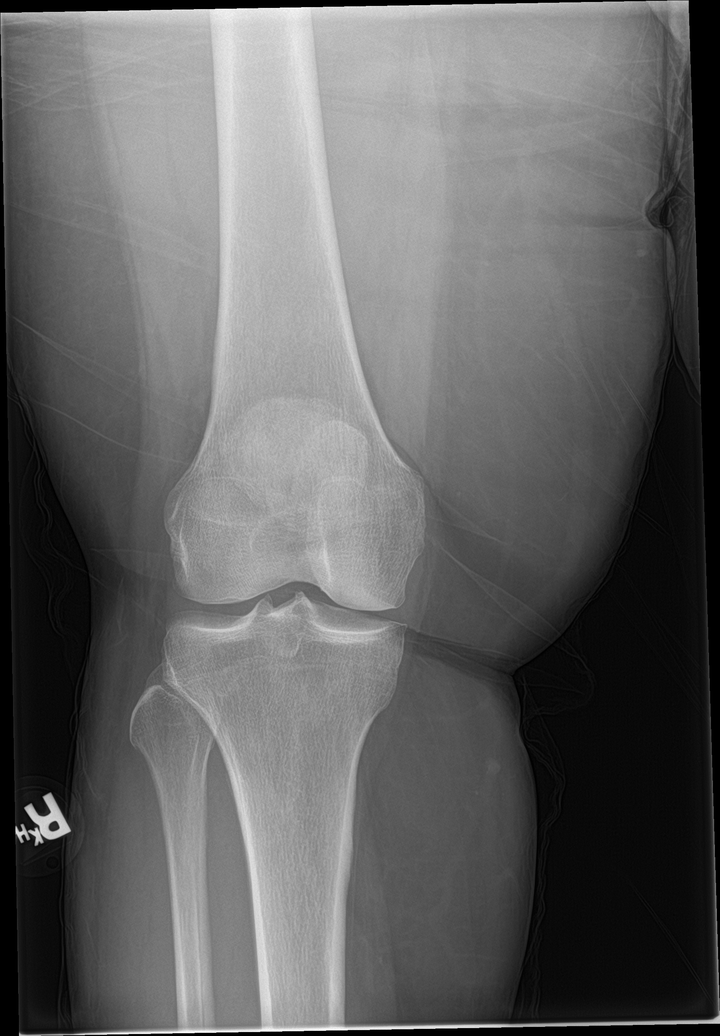

[knee ap (3 of 3)]
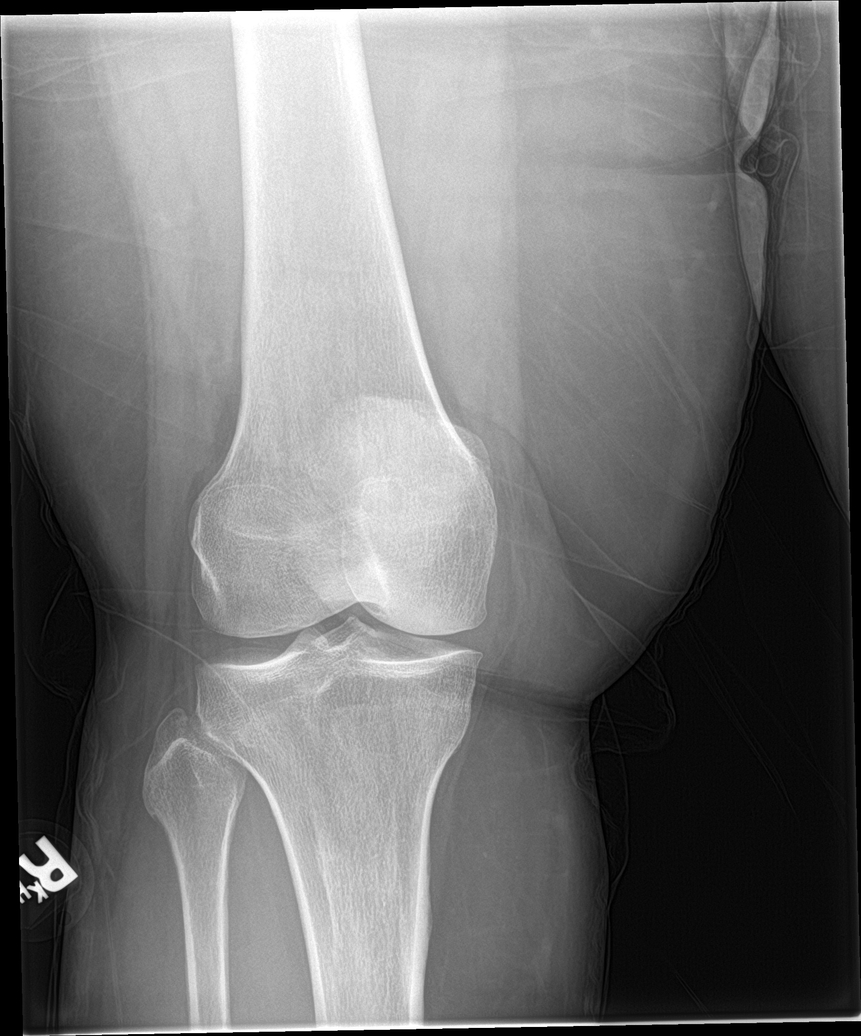

[knee lat (2 of 2)]
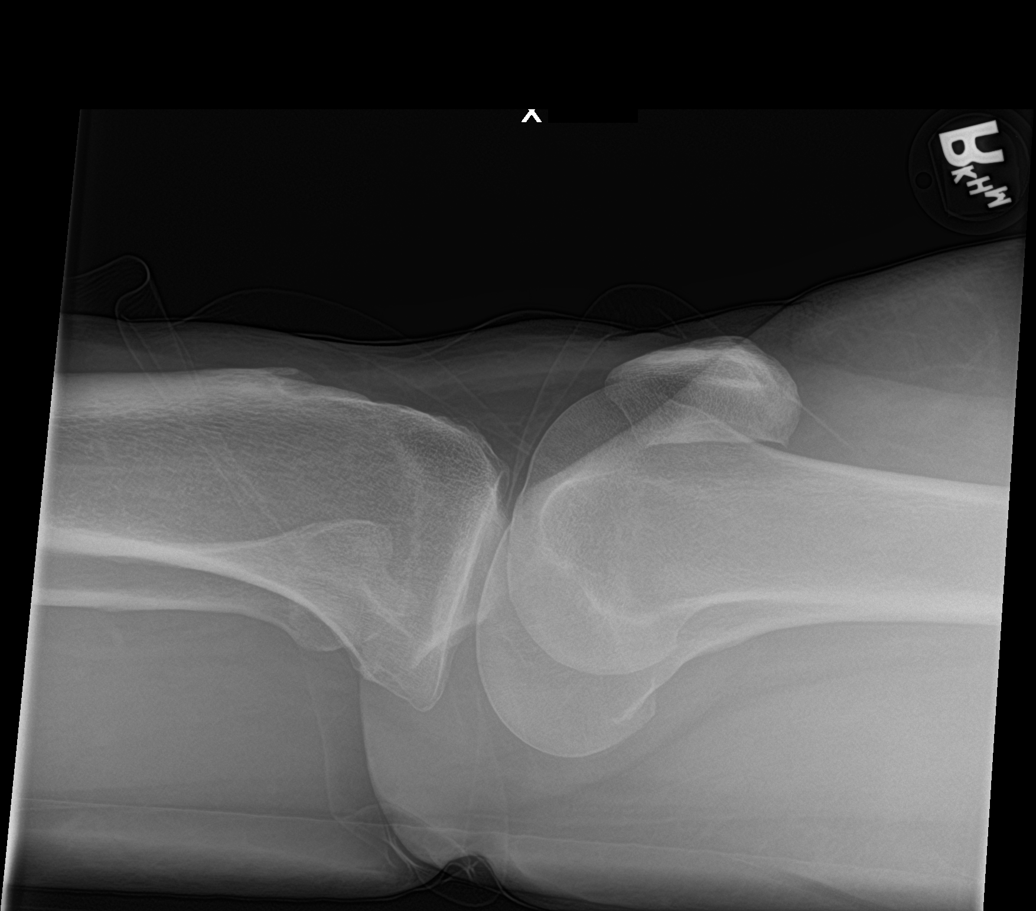

[5 of 5 positions shown; findings below may reference images not displayed]

FINDINGS: No evidence of fracture, dislocation, or joint effusion. No evidence
of arthropathy or other focal bone abnormality. Soft tissues are
unremarkable.
IMPRESSION: Negative.

## 2019-07-27 DIAGNOSIS — U071 COVID-19: Secondary | ICD-10-CM

## 2019-07-27 HISTORY — DX: COVID-19: U07.1

## 2019-11-08 ENCOUNTER — Ambulatory Visit: Payer: PRIVATE HEALTH INSURANCE | Admitting: Physician Assistant

## 2019-11-18 ENCOUNTER — Other Ambulatory Visit: Payer: Self-pay

## 2019-11-18 ENCOUNTER — Encounter: Payer: Self-pay | Admitting: Family Medicine

## 2019-11-18 ENCOUNTER — Ambulatory Visit (INDEPENDENT_AMBULATORY_CARE_PROVIDER_SITE_OTHER): Payer: Managed Care, Other (non HMO)

## 2019-11-18 ENCOUNTER — Ambulatory Visit: Payer: PRIVATE HEALTH INSURANCE | Admitting: Physician Assistant

## 2019-11-18 ENCOUNTER — Ambulatory Visit: Payer: PRIVATE HEALTH INSURANCE | Admitting: Family Medicine

## 2019-11-18 VITALS — BP 141/96 | HR 91 | Temp 98.0°F | Ht 64.0 in | Wt 188.6 lb

## 2019-11-18 DIAGNOSIS — M25511 Pain in right shoulder: Secondary | ICD-10-CM | POA: Diagnosis not present

## 2019-11-18 DIAGNOSIS — I10 Essential (primary) hypertension: Secondary | ICD-10-CM | POA: Diagnosis not present

## 2019-11-18 DIAGNOSIS — E539 Vitamin B deficiency, unspecified: Secondary | ICD-10-CM

## 2019-11-18 DIAGNOSIS — E559 Vitamin D deficiency, unspecified: Secondary | ICD-10-CM

## 2019-11-18 DIAGNOSIS — E1140 Type 2 diabetes mellitus with diabetic neuropathy, unspecified: Secondary | ICD-10-CM

## 2019-11-18 DIAGNOSIS — M545 Low back pain, unspecified: Secondary | ICD-10-CM

## 2019-11-18 DIAGNOSIS — G8929 Other chronic pain: Secondary | ICD-10-CM | POA: Diagnosis not present

## 2019-11-18 DIAGNOSIS — Z130 Encounter for screening for diseases of the blood and blood-forming organs and certain disorders involving the immune mechanism: Secondary | ICD-10-CM

## 2019-11-18 DIAGNOSIS — E782 Mixed hyperlipidemia: Secondary | ICD-10-CM | POA: Diagnosis not present

## 2019-11-18 DIAGNOSIS — Z794 Long term (current) use of insulin: Secondary | ICD-10-CM

## 2019-11-18 DIAGNOSIS — K5900 Constipation, unspecified: Secondary | ICD-10-CM

## 2019-11-18 LAB — BAYER DCA HB A1C WAIVED: HB A1C (BAYER DCA - WAIVED): 6.3 % (ref <7.0)

## 2019-11-18 MED ORDER — POLYETHYLENE GLYCOL 3350 17 G PO PACK: 17.0000 g | PACK | Freq: Every day | ORAL | 5 refills | Status: DC

## 2019-11-18 NOTE — Progress Notes (Signed)
New Patient Office Visit  Assessment & Plan:  1. Chronic bilateral low back pain without sciatica - Well controlled on current regimen.  Patient to return for a controlled substance agreement.  Urine drug screen collected today. - CMP14+EGFR - Microalbumin / creatinine urine ratio - Compliance Drug Analysis, Ur  2. Mixed hyperlipidemia - CMP14+EGFR - Lipid panel  3. Essential hypertension - Well controlled on current regimen.  - CMP14+EGFR  4. Type 2 diabetes mellitus with diabetic neuropathy, with long-term current use of insulin ([place]) Lab Results  Component Value Date   HGBA1C 6.3 11/18/2019   HGBA1C 7.3 (H) 11/06/2015   HGBA1C 7.4 (H) 06/20/2015    - Diabetes is at goal of A1c < 7. - Medications: continue current medications - Patient is currently taking a statin. Patient is taking an ACE-inhibitor/ARB.  - Urine Microalbumin/Creat Ratio: today - Bayer DCA Hb A1c Waived - CMP14+EGFR - Microalbumin / creatinine urine ratio  5. Chronic right shoulder pain - Exercises provided for patient to complete at home. - DG Shoulder Right  6. Vitamin D deficiency - VITAMIN D 25 Hydroxy (Vit-D Deficiency, Fractures)  7. Vitamin B deficiency - Vitamin B12  8. Constipation, unspecified constipation type - polyethylene glycol (MIRALAX / GLYCOLAX) 17 g packet; Take 17 g by mouth daily.  Dispense: 14 each; Refill: 5  9. Screening for deficiency anemia - CBC with Differential/Platelet   Follow-up: Return in about 4 weeks (around 12/16/2019) for pain contract.   Hendricks Limes, MSN, APRN, FNP-C [name]  Subjective:  Patient ID: Tammy Mayo, female    DOB: 02-06-1966  Age: 54 y.o. MRN: [id number]  Patient Care Team: Loman Brooklyn, FNP as PCP - General (Family Medicine)  CC:  Chief Complaint  Patient presents with  . New Patient (Initial Visit)    [facility]  . Establish Care    HPI Tammy Mayo  presents to establish care.  Patient is transferring care from [place] in [place].  Patient reports he takes tramadol for low back pain. She does have a history of a lumbar fusion at L4-L5 with screws and a spacer.  Patient reports he was put on estradiol and Provera by her previous PCP to keep her from having a period.  Patient states he takes Phenergan 25 mg twice a day for nausea and has had to do so for the past 5 years since she had her back surgery.  She has had a negative EGD and colonoscopy per her report.  Patient takes gabapentin for neuropathy of bilateral lower extremities.  Depression/Anxiety: Taking Cymbalta and Abilify.  Depression screen [facility] 2/9 11/18/2019  Decreased Interest 3  Down, Depressed, Hopeless 2  PHQ - 2 Score 5  Altered sleeping 1  Tired, decreased energy 2  Change in appetite 2  Feeling bad or failure about yourself  3  Trouble concentrating 3  Moving slowly or fidgety/restless 1  Suicidal thoughts 2  PHQ-9 Score 19   GAD 7 : Generalized Anxiety Score 11/18/2019  Nervous, Anxious, on Edge 2  Control/stop worrying 3  Worry too much - different things 3  Trouble relaxing 2  Restless 2  Easily annoyed or irritable 3  Afraid - awful might happen 2  Total GAD 7 Score 17    Patient is concerned about right shoulder pain.  She states she has had an injection in it approximately 4 months ago which was somewhat effective.  She  initially hurt her shoulder 7 months ago when she passed out.  She has never had imaging of this shoulder or done physical therapy.   ROS  Current Outpatient Medications:  .  ARIPiprazole (ABILIFY) 10 MG tablet, Take 10 mg by mouth daily., Disp: , Rfl:  .  Biotin 1000 MCG tablet, Take 1,000 mcg by mouth daily., Disp: , Rfl:  .  cetirizine (ZYRTEC) 10 MG tablet, Take 10 mg by mouth daily., Disp: , Rfl:  .  cholecalciferol (VITAMIN D) 1000 UNITS tablet, Take 1,000 Units by mouth daily., Disp: , Rfl:  .   Dapagliflozin-Metformin HCl ER (XIGDUO XR) 04-999 MG TB24, Take 1 tablet by mouth daily with supper., Disp: , Rfl:  .  docusate sodium (COLACE) 100 MG capsule, Take 100 mg by mouth daily., Disp: , Rfl:  .  DULoxetine (CYMBALTA) 30 MG capsule, Take 30 mg by mouth 2 (two) times daily., Disp: , Rfl:  .  estradiol (ESTRACE) 1 MG tablet, Take 1 mg by mouth daily., Disp: , Rfl:  .  gabapentin (NEURONTIN) 300 MG capsule, Take 300 mg by mouth 2 (two) times daily. , Disp: , Rfl:  .  lisinopril (ZESTRIL) 20 MG tablet, Take 20 mg by mouth daily., Disp: , Rfl:  .  medroxyPROGESTERone (PROVERA) 2.5 MG tablet, Take 2.5 mg by mouth daily., Disp: , Rfl:  .  omeprazole (PRILOSEC) 20 MG capsule, Take 1 capsule (20 mg total) by mouth daily., Disp: 14 capsule, Rfl: 0 .  oxymetazoline (AFRIN) 0.05 % nasal spray, Place 1 spray into both nostrils 2 (two) times daily as needed for congestion (over the counter nasal spray)., Disp: , Rfl:  .  OZEMPIC, 1 MG/DOSE, 2 MG/1.5ML SOPN, Inject 1 mg into the skin once a week., Disp: , Rfl:  .  polyethylene glycol (MIRALAX / GLYCOLAX) 17 g packet, Take 17 g by mouth daily., Disp: 14 each, Rfl: 5 .  promethazine (PHENERGAN) 25 MG tablet, Take 25 mg by mouth every 6 (six) hours as needed. , Disp: , Rfl:  .  rosuvastatin (CRESTOR) 10 MG tablet, Take 10 mg by mouth daily., Disp: , Rfl:  .  traMADol (ULTRAM) 50 MG tablet, Take 50 mg by mouth at bedtime. , Disp: , Rfl:  .  vitamin B-12 (CYANOCOBALAMIN) 500 MCG tablet, Take 500 mcg by mouth daily., Disp: , Rfl:   No Known Allergies  Past Medical History:  Diagnosis Date  . Anemia   . Arthritis   . Chronic back pain    lumbar 4-5 slip and radicular right leg pain  . Depression   . Diabetes mellitus without complication ([place])   . GERD (gastroesophageal reflux disease)   . Hyperlipidemia   . Hypertension   . Seasonal allergies   . Vitamin D deficiency   . Wears glasses     Past Surgical History:  Procedure Laterality Date  .  BACK SURGERY    . CESAREAN SECTION     x 1  . CHOLECYSTECTOMY N/A 11/13/2015   Procedure: LAPAROSCOPIC CHOLECYSTECTOMY;  Surgeon: Rolm Bookbinder, MD;  Location: [place];  Service: General;  Laterality: N/A;  . DILATION AND CURETTAGE OF UTERUS    . HERNIA REPAIR     umbilical hernia repair as a child  . TUBAL LIGATION      Family History  Problem Relation Age of Onset  . Stroke Mother   . Hypertension Mother   . Diabetes Mother   . Hypertension Father   . Diabetes Father   .  Kidney disease Father   . Hypertension Sister     Social History   Socioeconomic History  . Marital status: Married    Spouse name: Not on file  . Number of children: Not on file  . Years of education: Not on file  . Highest education level: Not on file  Occupational History  . Not on file  Tobacco Use  . Smoking status: Never Smoker  . Smokeless tobacco: Never Used  Substance and Sexual Activity  . Alcohol use: No  . Drug use: No  . Sexual activity: Yes    Birth control/protection: None  Other Topics Concern  . Not on file  Social History Narrative  . Not on file   Social Determinants of Health   Financial Resource Strain:   . Difficulty of Paying Living Expenses:   Food Insecurity:   . Worried About Charity fundraiser in the Last Year:   . Arboriculturist in the Last Year:   Transportation Needs:   . Film/video editor (Medical):   Marland Kitchen Lack of Transportation (Non-Medical):   Physical Activity:   . Days of Exercise per Week:   . Minutes of Exercise per Session:   Stress:   . Feeling of Stress :   Social Connections:   . Frequency of Communication with Friends and Family:   . Frequency of Social Gatherings with Friends and Family:   . Attends Religious Services:   . Active Member of Clubs or Organizations:   . Attends Archivist Meetings:   Marland Kitchen Marital Status:   Intimate Partner Violence:   . Fear of Current or Ex-Partner:   . Emotionally Abused:   Marland Kitchen Physically  Abused:   . Sexually Abused:     Objective:   Today's Vitals: BP (!) 141/96   Pulse 91   Temp 98 F (36.7 C) (Temporal)   Ht 5' 4" (1.626 m)   Wt 188 lb 9.6 oz (85.5 kg)   LMP  (LMP Unknown)   SpO2 96%   BMI 32.37 kg/m   Physical Exam Vitals reviewed.  Constitutional:      General: She is not in acute distress.    Appearance: Normal appearance. She is not ill-appearing, toxic-appearing or diaphoretic.  HENT:     Head: Normocephalic and atraumatic.  Eyes:     General: No scleral icterus.       Right eye: No discharge.        Left eye: No discharge.     Conjunctiva/sclera: Conjunctivae normal.  Cardiovascular:     Rate and Rhythm: Normal rate and regular rhythm.     Heart sounds: Normal heart sounds. No murmur. No friction rub. No gallop.   Pulmonary:     Effort: Pulmonary effort is normal. No respiratory distress.     Breath sounds: Normal breath sounds. No stridor. No wheezing, rhonchi or rales.  Musculoskeletal:     Right shoulder: Tenderness present. Decreased range of motion.     Cervical back: Normal range of motion.  Skin:    General: Skin is warm and dry.     Capillary Refill: Capillary refill takes less than 2 seconds.  Neurological:     General: No focal deficit present.     Mental Status: She is alert and oriented to person, place, and time. Mental status is at baseline.  Psychiatric:        Mood and Affect: Mood normal.        Behavior: Behavior normal.  Thought Content: Thought content normal.        Judgment: Judgment normal.

## 2019-11-18 NOTE — Patient Instructions (Signed)
Shoulder Exercises Ask your health care provider which exercises are safe for you. Do exercises exactly as told by your health care provider and adjust them as directed. It is normal to feel mild stretching, pulling, tightness, or discomfort as you do these exercises. Stop right away if you feel sudden pain or your pain gets worse. Do not begin these exercises until told by your health care provider. Stretching exercises External rotation and abduction This exercise is sometimes called corner stretch. This exercise rotates your arm outward (external rotation) and moves your arm out from your body (abduction). 1. Stand in a doorway with one of your feet slightly in front of the other. This is called a staggered stance. If you cannot reach your forearms to the door frame, stand facing a corner of a room. 2. Choose one of the following positions as told by your health care provider: ? Place your hands and forearms on the door frame above your head. ? Place your hands and forearms on the door frame at the height of your head. ? Place your hands on the door frame at the height of your elbows. 3. Slowly move your weight onto your front foot until you feel a stretch across your chest and in the front of your shoulders. Keep your head and chest upright and keep your abdominal muscles tight. 4. Hold for __________ seconds. 5. To release the stretch, shift your weight to your back foot. Repeat __________ times. Complete this exercise __________ times a day. Extension, standing 1. Stand and hold a broomstick, a cane, or a similar object behind your back. ? Your hands should be a little wider than shoulder width apart. ? Your palms should face away from your back. 2. Keeping your elbows straight and your shoulder muscles relaxed, move the stick away from your body until you feel a stretch in your shoulders (extension). ? Avoid shrugging your shoulders while you move the stick. Keep your shoulder blades tucked  down toward the middle of your back. 3. Hold for __________ seconds. 4. Slowly return to the starting position. Repeat __________ times. Complete this exercise __________ times a day. Range-of-motion exercises Pendulum  1. Stand near a wall or a surface that you can hold onto for balance. 2. Bend at the waist and let your left / right arm hang straight down. Use your other arm to support you. Keep your back straight and do not lock your knees. 3. Relax your left / right arm and shoulder muscles, and move your hips and your trunk so your left / right arm swings freely. Your arm should swing because of the motion of your body, not because you are using your arm or shoulder muscles. 4. Keep moving your hips and trunk so your arm swings in the following directions, as told by your health care provider: ? Side to side. ? Forward and backward. ? In clockwise and counterclockwise circles. 5. Continue each motion for __________ seconds, or for as long as told by your health care provider. 6. Slowly return to the starting position. Repeat __________ times. Complete this exercise __________ times a day. Shoulder flexion, standing  1. Stand and hold a broomstick, a cane, or a similar object. Place your hands a little more than shoulder width apart on the object. Your left / right hand should be palm up, and your other hand should be palm down. 2. Keep your elbow straight and your shoulder muscles relaxed. Push the stick up with your healthy arm to   raise your left / right arm in front of your body, and then over your head until you feel a stretch in your shoulder (flexion). ? Avoid shrugging your shoulder while you raise your arm. Keep your shoulder blade tucked down toward the middle of your back. 3. Hold for __________ seconds. 4. Slowly return to the starting position. Repeat __________ times. Complete this exercise __________ times a day. Shoulder abduction, standing 1. Stand and hold a broomstick,  a cane, or a similar object. Place your hands a little more than shoulder width apart on the object. Your left / right hand should be palm up, and your other hand should be palm down. 2. Keep your elbow straight and your shoulder muscles relaxed. Push the object across your body toward your left / right side. Raise your left / right arm to the side of your body (abduction) until you feel a stretch in your shoulder. ? Do not raise your arm above shoulder height unless your health care provider tells you to do that. ? If directed, raise your arm over your head. ? Avoid shrugging your shoulder while you raise your arm. Keep your shoulder blade tucked down toward the middle of your back. 3. Hold for __________ seconds. 4. Slowly return to the starting position. Repeat __________ times. Complete this exercise __________ times a day. Internal rotation  1. Place your left / right hand behind your back, palm up. 2. Use your other hand to dangle an exercise band, a towel, or a similar object over your shoulder. Grasp the band with your left / right hand so you are holding on to both ends. 3. Gently pull up on the band until you feel a stretch in the front of your left / right shoulder. The movement of your arm toward the center of your body is called internal rotation. ? Avoid shrugging your shoulder while you raise your arm. Keep your shoulder blade tucked down toward the middle of your back. 4. Hold for __________ seconds. 5. Release the stretch by letting go of the band and lowering your hands. Repeat __________ times. Complete this exercise __________ times a day. Strengthening exercises External rotation  1. Sit in a stable chair without armrests. 2. Secure an exercise band to a stable object at elbow height on your left / right side. 3. Place a soft object, such as a folded towel or a small pillow, between your left / right upper arm and your body to move your elbow about 4 inches (10 cm) away  from your side. 4. Hold the end of the exercise band so it is tight and there is no slack. 5. Keeping your elbow pressed against the soft object, slowly move your forearm out, away from your abdomen (external rotation). Keep your body steady so only your forearm moves. 6. Hold for __________ seconds. 7. Slowly return to the starting position. Repeat __________ times. Complete this exercise __________ times a day. Shoulder abduction  1. Sit in a stable chair without armrests, or stand up. 2. Hold a __________ weight in your left / right hand, or hold an exercise band with both hands. 3. Start with your arms straight down and your left / right palm facing in, toward your body. 4. Slowly lift your left / right hand out to your side (abduction). Do not lift your hand above shoulder height unless your health care provider tells you that this is safe. ? Keep your arms straight. ? Avoid shrugging your shoulder while you   do this movement. Keep your shoulder blade tucked down toward the middle of your back. 5. Hold for __________ seconds. 6. Slowly lower your arm, and return to the starting position. Repeat __________ times. Complete this exercise __________ times a day. Shoulder extension 1. Sit in a stable chair without armrests, or stand up. 2. Secure an exercise band to a stable object in front of you so it is at shoulder height. 3. Hold one end of the exercise band in each hand. Your palms should face each other. 4. Straighten your elbows and lift your hands up to shoulder height. 5. Step back, away from the secured end of the exercise band, until the band is tight and there is no slack. 6. Squeeze your shoulder blades together as you pull your hands down to the sides of your thighs (extension). Stop when your hands are straight down by your sides. Do not let your hands go behind your body. 7. Hold for __________ seconds. 8. Slowly return to the starting position. Repeat __________ times.  Complete this exercise __________ times a day. Shoulder row 1. Sit in a stable chair without armrests, or stand up. 2. Secure an exercise band to a stable object in front of you so it is at waist height. 3. Hold one end of the exercise band in each hand. Position your palms so that your thumbs are facing the ceiling (neutral position). 4. Bend each of your elbows to a 90-degree angle (right angle) and keep your upper arms at your sides. 5. Step back until the band is tight and there is no slack. 6. Slowly pull your elbows back behind you. 7. Hold for __________ seconds. 8. Slowly return to the starting position. Repeat __________ times. Complete this exercise __________ times a day. Shoulder press-ups  1. Sit in a stable chair that has armrests. Sit upright, with your feet flat on the floor. 2. Put your hands on the armrests so your elbows are bent and your fingers are pointing forward. Your hands should be about even with the sides of your body. 3. Push down on the armrests and use your arms to lift yourself off the chair. Straighten your elbows and lift yourself up as much as you comfortably can. ? Move your shoulder blades down, and avoid letting your shoulders move up toward your ears. ? Keep your feet on the ground. As you get stronger, your feet should support less of your body weight as you lift yourself up. 4. Hold for __________ seconds. 5. Slowly lower yourself back into the chair. Repeat __________ times. Complete this exercise __________ times a day. Wall push-ups  1. Stand so you are facing a stable wall. Your feet should be about one arm-length away from the wall. 2. Lean forward and place your palms on the wall at shoulder height. 3. Keep your feet flat on the floor as you bend your elbows and lean forward toward the wall. 4. Hold for __________ seconds. 5. Straighten your elbows to push yourself back to the starting position. Repeat __________ times. Complete this exercise  __________ times a day. This information is not intended to replace advice given to you by your health care provider. Make sure you discuss any questions you have with your health care provider. Document Revised: 11/06/2018 Document Reviewed: 08/14/2018 [employer] Patient Education  2020 [employer]  

## 2019-11-19 LAB — CBC WITH DIFFERENTIAL/PLATELET
Basophils Absolute: 0 10*3/uL (ref 0.0–0.2)
Basos: 1 %
EOS (ABSOLUTE): 0.1 10*3/uL (ref 0.0–0.4)
Eos: 1 %
Hematocrit: 40.7 % (ref 34.0–46.6)
Hemoglobin: 13.6 g/dL (ref 11.1–15.9)
Immature Grans (Abs): 0 10*3/uL (ref 0.0–0.1)
Immature Granulocytes: 0 %
Lymphocytes Absolute: 2.9 10*3/uL (ref 0.7–3.1)
Lymphs: 49 %
MCH: 29.5 pg (ref 26.6–33.0)
MCHC: 33.4 g/dL (ref 31.5–35.7)
MCV: 88 fL (ref 79–97)
Monocytes Absolute: 0.4 10*3/uL (ref 0.1–0.9)
Monocytes: 7 %
Neutrophils Absolute: 2.6 10*3/uL (ref 1.4–7.0)
Neutrophils: 42 %
Platelets: 318 10*3/uL (ref 150–450)
RBC: 4.61 x10E6/uL (ref 3.77–5.28)
RDW: 13.1 % (ref 11.7–15.4)
WBC: 6.1 10*3/uL (ref 3.4–10.8)

## 2019-11-19 LAB — CMP14+EGFR
ALT: 13 IU/L (ref 0–32)
AST: 16 IU/L (ref 0–40)
Albumin/Globulin Ratio: 1.8 (ref 1.2–2.2)
Albumin: 4.4 g/dL (ref 3.8–4.9)
Alkaline Phosphatase: 92 IU/L (ref 39–117)
BUN/Creatinine Ratio: 5 — ABNORMAL LOW (ref 9–23)
BUN: 4 mg/dL — ABNORMAL LOW (ref 6–24)
Bilirubin Total: 0.3 mg/dL (ref 0.0–1.2)
CO2: 26 mmol/L (ref 20–29)
Calcium: 9.5 mg/dL (ref 8.7–10.2)
Chloride: 107 mmol/L — ABNORMAL HIGH (ref 96–106)
Creatinine, Ser: 0.76 mg/dL (ref 0.57–1.00)
GFR calc Af Amer: 104 mL/min/{1.73_m2} (ref >59)
GFR calc non Af Amer: 90 mL/min/{1.73_m2} (ref >59)
Globulin, Total: 2.4 g/dL (ref 1.5–4.5)
Glucose: 84 mg/dL (ref 65–99)
Potassium: 3.8 mmol/L (ref 3.5–5.2)
Sodium: 147 mmol/L — ABNORMAL HIGH (ref 134–144)
Total Protein: 6.8 g/dL (ref 6.0–8.5)

## 2019-11-19 LAB — LIPID PANEL
Chol/HDL Ratio: 3 ratio (ref 0.0–4.4)
Cholesterol, Total: 131 mg/dL (ref 100–199)
HDL: 44 mg/dL (ref >39)
LDL Chol Calc (NIH): 73 mg/dL (ref 0–99)
Triglycerides: 67 mg/dL (ref 0–149)
VLDL Cholesterol Cal: 14 mg/dL (ref 5–40)

## 2019-11-19 LAB — MICROALBUMIN / CREATININE URINE RATIO
Creatinine, Urine: 90.4 mg/dL
Microalb/Creat Ratio: 17 mg/g creat (ref 0–29)
Microalbumin, Urine: 15.8 ug/mL

## 2019-11-19 LAB — VITAMIN D 25 HYDROXY (VIT D DEFICIENCY, FRACTURES): Vit D, 25-Hydroxy: 29.4 ng/mL — ABNORMAL LOW (ref 30.0–100.0)

## 2019-11-19 LAB — VITAMIN B12: Vitamin B-12: 392 pg/mL (ref 232–1245)

## 2019-11-22 ENCOUNTER — Encounter: Payer: Self-pay | Admitting: Family Medicine

## 2019-11-22 DIAGNOSIS — Z794 Long term (current) use of insulin: Secondary | ICD-10-CM | POA: Insufficient documentation

## 2019-11-22 DIAGNOSIS — K5900 Constipation, unspecified: Secondary | ICD-10-CM | POA: Insufficient documentation

## 2019-11-22 DIAGNOSIS — G8929 Other chronic pain: Secondary | ICD-10-CM | POA: Insufficient documentation

## 2019-11-22 DIAGNOSIS — M25511 Pain in right shoulder: Secondary | ICD-10-CM | POA: Insufficient documentation

## 2019-11-22 DIAGNOSIS — E1140 Type 2 diabetes mellitus with diabetic neuropathy, unspecified: Secondary | ICD-10-CM | POA: Insufficient documentation

## 2019-11-22 DIAGNOSIS — E1165 Type 2 diabetes mellitus with hyperglycemia: Secondary | ICD-10-CM | POA: Insufficient documentation

## 2019-11-22 LAB — COMPLIANCE DRUG ANALYSIS, UR

## 2019-11-23 ENCOUNTER — Other Ambulatory Visit: Payer: Self-pay | Admitting: Family Medicine

## 2019-11-23 DIAGNOSIS — R936 Abnormal findings on diagnostic imaging of limbs: Secondary | ICD-10-CM

## 2019-11-23 DIAGNOSIS — G8929 Other chronic pain: Secondary | ICD-10-CM

## 2019-11-23 DIAGNOSIS — M25511 Pain in right shoulder: Secondary | ICD-10-CM

## 2019-12-09 ENCOUNTER — Encounter: Payer: Self-pay | Admitting: Orthopaedic Surgery

## 2019-12-09 ENCOUNTER — Other Ambulatory Visit: Payer: Self-pay

## 2019-12-09 ENCOUNTER — Ambulatory Visit (INDEPENDENT_AMBULATORY_CARE_PROVIDER_SITE_OTHER): Payer: Managed Care, Other (non HMO) | Admitting: Orthopaedic Surgery

## 2019-12-09 VITALS — BP 128/98 | HR 97 | Temp 97.2°F | Ht 64.0 in | Wt 183.0 lb

## 2019-12-09 DIAGNOSIS — G8929 Other chronic pain: Secondary | ICD-10-CM | POA: Diagnosis not present

## 2019-12-09 DIAGNOSIS — M25511 Pain in right shoulder: Secondary | ICD-10-CM | POA: Diagnosis not present

## 2019-12-09 NOTE — Progress Notes (Signed)
Subjective:    Patient ID: Tammy Mayo, female    DOB: [birth year], 54 y.o.   MRN: [id number]  HPI She has a fall and hurt her right shoulder about eight months ago.  She has had tenderness and increasing pain in the shoulder since then.  She was seen and had X-rays on 11-18-2019 showing: IMPRESSION: 1. 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 Subcortical cystic change in the lateral humeral head typically seen with rotator cuff disease. 2. Subacromial spur.  I have independently reviewed and interpreted x-rays of this patient done at another site by another physician or qualified health professional.  I have also reviewed her records from [place].  She has more pain at night and in raising her arm.  She has no numbness, no weakness.  She has no swelling.   Review of Systems  Constitutional: Positive for activity change.  Musculoskeletal: Positive for arthralgias.  Allergic/Immunologic: Positive for environmental allergies.  All other systems reviewed and are negative.  For Review of Systems, all other systems reviewed and are negative.  The following is a summary of the past history medically, past history surgically, known current medicines, social history and family history.  This information is gathered electronically by the computer from prior information and documentation.  I review this each visit and have found including this information at this point in the chart is beneficial and informative.   Past Medical History:  Diagnosis Date  . Anemia   . Arthritis   . Chronic back pain    lumbar 4-5 slip and radicular right leg pain  . Depression   . Diabetes mellitus without complication (HCC)   . GERD (gastroesophageal reflux disease)   . Hyperlipidemia   . Hypertension   . Seasonal allergies   . Vitamin D deficiency   . Wears glasses     Past Surgical History:  Procedure Laterality Date  . BACK  SURGERY    . CESAREAN SECTION     x 1  . CHOLECYSTECTOMY N/A 11/13/2015   Procedure: LAPAROSCOPIC CHOLECYSTECTOMY;  Surgeon: Emelia Loron, MD;  Location: [facility] OR;  Service: General;  Laterality: N/A;  . DILATION AND CURETTAGE OF UTERUS    . HERNIA REPAIR     umbilical hernia repair as a child  . TUBAL LIGATION      Current Outpatient Medications on File Prior to Visit  Medication Sig Dispense Refill  . ARIPiprazole (ABILIFY) 10 MG tablet Take 10 mg by mouth daily.    . Biotin 1000 MCG tablet Take 1,000 mcg by mouth daily.    . cetirizine (ZYRTEC) 10 MG tablet Take 10 mg by mouth daily.    . cholecalciferol (VITAMIN D) 1000 UNITS tablet Take 1,000 Units by mouth daily.    . Dapagliflozin-Metformin HCl ER (XIGDUO XR) 04-999 MG TB24 Take 1 tablet by mouth daily with supper.    . docusate sodium (COLACE) 100 MG capsule Take 100 mg by mouth daily.    . DULoxetine (CYMBALTA) 30 MG capsule Take 30 mg by mouth 2 (two) times daily.    Marland Kitchen estradiol (ESTRACE) 1 MG tablet Take 1 mg by mouth daily.    Marland Kitchen gabapentin (NEURONTIN) 300 MG capsule Take 300 mg by mouth 2 (two) times daily.     Marland Kitchen lisinopril (ZESTRIL) 20 MG tablet Take 20 mg by mouth daily.    . medroxyPROGESTERone (PROVERA) 2.5 MG tablet Take 2.5 mg by mouth daily.    Marland Kitchen omeprazole (  PRILOSEC) 20 MG capsule Take 1 capsule (20 mg total) by mouth daily. 14 capsule 0  . oxymetazoline (AFRIN) 0.05 % nasal spray Place 1 spray into both nostrils 2 (two) times daily as needed for congestion (over the counter nasal spray).    Marland Kitchen OZEMPIC, 1 MG/DOSE, 2 MG/1.5ML SOPN Inject 1 mg into the skin once a week.    . polyethylene glycol (MIRALAX / GLYCOLAX) 17 g packet Take 17 g by mouth daily. 14 each 5  . promethazine (PHENERGAN) 25 MG tablet Take 25 mg by mouth every 6 (six) hours as needed.     . rosuvastatin (CRESTOR) 10 MG tablet Take 10 mg by mouth daily.    . traMADol (ULTRAM) 50 MG tablet Take 50 mg by mouth at bedtime.     . vitamin B-12  (CYANOCOBALAMIN) 500 MCG tablet Take 500 mcg by mouth daily.     No current facility-administered medications on file prior to visit.    Social History   Socioeconomic History  . Marital status: Married    Spouse name: Not on file  . Number of children: Not on file  . Years of education: Not on file  . Highest education level: Not on file  Occupational History  . Not on file  Tobacco Use  . Smoking status: Never Smoker  . Smokeless tobacco: Never Used  Substance and Sexual Activity  . Alcohol use: No  . Drug use: No  . Sexual activity: Yes    Birth control/protection: None  Other Topics Concern  . Not on file  Social History Narrative  . Not on file   Social Determinants of Health   Financial Resource Strain:   . Difficulty of Paying Living Expenses:   Food Insecurity:   . Worried About Programme researcher, broadcasting/film/video in the Last Year:   . Barista in the Last Year:   Transportation Needs:   . Freight forwarder (Medical):   Marland Kitchen Lack of Transportation (Non-Medical):   Physical Activity:   . Days of Exercise per Week:   . Minutes of Exercise per Session:   Stress:   . Feeling of Stress :   Social Connections:   . Frequency of Communication with Friends and Family:   . Frequency of Social Gatherings with Friends and Family:   . Attends Religious Services:   . Active Member of Clubs or Organizations:   . Attends Banker Meetings:   Marland Kitchen Marital Status:   Intimate Partner Violence:   . Fear of Current or Ex-Partner:   . Emotionally Abused:   Marland Kitchen Physically Abused:   . Sexually Abused:     Family History  Problem Relation Age of Onset  . Stroke Mother   . Hypertension Mother   . Diabetes Mother   . Hypertension Father   . Diabetes Father   . Kidney disease Father   . Hypertension Sister     BP (!) 128/98   Pulse 97   Temp (!) 97.2 F (36.2 C)   Ht 5\' 4"  (1.626 m)   Wt 183 lb (83 kg)   LMP  (LMP Unknown)   BMI 31.41 kg/m   Body mass index  is 31.41 kg/m.     Objective:   Physical Exam Vitals and nursing note reviewed.  Constitutional:      Appearance: She is well-developed.  HENT:     Head: Normocephalic and atraumatic.  Eyes:     Conjunctiva/sclera: Conjunctivae normal.  Pupils: Pupils are equal, round, and reactive to light.  Cardiovascular:     Rate and Rhythm: Normal rate and regular rhythm.  Pulmonary:     Effort: Pulmonary effort is normal.  Abdominal:     Palpations: Abdomen is soft.  Musculoskeletal:       Arms:     Cervical back: Normal range of motion and neck supple.  Skin:    General: Skin is warm and dry.  Neurological:     Mental Status: She is alert and oriented to person, place, and time.     Cranial Nerves: No cranial nerve deficit.     Motor: No abnormal muscle tone.     Coordination: Coordination normal.     Deep Tendon Reflexes: Reflexes are normal and symmetric. Reflexes normal.  Psychiatric:        Behavior: Behavior normal.        Thought Content: Thought content normal.        Judgment: Judgment normal.           Assessment & Plan:   Encounter Diagnosis  Name Primary?  . Chronic right shoulder pain Yes   PROCEDURE NOTE:  The patient request injection, verbal consent was obtained.  The right shoulder was prepped appropriately after time out was performed.   Sterile technique was observed and injection of 1 cc of Depo-Medrol 40 mg with several cc's of plain xylocaine. Anesthesia was provided by ethyl chloride and a 20-gauge needle was used to inject the shoulder area. A posterior approach was used.  The injection was tolerated well.  A band aid dressing was applied.  The patient was advised to apply ice later today and tomorrow to the injection sight as needed.  I will see her back in three weeks and see how she is doing.  She may need MRI.  I will begin PT in [place].  Call if any problem.  Precautions discussed.   Electronically Signed Sanjuana Kava,  MD 5/13/202111:28 AM

## 2019-12-15 ENCOUNTER — Other Ambulatory Visit: Payer: Self-pay

## 2019-12-15 ENCOUNTER — Ambulatory Visit: Payer: Managed Care, Other (non HMO) | Attending: Orthopaedic Surgery | Admitting: Physical Therapy

## 2019-12-15 DIAGNOSIS — M25511 Pain in right shoulder: Secondary | ICD-10-CM | POA: Diagnosis present

## 2019-12-15 DIAGNOSIS — G8929 Other chronic pain: Secondary | ICD-10-CM | POA: Insufficient documentation

## 2019-12-15 DIAGNOSIS — M25611 Stiffness of right shoulder, not elsewhere classified: Secondary | ICD-10-CM | POA: Insufficient documentation

## 2019-12-15 DIAGNOSIS — M6281 Muscle weakness (generalized): Secondary | ICD-10-CM | POA: Diagnosis present

## 2019-12-15 NOTE — Therapy (Signed)
[facility] [place], [place], [address] Phone: [phone]   Fax:  [phone]  Physical Therapy Evaluation  Patient Details  Name: Tammy Mayo MRN: [id number] Date of Birth: 1966/05/21 Referring Provider (PT): Sanjuana Kava MD   Encounter Date: 12/15/2019  PT End of Session - 12/15/19 1309    Visit Number  1    Number of Visits  12    Date for PT Re-Evaluation  01/26/20    PT Start Time  0174    PT Stop Time  1338    PT Time Calculation (min)  45 min    Activity Tolerance  Patient tolerated treatment well    Behavior During Therapy  [facility] for tasks assessed/performed       Past Medical History:  Diagnosis Date  . Anemia   . Arthritis   . Chronic back pain    lumbar 4-5 slip and radicular right leg pain  . Depression   . Diabetes mellitus without complication ([place])   . GERD (gastroesophageal reflux disease)   . Hyperlipidemia   . Hypertension   . Seasonal allergies   . Vitamin D deficiency   . Wears glasses     Past Surgical History:  Procedure Laterality Date  . BACK SURGERY    . CESAREAN SECTION     x 1  . CHOLECYSTECTOMY N/A 11/13/2015   Procedure: LAPAROSCOPIC CHOLECYSTECTOMY;  Surgeon: Rolm Bookbinder, MD;  Location: [facility];  Service: General;  Laterality: N/A;  . DILATION AND CURETTAGE OF UTERUS    . HERNIA REPAIR     umbilical hernia repair as a child  . TUBAL LIGATION      There were no vitals filed for this visit.   Subjective Assessment - 12/15/19 1324    Subjective  COVID-19 screen performed prior to patient entering clinic. The patient presents to the clinic today with c/o chronic right shoulder pain that has been ongoing for about 7 months.  She states she passed out due to a medicine she was on and fell upon her right shoulder.  Her pain-level today is a 6/10 and cann rise to 10/10 when lying on her right side and certain right shoulder movements.  Medication has helps relief her  pain somewhat.  She received an injection which "calmed it down a bit."  Patient will benefit from skilled physical therapy intervention to address deficits and pain.    Pertinent History  DM, HTN, lumbar surgery.    Diagnostic tests  X-ray.    Patient Stated Goals  Use right arm without pain.  Sleep on right side.    Currently in Pain?  Yes    Pain Score  6     Pain Location  Shoulder    Pain Orientation  Right    Pain Descriptors / Indicators  Aching;Sore;Throbbing    Pain Type  Chronic pain    Pain Onset  More than a month ago    Pain Frequency  Constant    Aggravating Factors   See above.    Pain Relieving Factors  See above.         [facility] PT Assessment - 12/15/19 0001      Assessment   Medical Diagnosis  Chronic right shoulder pain.    Referring Provider (PT)  Sanjuana Kava MD    Onset Date/Surgical Date  --   ~7 months.     Precautions   Precautions  None      Restrictions  Weight Bearing Restrictions  No      Balance Screen   Has the patient fallen in the past 6 months  No    Has the patient had a decrease in activity level because of a fear of falling?   Yes    Is the patient reluctant to leave their home because of a fear of falling?   No      Home Environment   Living Environment  Private residence      Prior Function   Level of Independence  Independent      Posture/Postural Control   Posture/Postural Control  Postural limitations    Postural Limitations  Rounded Shoulders;Forward head      ROM / Strength   AROM / PROM / Strength  AROM;Strength      AROM   Overall AROM Comments  Seated right shoulder flexion to 105 degrees and passive to 115 degrees, ER= 44 degrees and passive= 55 degrees, behind back to right hip region.      Strength   Overall Strength Comments  Right shoulder ER and IR= 4-/5, flexion and abduction= 3 to 3+/5.      Palpation   Palpation comment  Pain reported "in' shoulder with movement.      Special Tests   Other special  tests  (-) Right Drop Arm test.                  Objective measurements completed on examination: See above findings.      [facility] Adult PT Treatment/Exercise - 12/15/19 0001      Modalities   Modalities  Electrical Stimulation;Moist Heat      Electrical Stimulation   Electrical Stimulation Location  Right shoulder.    Electrical Stimulation Action  IFC at 80-150 Hz x 15 minutes.    Electrical Stimulation Goals  Pain             PT Education - 12/15/19 1353    Education Details  HEP.    Person(s) Educated  Patient    Methods  Explanation;Demonstration;Handout    Comprehension  Verbalized understanding;Returned demonstration          PT Long Term Goals - 12/15/19 1411      PT LONG TERM GOAL #1   Title  Independent with a HEP.    Time  6    Period  Weeks    Status  New      PT LONG TERM GOAL #2   Title  Active right shoulder flexion to 145 degrees so the patient can easily reach overhead.    Time  6    Period  Weeks    Status  New      PT LONG TERM GOAL #3   Title  Active ER to 70 degrees+ to allow for easily donning/doffing of apparel.    Time  6    Period  Weeks    Status  New      PT LONG TERM GOAL #4   Title  Increase ROM so patient is able to reach behind back to L3.    Time  6    Period  Weeks    Status  New      PT LONG TERM GOAL #5   Title  Increase right shoulder strength to a solid 4+/5 to increase stability for performance of functional activities.    Time  6    Period  Weeks    Status  New  PT LONG TERM GOAL #6   Title  Perform ADL's with pain not > 3/10.    Time  6    Period  Weeks    Status  New             Plan - 12/15/19 1354    Clinical Impression Statement  The patient presents to OPPT with c/o right shoulder pain that has been ongoing for about 7 months.  She has significant losses of rightr shoulder range of motion and loss of functional use.  She feel like most of her pain is coming from "in" her  shoulder.  Patient will benefit from skilled physical therapy intervention to address deficits and pain.    Personal Factors and Comorbidities  Comorbidity 1;Comorbidity 2    Comorbidities  DM, HTN, lumbar surgery.    Examination-Activity Limitations  Reach Overhead;Other    Examination-Participation Restrictions  Other    Stability/Clinical Decision Making  Evolving/Moderate complexity    Clinical Decision Making  Low    Rehab Potential  Good    PT Frequency  --   2-3 times a week.   PT Duration  6 weeks    PT Treatment/Interventions  ADLs/Self Care Home Management;Cryotherapy;Electrical Stimulation;Ultrasound;Moist Heat;Therapeutic activities;Therapeutic exercise;Manual techniques;Patient/family education;Passive range of motion;Dry needling;Joint Manipulations;Vasopneumatic Device    PT Next Visit Plan  Pulleys, UE Ranger, right shoulder PROM and capsular stretching.  Progress to PRE's.  Modalities PRN.    Consulted and Agree with Plan of Care  Patient       Patient will benefit from skilled therapeutic intervention in order to improve the following deficits and impairments:  Pain, Decreased activity tolerance, Decreased strength, Decreased range of motion  Visit Diagnosis: Chronic right shoulder pain - Plan: PT plan of care cert/re-cert  Stiffness of right shoulder, not elsewhere classified - Plan: PT plan of care cert/re-cert  Muscle weakness (generalized) - Plan: PT plan of care cert/re-cert     Problem List Patient Active Problem List   Diagnosis Date Noted  . Chronic right shoulder pain 11/22/2019  . Constipation 11/22/2019  . Type 2 diabetes mellitus with diabetic neuropathy, with long-term current use of insulin (HCC) 11/22/2019  . Back pain 06/21/2015  . HTN (hypertension) 10/23/2010  . Vitamin D deficiency 10/23/2010  . Hyperlipemia 10/23/2010  . Plantar fasciitis 10/23/2010    Toben Acuna, [place] MPT 12/15/2019, 2:14 PM  [facility] [address] [place], [place], [address] Phone: [phone]   Fax:  [phone]  Name: Tammy Mayo MRN: [id number] Date of Birth: 14-Aug-1965

## 2019-12-17 ENCOUNTER — Ambulatory Visit: Payer: Managed Care, Other (non HMO) | Admitting: Family Medicine

## 2019-12-17 ENCOUNTER — Other Ambulatory Visit: Payer: Self-pay

## 2019-12-17 ENCOUNTER — Encounter: Payer: Self-pay | Admitting: Family Medicine

## 2019-12-17 VITALS — BP 120/86 | HR 88 | Temp 97.5°F | Ht 64.0 in | Wt 181.2 lb

## 2019-12-17 DIAGNOSIS — Z79899 Other long term (current) drug therapy: Secondary | ICD-10-CM | POA: Diagnosis not present

## 2019-12-17 DIAGNOSIS — G8929 Other chronic pain: Secondary | ICD-10-CM

## 2019-12-17 DIAGNOSIS — M545 Low back pain: Secondary | ICD-10-CM

## 2019-12-17 DIAGNOSIS — Z79890 Hormone replacement therapy (postmenopausal): Secondary | ICD-10-CM

## 2019-12-17 DIAGNOSIS — M25511 Pain in right shoulder: Secondary | ICD-10-CM | POA: Diagnosis not present

## 2019-12-17 DIAGNOSIS — E1140 Type 2 diabetes mellitus with diabetic neuropathy, unspecified: Secondary | ICD-10-CM

## 2019-12-17 DIAGNOSIS — Z794 Long term (current) use of insulin: Secondary | ICD-10-CM

## 2019-12-17 MED ORDER — HYDROCODONE-ACETAMINOPHEN 5-325 MG PO TABS: 1.0000 | ORAL_TABLET | Freq: Every day | ORAL | 0 refills | Status: DC | PRN

## 2019-12-17 NOTE — Progress Notes (Signed)
Assessment & Plan:  1-3. Chronic right shoulder pain/Chronic bilateral low back pain without sciatica/Controlled substance agreement signed - D/C'd Tramadol and started patient on Norco 5/325 to better control her pain. Patient was referred to ortho for right shoulder pain and they referred to PT.  - HYDROcodone-acetaminophen (NORCO) 5-325 MG tablet; Take 1 tablet by mouth daily as needed for moderate pain.  Dispense: 30 tablet; Refill: 0 - HYDROcodone-acetaminophen (NORCO) 5-325 MG tablet; Take 1 tablet by mouth daily as needed for moderate pain.  Dispense: 30 tablet; Refill: 0 - HYDROcodone-acetaminophen (NORCO) 5-325 MG tablet; Take 1 tablet by mouth daily as needed for moderate pain.  Dispense: 30 tablet; Refill: 0  4. Hormone replacement therapy - I have messaged an OBGYN asking for input to taking patient off Estradiol and Provera.   5. Type 2 diabetes mellitus with diabetic neuropathy, with long-term current use of insulin (HCC) - Encouraged patient to stay on Ozempic and get max benefit from weight loss and then we could see about decreasing her dosage depending on her A1cs.    Return in about 3 months (around 03/18/2020) for follow-up of chronic medication conditions.  Deliah Boston, MSN, APRN, FNP-C [facility]  Subjective:    Patient ID: Tammy Mayo, female    DOB: [birth year], 54 y.o.   MRN: [id number]  Patient Care Team: Gwenlyn Fudge, FNP as PCP - General (Family Medicine)   Chief Complaint:  Chief Complaint  Patient presents with  . pain contract    4 week follow up    HPI: Tammy Mayo is a 54 y.o. female presenting on 12/17/2019 for pain contract (4 week follow up)  [facility] Controlled Substance Abuse database reviewed- Yes   If yes- were their any concerning findings: No  Depression screen [facility] 2/9 12/20/2019 11/18/2019  Decreased Interest 2 3  Down, Depressed, Hopeless 1 2  PHQ - 2 Score 3 5  Altered sleeping 1 1   Tired, decreased energy 3 2  Change in appetite 1 2  Feeling bad or failure about yourself  3 3  Trouble concentrating 2 3  Moving slowly or fidgety/restless 1 1  Suicidal thoughts 3 2  PHQ-9 Score 17 19  Difficult doing work/chores Somewhat difficult -   GAD 7 : Generalized Anxiety Score 12/20/2019 11/18/2019  Nervous, Anxious, on Edge 2 2  Control/stop worrying 3 3  Worry too much - different things 1 3  Trouble relaxing 1 2  Restless 2 2  Easily annoyed or irritable 2 3  Afraid - awful might happen 3 2  Total GAD 7 Score 14 17  Anxiety Difficulty Somewhat difficult -    [employer] drug screen performed- Yes  SOAPP  0= never  1= seldom  2=sometimes  3= often  4= very often  How often do you have mood swings? 3 How often do you smoke a cigarette within an hour after waking up? 0 How often have you taken medication other than the way that it was prescribed? 0 How often have you used illegal drugs in the past 5 years? 0 How often, in your lifetime, have you had legal problems or been arrested? 0  Score 3  Alcohol Audit - How often during the last year have found that you: 0-Never   1- Less than monthly   2- Monthly     3-Weekly     4-daily or almost daily  - found that you were not able to stop drinking once  you started- 0 -failed to do what was normally expected of you because of drinking- 0 -needed a first drink in the morning- 0 -had a feeling of guilt or remorse after drinking- 0 -are/were unable to remember what happened the night before because of your drinking- 0  0- NO   2- yes but not in last year  4- yes during last year -Have you or someone else been injured because of your drinking- 0 - Has anyone been concerned about your drinking or suggested you cut down- 0        TOTAL- 0  ( 0-7- alcohol education, 8-15- simple advice, 16-19 simple advice plus counseling, 20-40 referral for evaluation and treatment )  Opioid Risk  12/17/2019  Alcohol 3  Illegal Drugs  0  Rx Drugs 0  Alcohol 0  Illegal Drugs 0  Rx Drugs 0  Age between 16-45 years  0  History of Preadolescent Sexual Abuse 0  Psychological Disease 0  Depression 1  Opioid Risk Tool Scoring 4  Opioid Risk Interpretation Moderate Risk    Designated Pharmacy- [facility] [place]  Pain assessment: Cause of pain- lumbar fusion at L4-L5 with screws and a spacer, rotator cuff disease Pain location- right shoulder, low back Pain on scale of 1-10- 5/10 with medication, 9/10 without medication Frequency- daily What increases pain- house chores, bending What makes pain better- Biofreeze, rest, Tramadol Effects on ADL - makes difficult  Prior treatments tried and failed- Tylenol, Ibuprofen, Tramadol Current opioids rx- Norco 5/325 QD PRN # prescribed- 30 Morphine mg equivalent- 5 MME/day  Pain management agreement reviewed and signed- Yes   Patient is wanting to know if she can come off the Ozempic since her A1c was 6.3.   Social history:  Relevant past medical, surgical, family and social history reviewed and updated as indicated. Interim medical history since our last visit reviewed.  Allergies and medications reviewed and updated.  DATA REVIEWED: CHART IN EPIC  ROS: Negative unless specifically indicated above in HPI.    Current Outpatient Medications:  .  ARIPiprazole (ABILIFY) 10 MG tablet, Take 10 mg by mouth daily., Disp: , Rfl:  .  Biotin 1000 MCG tablet, Take 1,000 mcg by mouth daily., Disp: , Rfl:  .  cetirizine (ZYRTEC) 10 MG tablet, Take 10 mg by mouth daily., Disp: , Rfl:  .  cholecalciferol (VITAMIN D) 1000 UNITS tablet, Take 1,000 Units by mouth daily., Disp: , Rfl:  .  Dapagliflozin-Metformin HCl ER (XIGDUO XR) 04-999 MG TB24, Take 1 tablet by mouth daily with supper., Disp: , Rfl:  .  docusate sodium (COLACE) 100 MG capsule, Take 100 mg by mouth daily., Disp: , Rfl:  .  DULoxetine (CYMBALTA) 30 MG capsule, Take 30 mg by mouth 2 (two) times daily.,  Disp: , Rfl:  .  estradiol (ESTRACE) 1 MG tablet, Take 1 mg by mouth daily., Disp: , Rfl:  .  gabapentin (NEURONTIN) 300 MG capsule, Take 300 mg by mouth 2 (two) times daily. , Disp: , Rfl:  .  lisinopril (ZESTRIL) 20 MG tablet, Take 20 mg by mouth daily., Disp: , Rfl:  .  medroxyPROGESTERone (PROVERA) 2.5 MG tablet, Take 2.5 mg by mouth daily., Disp: , Rfl:  .  omeprazole (PRILOSEC) 20 MG capsule, Take 1 capsule (20 mg total) by mouth daily., Disp: 14 capsule, Rfl: 0 .  oxymetazoline (AFRIN) 0.05 % nasal spray, Place 1 spray into both nostrils 2 (two) times daily as needed for congestion (over the  counter nasal spray)., Disp: , Rfl:  .  OZEMPIC, 1 MG/DOSE, 2 MG/1.5ML SOPN, Inject 1 mg into the skin once a week., Disp: , Rfl:  .  polyethylene glycol (MIRALAX / GLYCOLAX) 17 g packet, Take 17 g by mouth daily., Disp: 14 each, Rfl: 5 .  promethazine (PHENERGAN) 25 MG tablet, Take 25 mg by mouth every 6 (six) hours as needed. , Disp: , Rfl:  .  rosuvastatin (CRESTOR) 10 MG tablet, Take 10 mg by mouth daily., Disp: , Rfl:  .  vitamin B-12 (CYANOCOBALAMIN) 500 MCG tablet, Take 500 mcg by mouth daily., Disp: , Rfl:  .  HYDROcodone-acetaminophen (NORCO) 5-325 MG tablet, Take 1 tablet by mouth daily as needed for moderate pain., Disp: 30 tablet, Rfl: 0 .  HYDROcodone-acetaminophen (NORCO) 5-325 MG tablet, Take 1 tablet by mouth daily as needed for moderate pain., Disp: 30 tablet, Rfl: 0 .  HYDROcodone-acetaminophen (NORCO) 5-325 MG tablet, Take 1 tablet by mouth daily as needed for moderate pain., Disp: 30 tablet, Rfl: 0   No Known Allergies Past Medical History:  Diagnosis Date  . Anemia   . Arthritis   . Chronic back pain    lumbar 4-5 slip and radicular right leg pain  . Depression   . Diabetes mellitus without complication (HCC)   . GERD (gastroesophageal reflux disease)   . Hyperlipidemia   . Hypertension   . Seasonal allergies   . Vitamin D deficiency   . Wears glasses     Past Surgical  History:  Procedure Laterality Date  . BACK SURGERY    . CESAREAN SECTION     x 1  . CHOLECYSTECTOMY N/A 11/13/2015   Procedure: LAPAROSCOPIC CHOLECYSTECTOMY;  Surgeon: Emelia Loron, MD;  Location[facility];  Service: General;  Laterality: N/A;  . DILATION AND CURETTAGE OF UTERUS    . HERNIA REPAIR     umbilical hernia repair as a child  . TUBAL LIGATION      Social History   Socioeconomic History  . Marital status: Married    Spouse name: Not on file  . Number of children: Not on file  . Years of education: Not on file  . Highest education level: Not on file  Occupational History  . Not on file  Tobacco Use  . Smoking status: Never Smoker  . Smokeless tobacco: Never Used  Substance and Sexual Activity  . Alcohol use: No  . Drug use: No  . Sexual activity: Yes    Birth control/protection: None  Other Topics Concern  . Not on file  Social History Narrative  . Not on file   Social Determinants of Health   Financial Resource Strain:   . Difficulty of Paying Living Expenses:   Food Insecurity:   . Worried About Programme researcher, broadcasting/film/video in the Last Year:   . Barista in the Last Year:   Transportation Needs:   . Freight forwarder (Medical):   Marland Kitchen Lack of Transportation (Non-Medical):   Physical Activity:   . Days of Exercise per Week:   . Minutes of Exercise per Session:   Stress:   . Feeling of Stress :   Social Connections:   . Frequency of Communication with Friends and Family:   . Frequency of Social Gatherings with Friends and Family:   . Attends Religious Services:   . Active Member of Clubs or Organizations:   . Attends Banker Meetings:   Marland Kitchen Marital Status:   Intimate Programme researcher, broadcasting/film/video  Violence:   . Fear of Current or Ex-Partner:   . Emotionally Abused:   Marland Kitchen Physically Abused:   . Sexually Abused:         Objective:    BP 120/86   Pulse 88   Temp (!) 97.5 F (36.4 C) (Temporal)   Ht 5\' 4"  (1.626 m)   Wt 181 lb 3.2 oz (82.2 kg)   LMP   (LMP Unknown)   SpO2 99%   BMI 31.10 kg/m   Wt Readings from Last 3 Encounters:  12/17/19 181 lb 3.2 oz (82.2 kg)  12/09/19 183 lb (83 kg)  11/18/19 188 lb 9.6 oz (85.5 kg)    Physical Exam Vitals reviewed.  Constitutional:      General: She is not in acute distress.    Appearance: Normal appearance. She is obese. She is not ill-appearing, toxic-appearing or diaphoretic.  HENT:     Head: Normocephalic and atraumatic.  Eyes:     General: No scleral icterus.       Right eye: No discharge.        Left eye: No discharge.     Conjunctiva/sclera: Conjunctivae normal.  Cardiovascular:     Rate and Rhythm: Normal rate and regular rhythm.     Heart sounds: Normal heart sounds. No murmur. No friction rub. No gallop.   Pulmonary:     Effort: Pulmonary effort is normal. No respiratory distress.     Breath sounds: Normal breath sounds. No stridor. No wheezing, rhonchi or rales.  Musculoskeletal:        General: Normal range of motion.     Cervical back: Normal range of motion.  Skin:    General: Skin is warm and dry.     Capillary Refill: Capillary refill takes less than 2 seconds.  Neurological:     General: No focal deficit present.     Mental Status: She is alert and oriented to person, place, and time. Mental status is at baseline.  Psychiatric:        Mood and Affect: Mood normal.        Behavior: Behavior normal.        Thought Content: Thought content normal.        Judgment: Judgment normal.     No results found for: TSH Lab Results  Component Value Date   WBC 6.1 11/18/2019   HGB 13.6 11/18/2019   HCT 40.7 11/18/2019   MCV 88 11/18/2019   PLT 318 11/18/2019   Lab Results  Component Value Date   NA 147 (H) 11/18/2019   K 3.8 11/18/2019   CO2 26 11/18/2019   GLUCOSE 84 11/18/2019   BUN 4 (L) 11/18/2019   CREATININE 0.76 11/18/2019   BILITOT 0.3 11/18/2019   ALKPHOS 92 11/18/2019   AST 16 11/18/2019   ALT 13 11/18/2019   PROT 6.8 11/18/2019   ALBUMIN 4.4  11/18/2019   CALCIUM 9.5 11/18/2019   ANIONGAP 12 07/15/2017   Lab Results  Component Value Date   CHOL 131 11/18/2019   Lab Results  Component Value Date   HDL 44 11/18/2019   Lab Results  Component Value Date   LDLCALC 73 11/18/2019   Lab Results  Component Value Date   TRIG 67 11/18/2019   Lab Results  Component Value Date   CHOLHDL 3.0 11/18/2019   Lab Results  Component Value Date   HGBA1C 6.3 11/18/2019

## 2019-12-20 ENCOUNTER — Encounter: Payer: Self-pay | Admitting: Family Medicine

## 2019-12-21 ENCOUNTER — Other Ambulatory Visit: Payer: Self-pay

## 2019-12-21 ENCOUNTER — Ambulatory Visit: Payer: Managed Care, Other (non HMO) | Admitting: *Deleted

## 2019-12-21 DIAGNOSIS — M25511 Pain in right shoulder: Secondary | ICD-10-CM

## 2019-12-21 DIAGNOSIS — M6281 Muscle weakness (generalized): Secondary | ICD-10-CM

## 2019-12-21 DIAGNOSIS — M25611 Stiffness of right shoulder, not elsewhere classified: Secondary | ICD-10-CM

## 2019-12-21 NOTE — Therapy (Signed)
[facility] [place], [place], [address] Phone: [phone]   Fax:  [phone]  Physical Therapy Treatment  Patient Details  Name: JESSY CALIXTE MRN: [id number] Date of Birth: 28-Nov-1965 Referring Provider (PT): Sanjuana Kava MD   Encounter Date: 12/21/2019  PT End of Session - 12/21/19 1629    Visit Number  2    Number of Visits  12    Date for PT Re-Evaluation  01/26/20    PT Start Time  2778    PT Stop Time  1431    PT Time Calculation (min)  46 min       Past Medical History:  Diagnosis Date  . Anemia   . Arthritis   . Chronic back pain    lumbar 4-5 slip and radicular right leg pain  . Depression   . Diabetes mellitus without complication ([place])   . GERD (gastroesophageal reflux disease)   . Hyperlipidemia   . Hypertension   . Seasonal allergies   . Vitamin D deficiency   . Wears glasses     Past Surgical History:  Procedure Laterality Date  . BACK SURGERY    . CESAREAN SECTION     x 1  . CHOLECYSTECTOMY N/A 11/13/2015   Procedure: LAPAROSCOPIC CHOLECYSTECTOMY;  Surgeon: Rolm Bookbinder, MD;  Location: [facility];  Service: General;  Laterality: N/A;  . DILATION AND CURETTAGE OF UTERUS    . HERNIA REPAIR     umbilical hernia repair as a child  . TUBAL LIGATION      There were no vitals filed for this visit.  Subjective Assessment - 12/21/19 1348    Subjective  COVID-19 screen performed prior to patient entering clinic. RT shldr is sore    Pertinent History  DM, HTN, lumbar surgery.    Diagnostic tests  X-ray.    Patient Stated Goals  Use right arm without pain.  Sleep on right side.    Currently in Pain?  Yes    Pain Score  5     Pain Location  Shoulder    Pain Orientation  Right    Pain Descriptors / Indicators  Aching;Sore                        [facility] Adult PT Treatment/Exercise - 12/21/19 0001      Exercises   Exercises  Shoulder      Shoulder Exercises: Supine   Other Supine Exercises  cane flexion 2x 10      Shoulder Exercises: Pulleys   Flexion  5 minutes    Other Pulley Exercises  UE ranger in sitting x 5 mins flex/ ext, and circles each way x 5 mins. Try in standing       Modalities   Modalities  Electrical Stimulation;Moist Heat      Electrical Stimulation   Electrical Stimulation Location  Right shoulder.    Electrical Stimulation Action  IFC x 15 mins 80-150hz     Electrical Stimulation Goals  Pain      Manual Therapy   Manual Therapy  Passive ROM    Passive ROM  PROM to RT shldr with Pt supine. Passive flexion, abduction, IR, and ER with end-range holds                  PT Long Term Goals - 12/15/19 1411      PT LONG TERM GOAL #1   Title  Independent with a HEP.  Time  6    Period  Weeks    Status  New      PT LONG TERM GOAL #2   Title  Active right shoulder flexion to 145 degrees so the patient can easily reach overhead.    Time  6    Period  Weeks    Status  New      PT LONG TERM GOAL #3   Title  Active ER to 70 degrees+ to allow for easily donning/doffing of apparel.    Time  6    Period  Weeks    Status  New      PT LONG TERM GOAL #4   Title  Increase ROM so patient is able to reach behind back to L3.    Time  6    Period  Weeks    Status  New      PT LONG TERM GOAL #5   Title  Increase right shoulder strength to a solid 4+/5 to increase stability for performance of functional activities.    Time  6    Period  Weeks    Status  New      PT LONG TERM GOAL #6   Title  Perform ADL's with pain not > 3/10.    Time  6    Period  Weeks    Status  New            Plan - 12/21/19 1357    Clinical Impression Statement  Pt arrived today with RT shldr tightness and soreness. She was able to perform some AAROM exs f/b passive stretching in all planes supine position with end-range holds. Normal moddality response with decreased pain after Rx.    Personal Factors and Comorbidities  Comorbidity  1;Comorbidity 2    Comorbidities  DM, HTN, lumbar surgery.    Examination-Activity Limitations  Reach Overhead;Other    Examination-Participation Restrictions  Other    Stability/Clinical Decision Making  Evolving/Moderate complexity    Rehab Potential  Good    PT Duration  6 weeks    PT Treatment/Interventions  ADLs/Self Care Home Management;Cryotherapy;Electrical Stimulation;Ultrasound;Moist Heat;Therapeutic activities;Therapeutic exercise;Manual techniques;Patient/family education;Passive range of motion;Dry needling;Joint Manipulations;Vasopneumatic Device    PT Next Visit Plan  Pulleys, UE Ranger, right shoulder PROM and capsular stretching.  Progress to PRE's.  Modalities PRN.    Consulted and Agree with Plan of Care  Patient       Patient will benefit from skilled therapeutic intervention in order to improve the following deficits and impairments:  Pain, Decreased activity tolerance, Decreased strength, Decreased range of motion  Visit Diagnosis: Chronic right shoulder pain  Stiffness of right shoulder, not elsewhere classified  Muscle weakness (generalized)     Problem List Patient Active Problem List   Diagnosis Date Noted  . Chronic right shoulder pain 11/22/2019  . Constipation 11/22/2019  . Type 2 diabetes mellitus with diabetic neuropathy, with long-term current use of insulin (HCC) 11/22/2019  . Back pain 06/21/2015  . HTN (hypertension) 10/23/2010  . Vitamin D deficiency 10/23/2010  . Hyperlipemia 10/23/2010  . Plantar fasciitis 10/23/2010    Tyshay Adee,CHRIS, PTA 12/21/2019, 4:32 PM  [facility] [address] [place], [place], [address] Phone: [phone]   Fax:  [phone]  Name: ALNISA HASLEY MRN: [id number] Date of Birth: [birth year]

## 2019-12-23 ENCOUNTER — Telehealth: Payer: Self-pay | Admitting: Family Medicine

## 2019-12-23 NOTE — Telephone Encounter (Signed)
Patient aware and verbalizes understanding. 

## 2019-12-23 NOTE — Telephone Encounter (Signed)
Please call patient and let her know I did hear back from the OB/GYN and she does not have to wean off the estradiol and Provera.

## 2019-12-29 ENCOUNTER — Other Ambulatory Visit: Payer: Self-pay

## 2019-12-29 ENCOUNTER — Encounter: Payer: Self-pay | Admitting: Physical Therapy

## 2019-12-29 ENCOUNTER — Ambulatory Visit: Payer: Managed Care, Other (non HMO) | Attending: Orthopaedic Surgery | Admitting: Physical Therapy

## 2019-12-29 DIAGNOSIS — M25511 Pain in right shoulder: Secondary | ICD-10-CM | POA: Insufficient documentation

## 2019-12-29 DIAGNOSIS — M25611 Stiffness of right shoulder, not elsewhere classified: Secondary | ICD-10-CM | POA: Insufficient documentation

## 2019-12-29 DIAGNOSIS — M6281 Muscle weakness (generalized): Secondary | ICD-10-CM | POA: Diagnosis present

## 2019-12-29 DIAGNOSIS — G8929 Other chronic pain: Secondary | ICD-10-CM | POA: Insufficient documentation

## 2019-12-29 NOTE — Therapy (Signed)
[facility] [place], [place], [address] Phone: [phone]   Fax:  [phone]  Physical Therapy Treatment  Patient Details  Name: Tammy Mayo MRN: [id number] Date of Birth: May 28, 1966 Referring Provider (PT): Sanjuana Kava MD   Encounter Date: 12/29/2019  PT End of Session - 12/29/19 1346    Visit Number  3    Number of Visits  12    Date for PT Re-Evaluation  01/26/20    PT Start Time  7322    PT Stop Time  1430    PT Time Calculation (min)  45 min    Activity Tolerance  Patient tolerated treatment well    Behavior During Therapy  [facility] for tasks assessed/performed       Past Medical History:  Diagnosis Date  . Anemia   . Arthritis   . Chronic back pain    lumbar 4-5 slip and radicular right leg pain  . Depression   . Diabetes mellitus without complication ([place])   . GERD (gastroesophageal reflux disease)   . Hyperlipidemia   . Hypertension   . Seasonal allergies   . Vitamin D deficiency   . Wears glasses     Past Surgical History:  Procedure Laterality Date  . BACK SURGERY    . CESAREAN SECTION     x 1  . CHOLECYSTECTOMY N/A 11/13/2015   Procedure: LAPAROSCOPIC CHOLECYSTECTOMY;  Surgeon: Rolm Bookbinder, MD;  Location: [facility];  Service: General;  Laterality: N/A;  . DILATION AND CURETTAGE OF UTERUS    . HERNIA REPAIR     umbilical hernia repair as a child  . TUBAL LIGATION      There were no vitals filed for this visit.  Subjective Assessment - 12/29/19 1344    Subjective  COVID-19 screen performed prior to patient entering clinic. Pt reporting R shoulder pain is 4.5/10 today. Pt reports she is sleeping a "little better" at night.    Pertinent History  DM, HTN, lumbar surgery.    Diagnostic tests  X-ray.    Patient Stated Goals  Use right arm without pain.  Sleep on right side.    Currently in Pain?  Yes    Pain Score  5     Pain Location  Shoulder    Pain Orientation  Right    Pain  Descriptors / Indicators  Aching;Sore    Pain Type  Chronic pain    Pain Onset  More than a month ago                        [facility] Adult PT Treatment/Exercise - 12/29/19 0001      Exercises   Exercises  Shoulder      Shoulder Exercises: Supine   Other Supine Exercises  cane flexion 2x 10      Shoulder Exercises: Standing   Row  AROM;Strengthening;Both;15 reps;Theraband    Other Standing Exercises  wall wash large circles 10 each direction      Shoulder Exercises: Pulleys   Flexion  5 minutes    Other Pulley Exercises  UE ranger standing, flexion, small circles      Modalities   Modalities  Electrical Stimulation;Moist Heat      Electrical Stimulation   Electrical Stimulation Location  Right shoulder.    Electrical Stimulation Action  IFCx 15 minutes, intensity to tolerance    Electrical Stimulation Goals  Pain      Manual Therapy   Manual  Therapy  Soft tissue mobilization;Passive ROM    Manual therapy comments  10 minutes total    Soft tissue mobilization  STM to R bicep, and lateral R shoulder using [place] gel    Passive ROM  PROM to RT shldr with Pt supine. Passive flexion, abduction, IR, and ER with end-range holds                  PT Long Term Goals - 12/29/19 1432      PT LONG TERM GOAL #1   Title  Independent with a HEP.    Status  On-going      PT LONG TERM GOAL #2   Title  Active right shoulder flexion to 145 degrees so the patient can easily reach overhead.    Status  On-going      PT LONG TERM GOAL #3   Title  Active ER to 70 degrees+ to allow for easily donning/doffing of apparel.    Status  On-going      PT LONG TERM GOAL #4   Title  Increase ROM so patient is able to reach behind back to L3.    Status  On-going      PT LONG TERM GOAL #5   Title  Increase right shoulder strength to a solid 4+/5 to increase stability for performance of functional activities.    Status  On-going      PT LONG TERM GOAL #6   Title  Perform  ADL's with pain not > 3/10.    Status  On-going            Plan - 12/29/19 1428    Clinical Impression Statement  Pt arriving to therpay reporting 4.5/10 pain in her R shoulder. Pt tolerating more standing and Active and AAROM exercises and Passive stretching. STM and PROM performed pt reporting less pain at end of session. Continue skilled PT.    Personal Factors and Comorbidities  Comorbidity 1;Comorbidity 2    Comorbidities  DM, HTN, lumbar surgery.    Examination-Activity Limitations  Reach Overhead;Other    Stability/Clinical Decision Making  Evolving/Moderate complexity    Rehab Potential  Good    PT Duration  6 weeks    PT Treatment/Interventions  ADLs/Self Care Home Management;Cryotherapy;Electrical Stimulation;Ultrasound;Moist Heat;Therapeutic activities;Therapeutic exercise;Manual techniques;Patient/family education;Passive range of motion;Dry needling;Joint Manipulations;Vasopneumatic Device    PT Next Visit Plan  Pulleys, UE Ranger, right shoulder PROM and capsular stretching.  Progress to PRE's.  Modalities PRN.    Consulted and Agree with Plan of Care  Patient       Patient will benefit from skilled therapeutic intervention in order to improve the following deficits and impairments:  Pain, Decreased activity tolerance, Decreased strength, Decreased range of motion  Visit Diagnosis: Chronic right shoulder pain  Stiffness of right shoulder, not elsewhere classified  Muscle weakness (generalized)     Problem List Patient Active Problem List   Diagnosis Date Noted  . Chronic right shoulder pain 11/22/2019  . Constipation 11/22/2019  . Type 2 diabetes mellitus with diabetic neuropathy, with long-term current use of insulin (HCC) 11/22/2019  . Back pain 06/21/2015  . HTN (hypertension) 10/23/2010  . Vitamin D deficiency 10/23/2010  . Hyperlipemia 10/23/2010  . Plantar fasciitis 10/23/2010    Sharmon Leyden, PT, MPT 12/29/2019, 2:33 PM  [facility] [address] [place], [place], [address] Phone: [phone]   Fax:  [phone]  Name: KATELYNNE REVAK MRN: [id number] Date of Birth: [birth year]

## 2019-12-31 ENCOUNTER — Other Ambulatory Visit: Payer: Self-pay

## 2019-12-31 ENCOUNTER — Encounter: Payer: Self-pay | Admitting: Physical Therapy

## 2019-12-31 ENCOUNTER — Ambulatory Visit: Payer: Managed Care, Other (non HMO) | Admitting: Physical Therapy

## 2019-12-31 DIAGNOSIS — M25611 Stiffness of right shoulder, not elsewhere classified: Secondary | ICD-10-CM

## 2019-12-31 DIAGNOSIS — M25511 Pain in right shoulder: Secondary | ICD-10-CM | POA: Diagnosis not present

## 2019-12-31 DIAGNOSIS — M6281 Muscle weakness (generalized): Secondary | ICD-10-CM

## 2019-12-31 NOTE — Therapy (Signed)
[facility] [address] [place], [place], [address] Phone: [phone]   Fax:  [phone]  Physical Therapy Treatment  Patient Details  Name: CARIME DINKEL MRN: [id number] Date of Birth: 1965-10-24 Referring Provider (PT): Darreld Mclean MD   Encounter Date: 12/31/2019  PT End of Session - 12/31/19 1220    Visit Number  4    Number of Visits  12    Date for PT Re-Evaluation  01/26/20    PT Start Time  1115    PT Stop Time  1210    PT Time Calculation (min)  55 min    Activity Tolerance  Patient tolerated treatment well    Behavior During Therapy  [facility] for tasks assessed/performed       Past Medical History:  Diagnosis Date  . Anemia   . Arthritis   . Chronic back pain    lumbar 4-5 slip and radicular right leg pain  . Depression   . Diabetes mellitus without complication (HCC)   . GERD (gastroesophageal reflux disease)   . Hyperlipidemia   . Hypertension   . Seasonal allergies   . Vitamin D deficiency   . Wears glasses     Past Surgical History:  Procedure Laterality Date  . BACK SURGERY    . CESAREAN SECTION     x 1  . CHOLECYSTECTOMY N/A 11/13/2015   Procedure: LAPAROSCOPIC CHOLECYSTECTOMY;  Surgeon: Emelia Loron, MD;  Location: [facility] OR;  Service: General;  Laterality: N/A;  . DILATION AND CURETTAGE OF UTERUS    . HERNIA REPAIR     umbilical hernia repair as a child  . TUBAL LIGATION      There were no vitals filed for this visit.  Subjective Assessment - 12/31/19 1119    Subjective  COVID-19 screen performed prior to patient entering clinic. Pt reporting R shoulder pain is 4.5/10, reports improvements but still with difficulties with behind the back activities.    Pertinent History  DM, HTN, lumbar surgery.    Diagnostic tests  X-ray.    Patient Stated Goals  Use right arm without pain.  Sleep on right side.    Currently in Pain?  Yes    Pain Score  5     Pain Location  Shoulder    Pain Orientation   Right    Pain Descriptors / Indicators  Aching;Sore    Pain Type  Chronic pain    Pain Onset  More than a month ago    Pain Frequency  Constant         [facility] PT Assessment - 12/31/19 0001      Assessment   Medical Diagnosis  Chronic right shoulder pain.    Referring Provider (PT)  Darreld Mclean MD                    [facility] Adult PT Treatment/Exercise - 12/31/19 0001      Exercises   Exercises  Shoulder      Shoulder Exercises: Supine   Other Supine Exercises  --      Shoulder Exercises: Standing   Other Standing Exercises  wall ladder x10 to 25      Shoulder Exercises: Pulleys   Flexion  5 minutes    Other Pulley Exercises  UE ranger sitting flexion, CW, CCW 2 min each      Modalities   Modalities  Electrical Stimulation;Moist Heat      Moist Heat Therapy   Number Minutes Moist  Heat  10 Minutes    Moist Heat Location  Shoulder      Electrical Stimulation   Electrical Stimulation Location  Right shoulder.    Electrical Stimulation Action  IFC    Electrical Stimulation Parameters  80-150 hz x10 mins    Electrical Stimulation Goals  Pain      Manual Therapy   Manual Therapy  Soft tissue mobilization;Passive ROM    Passive ROM  PROM to RT shldr with Pt supine. Passive flexion, abduction, IR, and ER with end-range holds                  PT Long Term Goals - 12/29/19 1432      PT LONG TERM GOAL #1   Title  Independent with a HEP.    Status  On-going      PT LONG TERM GOAL #2   Title  Active right shoulder flexion to 145 degrees so the patient can easily reach overhead.    Status  On-going      PT LONG TERM GOAL #3   Title  Active ER to 70 degrees+ to allow for easily donning/doffing of apparel.    Status  On-going      PT LONG TERM GOAL #4   Title  Increase ROM so patient is able to reach behind back to L3.    Status  On-going      PT LONG TERM GOAL #5   Title  Increase right shoulder strength to a solid 4+/5 to increase stability  for performance of functional activities.    Status  On-going      PT LONG TERM GOAL #6   Title  Perform ADL's with pain not > 3/10.    Status  On-going            Plan - 12/31/19 1220    Clinical Impression Statement  Patient responded well to therapy session with minimal reports of increased pain. Patient required tactile cuing for shoulder alignment to prevent rotation during ladder to which she responded well to cue. PROM performed with smooth arcs of motion and minimal reports of pain at end range. No adverse affects upon removal of modalities.    Personal Factors and Comorbidities  Comorbidity 1;Comorbidity 2    Comorbidities  DM, HTN, lumbar surgery.    Examination-Activity Limitations  Reach Overhead;Other    Examination-Participation Restrictions  Other    Stability/Clinical Decision Making  Evolving/Moderate complexity    Clinical Decision Making  Low    Rehab Potential  Good    PT Duration  6 weeks    PT Treatment/Interventions  ADLs/Self Care Home Management;Cryotherapy;Electrical Stimulation;Ultrasound;Moist Heat;Therapeutic activities;Therapeutic exercise;Manual techniques;Patient/family education;Passive range of motion;Dry needling;Joint Manipulations;Vasopneumatic Device    PT Next Visit Plan  Pulleys, UE Ranger, right shoulder PROM and capsular stretching.  Progress to PRE's.  Modalities PRN.    Consulted and Agree with Plan of Care  Patient       Patient will benefit from skilled therapeutic intervention in order to improve the following deficits and impairments:  Pain, Decreased activity tolerance, Decreased strength, Decreased range of motion  Visit Diagnosis: Chronic right shoulder pain  Stiffness of right shoulder, not elsewhere classified  Muscle weakness (generalized)     Problem List Patient Active Problem List   Diagnosis Date Noted  . Chronic right shoulder pain 11/22/2019  . Constipation 11/22/2019  . Type 2 diabetes mellitus with diabetic  neuropathy, with long-term current use of insulin (HCC) 11/22/2019  .  Back pain 06/21/2015  . HTN (hypertension) 10/23/2010  . Vitamin D deficiency 10/23/2010  . Hyperlipemia 10/23/2010  . Plantar fasciitis 10/23/2010    Gabriela Eves, PT, DPT 12/31/2019, 12:27 PM  [facility] [address] [place], [place], [address] Phone: [phone]   Fax:  [phone]  Name: STAMATIA MASRI MRN: [id number] Date of Birth: 1966/06/15

## 2020-01-04 ENCOUNTER — Encounter: Payer: Self-pay | Admitting: Orthopaedic Surgery

## 2020-01-04 ENCOUNTER — Other Ambulatory Visit: Payer: Self-pay

## 2020-01-04 ENCOUNTER — Ambulatory Visit (INDEPENDENT_AMBULATORY_CARE_PROVIDER_SITE_OTHER): Payer: Managed Care, Other (non HMO) | Admitting: Orthopaedic Surgery

## 2020-01-04 VITALS — Ht 64.0 in | Wt 180.0 lb

## 2020-01-04 DIAGNOSIS — G8929 Other chronic pain: Secondary | ICD-10-CM

## 2020-01-04 DIAGNOSIS — M25511 Pain in right shoulder: Secondary | ICD-10-CM | POA: Diagnosis not present

## 2020-01-04 NOTE — Progress Notes (Signed)
Patient WE:[id number] Tammy Mayo, female DOB:23-May-1966, 54 y.o. YPP:[id number]  Chief Complaint  Patient presents with  . Shoulder Pain    right/ better with therapy     HPI  Tammy Mayo is Tammy 54 y.o. female who has right shoulder pain.  She is going to PT in [place].  I have read their notes.  She is slowly getting better but still has pain.  She has no paresthesias.  She has no new trauma.   Body mass index is 30.9 kg/m.  ROS  Review of Systems  Constitutional: Positive for activity change.  Musculoskeletal: Positive for arthralgias.  Allergic/Immunologic: Positive for environmental allergies.  All other systems reviewed and are negative.   All other systems reviewed and are negative.  The following is Tammy summary of the past history medically, past history surgically, known current medicines, social history and family history.  This information is gathered electronically by the computer from prior information and documentation.  I review this each visit and have found including this information at this point in the chart is beneficial and informative.    Past Medical History:  Diagnosis Date  . Anemia   . Arthritis   . Chronic back pain    lumbar 4-5 slip and radicular right leg pain  . Depression   . Diabetes mellitus without complication (HCC)   . GERD (gastroesophageal reflux disease)   . Hyperlipidemia   . Hypertension   . Seasonal allergies   . Vitamin D deficiency   . Wears glasses     Past Surgical History:  Procedure Laterality Date  . BACK SURGERY    . CESAREAN SECTION     x 1  . CHOLECYSTECTOMY N/Tammy 11/13/2015   Procedure: LAPAROSCOPIC CHOLECYSTECTOMY;  Surgeon: Emelia Loron, MD;  Location: [facility] OR;  Service: General;  Laterality: N/Tammy;  . DILATION AND CURETTAGE OF UTERUS    . HERNIA REPAIR     umbilical hernia repair as Tammy child  . TUBAL LIGATION      Family History  Problem Relation Age of Onset  . Stroke Mother   . Hypertension Mother   .  Diabetes Mother   . Hypertension Father   . Diabetes Father   . Kidney disease Father   . Hypertension Sister     Social History Social History   Tobacco Use  . Smoking status: Never Smoker  . Smokeless tobacco: Never Used  Substance Use Topics  . Alcohol use: No  . Drug use: No    No Known Allergies  Current Outpatient Medications  Medication Sig Dispense Refill  . ARIPiprazole (ABILIFY) 10 MG tablet Take 10 mg by mouth daily.    . Biotin 1000 MCG tablet Take 1,000 mcg by mouth daily.    . cetirizine (ZYRTEC) 10 MG tablet Take 10 mg by mouth daily.    . cholecalciferol (VITAMIN D) 1000 UNITS tablet Take 1,000 Units by mouth daily.    . Dapagliflozin-Metformin HCl ER (XIGDUO XR) 04-999 MG TB24 Take 1 tablet by mouth daily with supper.    . docusate sodium (COLACE) 100 MG capsule Take 100 mg by mouth daily.    . DULoxetine (CYMBALTA) 30 MG capsule Take 30 mg by mouth 2 (two) times daily.    Marland Kitchen gabapentin (NEURONTIN) 300 MG capsule Take 300 mg by mouth 2 (two) times daily.     Marland Kitchen HYDROcodone-acetaminophen (NORCO) 5-325 MG tablet Take 1 tablet by mouth daily as needed for moderate pain. 30 tablet 0  . lisinopril (  ZESTRIL) 20 MG tablet Take 20 mg by mouth daily.    Marland Kitchen omeprazole (PRILOSEC) 20 MG capsule Take 1 capsule (20 mg total) by mouth daily. 14 capsule 0  . oxymetazoline (AFRIN) 0.05 % nasal spray Place 1 spray into both nostrils 2 (two) times daily as needed for congestion (over the counter nasal spray).    Marland Kitchen OZEMPIC, 1 MG/DOSE, 2 MG/1.5ML SOPN Inject 1 mg into the skin once Tammy week.    . polyethylene glycol (MIRALAX / GLYCOLAX) 17 g packet Take 17 g by mouth daily. 14 each 5  . promethazine (PHENERGAN) 25 MG tablet Take 25 mg by mouth every 6 (six) hours as needed.     . rosuvastatin (CRESTOR) 10 MG tablet Take 10 mg by mouth daily.    . traMADol (ULTRAM) 50 MG tablet Take 50 mg by mouth 3 (three) times daily as needed.    . traZODone (DESYREL) 50 MG tablet Take 50 mg by mouth  at bedtime.    Marland Kitchen estradiol (ESTRACE) 1 MG tablet Take 1 mg by mouth daily.    Marland Kitchen HYDROcodone-acetaminophen (NORCO) 5-325 MG tablet Take 1 tablet by mouth daily as needed for moderate pain. (Patient not taking: Reported on 01/04/2020) 30 tablet 0  . HYDROcodone-acetaminophen (NORCO) 5-325 MG tablet Take 1 tablet by mouth daily as needed for moderate pain. (Patient not taking: Reported on 01/04/2020) 30 tablet 0  . medroxyPROGESTERone (PROVERA) 2.5 MG tablet Take 2.5 mg by mouth daily.     No current facility-administered medications for this visit.     Physical Exam  Height 5\' 4"  (1.626 m), weight 180 lb (81.6 kg).  Constitutional: overall normal hygiene, normal nutrition, well developed, normal grooming, normal body habitus. Assistive device:none  Musculoskeletal: gait and station Limp none, muscle tone and strength are normal, no tremors or atrophy is present.  .  Neurological: coordination overall normal.  Deep tendon reflex/nerve stretch intact.  Sensation normal.  Cranial nerves II-XII intact.   Skin:   Normal overall no scars, lesions, ulcers or rashes. No psoriasis.  Psychiatric: Alert and oriented x 3.  Recent memory intact, remote memory unclear.  Normal mood and affect. Well groomed.  Good eye contact.  Cardiovascular: overall no swelling, no varicosities, no edema bilaterally, normal temperatures of the legs and arms, no clubbing, cyanosis and good capillary refill.  Lymphatic: palpation is normal.  Right shoulder is tender, abduction 100, forward 135.  NV intact.  Grips normal.  All other systems reviewed and are negative   The patient has been educated about the nature of the problem(s) and counseled on treatment options.  The patient appeared to understand what I have discussed and is in agreement with it.  Encounter Diagnosis  Name Primary?  . Chronic right shoulder pain Yes    PLAN Call if any problems.  Precautions discussed.  Continue current medications.    Return to clinic 1 month   Continue PT.  Electronically Signed Sanjuana Kava, MD 6/8/202110:09 AM

## 2020-01-05 ENCOUNTER — Ambulatory Visit: Payer: Managed Care, Other (non HMO) | Admitting: Physical Therapy

## 2020-01-05 ENCOUNTER — Encounter: Payer: Self-pay | Admitting: Physical Therapy

## 2020-01-05 ENCOUNTER — Other Ambulatory Visit: Payer: Self-pay

## 2020-01-05 DIAGNOSIS — M25511 Pain in right shoulder: Secondary | ICD-10-CM | POA: Diagnosis not present

## 2020-01-05 DIAGNOSIS — M25611 Stiffness of right shoulder, not elsewhere classified: Secondary | ICD-10-CM

## 2020-01-05 DIAGNOSIS — M6281 Muscle weakness (generalized): Secondary | ICD-10-CM

## 2020-01-05 DIAGNOSIS — G8929 Other chronic pain: Secondary | ICD-10-CM

## 2020-01-05 NOTE — Therapy (Signed)
[facility] [place], [place], [address] Phone: [phone]   Fax:  [phone]  Physical Therapy Treatment  Patient Details  Name: Tammy Mayo MRN: [id number] Date of Birth: [birth year] Referring Provider (PT): Sanjuana Kava MD   Encounter Date: 01/05/2020  PT End of Session - 01/05/20 1350    Visit Number  5    Number of Visits  12    Date for PT Re-Evaluation  01/26/20    PT Start Time  3818    PT Stop Time  1435    PT Time Calculation (min)  50 min    Activity Tolerance  Patient tolerated treatment well    Behavior During Therapy  [facility] for tasks assessed/performed       Past Medical History:  Diagnosis Date  . Anemia   . Arthritis   . Chronic back pain    lumbar 4-5 slip and radicular right leg pain  . Depression   . Diabetes mellitus without complication ([place])   . GERD (gastroesophageal reflux disease)   . Hyperlipidemia   . Hypertension   . Seasonal allergies   . Vitamin D deficiency   . Wears glasses     Past Surgical History:  Procedure Laterality Date  . BACK SURGERY    . CESAREAN SECTION     x 1  . CHOLECYSTECTOMY N/A 11/13/2015   Procedure: LAPAROSCOPIC CHOLECYSTECTOMY;  Surgeon: Rolm Bookbinder, MD;  Location: [place];  Service: General;  Laterality: N/A;  . DILATION AND CURETTAGE OF UTERUS    . HERNIA REPAIR     umbilical hernia repair as a child  . TUBAL LIGATION      There were no vitals filed for this visit.  Subjective Assessment - 01/05/20 1349    Subjective  COVID-19 screen performed prior to patient entering clinic. Pt reporting ongoing 4.5/10 pain in right shoulder. MD follow up went well and is to continue PT.    Pertinent History  DM, HTN, lumbar surgery.    Diagnostic tests  X-ray.    Patient Stated Goals  Use right arm without pain.  Sleep on right side.    Currently in Pain?  Yes    Pain Score  5     Pain Location  Shoulder    Pain Orientation  Right    Pain  Descriptors / Indicators  Aching;Sore    Pain Type  Chronic pain    Pain Onset  More than a month ago    Pain Frequency  Constant         [facility] PT Assessment - 01/05/20 0001      Assessment   Medical Diagnosis  Chronic right shoulder pain.    Referring Provider (PT)  Sanjuana Kava MD    Next MD Visit  February 03, 2020                    [facility] Adult PT Treatment/Exercise - 01/05/20 0001      Exercises   Exercises  Shoulder      Shoulder Exercises: Supine   External Rotation  AAROM;Right;10 reps    Flexion  AAROM;Both;10 reps      Shoulder Exercises: Standing   Other Standing Exercises  horizontal adduction x10      Shoulder Exercises: Pulleys   Flexion  5 minutes      Shoulder Exercises: ROM/Strengthening   UBE (Upper Arm Bike)  120 RPM 8 mins (4 fwd, 4 bwd)    Ranger  standing flexion, CW, CCW circles x2 mins each      Shoulder Exercises: Lawyer  30 seconds;3 reps    Internal Rotation Stretch  30 seconds    Internal Rotation Stretch Limitations  3 reps      Modalities   Modalities  Electrical Stimulation;Moist Heat      Moist Heat Therapy   Number Minutes Moist Heat  10 Minutes    Moist Heat Location  Shoulder      Electrical Stimulation   Electrical Stimulation Location  Right shoulder.    Electrical Stimulation Action  IFC    Electrical Stimulation Parameters  80-150 hz x10 mins    Electrical Stimulation Goals  Pain                  PT Long Term Goals - 12/29/19 1432      PT LONG TERM GOAL #1   Title  Independent with a HEP.    Status  On-going      PT LONG TERM GOAL #2   Title  Active right shoulder flexion to 145 degrees so the patient can easily reach overhead.    Status  On-going      PT LONG TERM GOAL #3   Title  Active ER to 70 degrees+ to allow for easily donning/doffing of apparel.    Status  On-going      PT LONG TERM GOAL #4   Title  Increase ROM so patient is able to reach behind back to L3.     Status  On-going      PT LONG TERM GOAL #5   Title  Increase right shoulder strength to a solid 4+/5 to increase stability for performance of functional activities.    Status  On-going      PT LONG TERM GOAL #6   Title  Perform ADL's with pain not > 3/10.    Status  On-going            Plan - 01/05/20 1358    Clinical Impression Statement  Patient responded well to the progression of TEs with minimal reports of increased pain. Patient required verbal cuing for posture during UBE and maintained form for remainder of the exercise.    Personal Factors and Comorbidities  Comorbidity 1;Comorbidity 2    Comorbidities  DM, HTN, lumbar surgery.    Examination-Activity Limitations  Reach Overhead;Other    Examination-Participation Restrictions  Other    Stability/Clinical Decision Making  Evolving/Moderate complexity    Clinical Decision Making  Low    Rehab Potential  Good    PT Duration  6 weeks    PT Treatment/Interventions  ADLs/Self Care Home Management;Cryotherapy;Electrical Stimulation;Ultrasound;Moist Heat;Therapeutic activities;Therapeutic exercise;Manual techniques;Patient/family education;Passive range of motion;Dry needling;Joint Manipulations;Vasopneumatic Device    PT Next Visit Plan  Pulleys, UE Ranger, right shoulder PROM and capsular stretching.  Progress to PRE's.  Modalities PRN.    Consulted and Agree with Plan of Care  Patient       Patient will benefit from skilled therapeutic intervention in order to improve the following deficits and impairments:  Pain, Decreased activity tolerance, Decreased strength, Decreased range of motion  Visit Diagnosis: Chronic right shoulder pain  Stiffness of right shoulder, not elsewhere classified  Muscle weakness (generalized)     Problem List Patient Active Problem List   Diagnosis Date Noted  . Chronic right shoulder pain 11/22/2019  . Constipation 11/22/2019  . Type 2 diabetes mellitus with diabetic neuropathy, with  long-term  current use of insulin (HCC) 11/22/2019  . Back pain 06/21/2015  . HTN (hypertension) 10/23/2010  . Vitamin D deficiency 10/23/2010  . Hyperlipemia 10/23/2010  . Plantar fasciitis 10/23/2010    Guss Bunde, PT, DPT 01/05/2020, 2:38 PM  [facility] [address] [place], [place], [address] Phone: [phone]   Fax:  [phone]  Name: KATRINA DADDONA MRN: [id number] Date of Birth: Jan 28, 1966

## 2020-01-07 ENCOUNTER — Other Ambulatory Visit: Payer: Self-pay

## 2020-01-07 ENCOUNTER — Ambulatory Visit: Payer: Managed Care, Other (non HMO) | Admitting: Physical Therapy

## 2020-01-07 DIAGNOSIS — M25611 Stiffness of right shoulder, not elsewhere classified: Secondary | ICD-10-CM

## 2020-01-07 DIAGNOSIS — M6281 Muscle weakness (generalized): Secondary | ICD-10-CM

## 2020-01-07 DIAGNOSIS — G8929 Other chronic pain: Secondary | ICD-10-CM

## 2020-01-07 DIAGNOSIS — M25511 Pain in right shoulder: Secondary | ICD-10-CM | POA: Diagnosis not present

## 2020-01-07 NOTE — Therapy (Signed)
[facility] [address] [place], [place], [address] Phone: [phone]   Fax:  [phone]  Physical Therapy Treatment  Patient Details  Name: SHAYNAH HUND MRN: [id number] Date of Birth: 10/23/1965 Referring Provider (PT): Darreld Mclean MD   Encounter Date: 01/07/2020   PT End of Session - 01/07/20 1152    Visit Number 6    Number of Visits 12    Date for PT Re-Evaluation 01/26/20    PT Start Time 1117    PT Stop Time 1204    PT Time Calculation (min) 47 min    Activity Tolerance Patient tolerated treatment well    Behavior During Therapy [facility] for tasks assessed/performed           Past Medical History:  Diagnosis Date  . Anemia   . Arthritis   . Chronic back pain    lumbar 4-5 slip and radicular right leg pain  . Depression   . Diabetes mellitus without complication (HCC)   . GERD (gastroesophageal reflux disease)   . Hyperlipidemia   . Hypertension   . Seasonal allergies   . Vitamin D deficiency   . Wears glasses     Past Surgical History:  Procedure Laterality Date  . BACK SURGERY    . CESAREAN SECTION     x 1  . CHOLECYSTECTOMY N/A 11/13/2015   Procedure: LAPAROSCOPIC CHOLECYSTECTOMY;  Surgeon: Emelia Loron, MD;  Location: [facility] OR;  Service: General;  Laterality: N/A;  . DILATION AND CURETTAGE OF UTERUS    . HERNIA REPAIR     umbilical hernia repair as a child  . TUBAL LIGATION      There were no vitals filed for this visit.   Subjective Assessment - 01/07/20 1124    Subjective COVID-19 screen performed prior to patient entering clinic.  Still hurting.    Pertinent History DM, HTN, lumbar surgery.    Diagnostic tests X-ray.    Patient Stated Goals Use right arm without pain.  Sleep on right side.    Currently in Pain? Yes    Pain Score 5     Pain Location Shoulder    Pain Orientation Right    Pain Descriptors / Indicators Aching;Sore    Pain Type Chronic pain                              [facility] Adult PT Treatment/Exercise - 01/07/20 0001      Exercises   Exercises Shoulder      Shoulder Exercises: Standing   Other Standing Exercises Yellow theraband IR/ER to fatigue.      Shoulder Exercises: Pulleys   Flexion 5 minutes      Shoulder Exercises: ROM/Strengthening   UBE (Upper Arm Bike) 120 RPM'x x 6 minutes.      Programme researcher, broadcasting/film/video Location Right shoulder.    Electrical Stimulation Action IFC    Electrical Stimulation Parameters 80-150 Hz x 20 minutes.    Electrical Stimulation Goals Pain      Manual Therapy   Manual Therapy Soft tissue mobilization    Passive ROM STW/M including IASTM to patient right middle deltoid x 8 minutes.                       PT Long Term Goals - 12/29/19 1432      PT LONG TERM GOAL #1   Title Independent with a HEP.  Status On-going      PT LONG TERM GOAL #2   Title Active right shoulder flexion to 145 degrees so the patient can easily reach overhead.    Status On-going      PT LONG TERM GOAL #3   Title Active ER to 70 degrees+ to allow for easily donning/doffing of apparel.    Status On-going      PT LONG TERM GOAL #4   Title Increase ROM so patient is able to reach behind back to L3.    Status On-going      PT LONG TERM GOAL #5   Title Increase right shoulder strength to a solid 4+/5 to increase stability for performance of functional activities.    Status On-going      PT LONG TERM GOAL #6   Title Perform ADL's with pain not > 3/10.    Status On-going                 Plan - 01/07/20 1151    Clinical Impression Statement Patient with c/o right middle deltoid pain with palpable tenderness.  She did well with STW/M today.    Personal Factors and Comorbidities Comorbidity 1;Comorbidity 2    Comorbidities DM, HTN, lumbar surgery.    Examination-Activity Limitations Reach Overhead;Other    Examination-Participation Restrictions  Other    Stability/Clinical Decision Making Evolving/Moderate complexity    Rehab Potential Good    PT Duration 6 weeks    PT Treatment/Interventions ADLs/Self Care Home Management;Cryotherapy;Electrical Stimulation;Ultrasound;Moist Heat;Therapeutic activities;Therapeutic exercise;Manual techniques;Patient/family education;Passive range of motion;Dry needling;Joint Manipulations;Vasopneumatic Device    PT Next Visit Plan Pulleys, UE Ranger, right shoulder PROM and capsular stretching.  Progress to PRE's.  Modalities PRN.    Consulted and Agree with Plan of Care Patient           Patient will benefit from skilled therapeutic intervention in order to improve the following deficits and impairments:  Pain, Decreased activity tolerance, Decreased strength, Decreased range of motion  Visit Diagnosis: Chronic right shoulder pain  Stiffness of right shoulder, not elsewhere classified  Muscle weakness (generalized)     Problem List Patient Active Problem List   Diagnosis Date Noted  . Chronic right shoulder pain 11/22/2019  . Constipation 11/22/2019  . Type 2 diabetes mellitus with diabetic neuropathy, with long-term current use of insulin ([place]) 11/22/2019  . Back pain 06/21/2015  . HTN (hypertension) 10/23/2010  . Vitamin D deficiency 10/23/2010  . Hyperlipemia 10/23/2010  . Plantar fasciitis 10/23/2010    Chiara Coltrin, [place] MPT 01/07/2020, 12:04 PM  [facility] [address] [place], [place], [address] Phone: [phone]   Fax:  [phone]  Name: WILLEEN NOVAK MRN: [id number] Date of Birth: 1966/05/18

## 2020-01-11 ENCOUNTER — Other Ambulatory Visit: Payer: Self-pay

## 2020-01-11 ENCOUNTER — Ambulatory Visit: Payer: Managed Care, Other (non HMO) | Admitting: Physical Therapy

## 2020-01-11 DIAGNOSIS — M6281 Muscle weakness (generalized): Secondary | ICD-10-CM

## 2020-01-11 DIAGNOSIS — M25511 Pain in right shoulder: Secondary | ICD-10-CM | POA: Diagnosis not present

## 2020-01-11 DIAGNOSIS — M25611 Stiffness of right shoulder, not elsewhere classified: Secondary | ICD-10-CM

## 2020-01-11 NOTE — Therapy (Signed)
[facility] [address] [place], [place], [address] Phone: [phone]   Fax:  [phone]  Physical Therapy Treatment  Patient Details  Name: Tammy Mayo MRN: [id number] Date of Birth: 1966/02/17 Referring Provider (PT): Darreld Mclean MD   Encounter Date: 01/11/2020   PT End of Session - 01/11/20 1351    Visit Number 7    Number of Visits 12    Date for PT Re-Evaluation 01/26/20    PT Start Time 0145    PT Stop Time 0230    PT Time Calculation (min) 45 min    Activity Tolerance Patient tolerated treatment well    Behavior During Therapy [facility] for tasks assessed/performed           Past Medical History:  Diagnosis Date  . Anemia   . Arthritis   . Chronic back pain    lumbar 4-5 slip and radicular right leg pain  . Depression   . Diabetes mellitus without complication (HCC)   . GERD (gastroesophageal reflux disease)   . Hyperlipidemia   . Hypertension   . Seasonal allergies   . Vitamin D deficiency   . Wears glasses     Past Surgical History:  Procedure Laterality Date  . BACK SURGERY    . CESAREAN SECTION     x 1  . CHOLECYSTECTOMY N/A 11/13/2015   Procedure: LAPAROSCOPIC CHOLECYSTECTOMY;  Surgeon: Emelia Loron, MD;  Location: [facility] OR;  Service: General;  Laterality: N/A;  . DILATION AND CURETTAGE OF UTERUS    . HERNIA REPAIR     umbilical hernia repair as a child  . TUBAL LIGATION      There were no vitals filed for this visit.   Subjective Assessment - 01/11/20 1352    Subjective COVID-19 screen performed prior to patient entering clinic.  Last treatment helped.    Pertinent History DM, HTN, lumbar surgery.    Diagnostic tests X-ray.    Patient Stated Goals Use right arm without pain.  Sleep on right side.    Currently in Pain? Yes    Pain Score 4     Pain Location Shoulder    Pain Orientation Right    Pain Descriptors / Indicators Aching;Sore    Pain Type Chronic pain                              [facility] Adult PT Treatment/Exercise - 01/11/20 0001      Exercises   Exercises Shoulder      Shoulder Exercises: Standing   Other Standing Exercises Yellow theraband RW4 to fatigue all directions.      Shoulder Exercises: Pulleys   Flexion 5 minutes    Other Pulley Exercises UE Ranger on wall x 5 minutes.      Shoulder Exercises: ROM/Strengthening   UBE (Upper Arm Bike) 90 RPM's x 8 minutes.      Modalities   Modalities Estate agent Stimulation Location Right shoulder.    Electrical Stimulation Action IFC    Electrical Stimulation Parameters 80-150 Hz x 15 minutes.    Electrical Stimulation Goals Pain      Vasopneumatic   Number Minutes Vasopneumatic  15 minutes    Vasopnuematic Location  --   Right shoulder.   Vasopneumatic Pressure Low  PT Long Term Goals - 12/29/19 1432      PT LONG TERM GOAL #1   Title Independent with a HEP.    Status On-going      PT LONG TERM GOAL #2   Title Active right shoulder flexion to 145 degrees so the patient can easily reach overhead.    Status On-going      PT LONG TERM GOAL #3   Title Active ER to 70 degrees+ to allow for easily donning/doffing of apparel.    Status On-going      PT LONG TERM GOAL #4   Title Increase ROM so patient is able to reach behind back to L3.    Status On-going      PT LONG TERM GOAL #5   Title Increase right shoulder strength to a solid 4+/5 to increase stability for performance of functional activities.    Status On-going      PT LONG TERM GOAL #6   Title Perform ADL's with pain not > 3/10.    Status On-going                 Plan - 01/11/20 1418    Clinical Impression Statement Patient reporting some pain reduction since last treatment.  Added 2 theraband exercises which the patient performed without difficulty.    Personal Factors and Comorbidities  Comorbidity 1;Comorbidity 2    Comorbidities DM, HTN, lumbar surgery.    Examination-Activity Limitations Reach Overhead;Other    Examination-Participation Restrictions Other    Stability/Clinical Decision Making Evolving/Moderate complexity    Rehab Potential Good    PT Duration 6 weeks    PT Treatment/Interventions ADLs/Self Care Home Management;Cryotherapy;Electrical Stimulation;Ultrasound;Moist Heat;Therapeutic activities;Therapeutic exercise;Manual techniques;Patient/family education;Passive range of motion;Dry needling;Joint Manipulations;Vasopneumatic Device    PT Next Visit Plan Pulleys, UE Ranger, right shoulder PROM and capsular stretching.  Progress to PRE's.  Modalities PRN.    Consulted and Agree with Plan of Care Patient           Patient will benefit from skilled therapeutic intervention in order to improve the following deficits and impairments:  Pain, Decreased activity tolerance, Decreased strength, Decreased range of motion  Visit Diagnosis: Chronic right shoulder pain  Stiffness of right shoulder, not elsewhere classified  Muscle weakness (generalized)     Problem List Patient Active Problem List   Diagnosis Date Noted  . Chronic right shoulder pain 11/22/2019  . Constipation 11/22/2019  . Type 2 diabetes mellitus with diabetic neuropathy, with long-term current use of insulin ([place]) 11/22/2019  . Back pain 06/21/2015  . HTN (hypertension) 10/23/2010  . Vitamin D deficiency 10/23/2010  . Hyperlipemia 10/23/2010  . Plantar fasciitis 10/23/2010    Angeletta Goelz, [place] MPT 01/11/2020, 2:30 PM  [facility] [address] [place], [place], [address] Phone: [phone]   Fax:  [phone]  Name: Tammy Mayo MRN: [id number] Date of Birth: April 12, 1966

## 2020-01-14 ENCOUNTER — Ambulatory Visit: Payer: Managed Care, Other (non HMO) | Admitting: Physical Therapy

## 2020-01-14 DIAGNOSIS — M25611 Stiffness of right shoulder, not elsewhere classified: Secondary | ICD-10-CM

## 2020-01-14 DIAGNOSIS — G8929 Other chronic pain: Secondary | ICD-10-CM

## 2020-01-14 DIAGNOSIS — M6281 Muscle weakness (generalized): Secondary | ICD-10-CM

## 2020-01-14 DIAGNOSIS — M25511 Pain in right shoulder: Secondary | ICD-10-CM | POA: Diagnosis not present

## 2020-01-14 NOTE — Therapy (Signed)
[facility] [address] [place], [place], [address] Phone: [phone]   Fax:  [phone]  Physical Therapy Treatment  Patient Details  Name: Tammy Mayo MRN: [id number] Date of Birth: 1966/04/24 Referring Provider (PT): Darreld Mclean MD   Encounter Date: 01/14/2020   PT End of Session - 01/14/20 1225    Visit Number 8    Number of Visits 12    Date for PT Re-Evaluation 01/26/20    PT Start Time 1115    PT Stop Time 1204    PT Time Calculation (min) 49 min    Activity Tolerance Patient tolerated treatment well    Behavior During Therapy [facility] for tasks assessed/performed           Past Medical History:  Diagnosis Date  . Anemia   . Arthritis   . Chronic back pain    lumbar 4-5 slip and radicular right leg pain  . Depression   . Diabetes mellitus without complication (HCC)   . GERD (gastroesophageal reflux disease)   . Hyperlipidemia   . Hypertension   . Seasonal allergies   . Vitamin D deficiency   . Wears glasses     Past Surgical History:  Procedure Laterality Date  . BACK SURGERY    . CESAREAN SECTION     x 1  . CHOLECYSTECTOMY N/A 11/13/2015   Procedure: LAPAROSCOPIC CHOLECYSTECTOMY;  Surgeon: Emelia Loron, MD;  Location: [facility] OR;  Service: General;  Laterality: N/A;  . DILATION AND CURETTAGE OF UTERUS    . HERNIA REPAIR     umbilical hernia repair as a child  . TUBAL LIGATION      There were no vitals filed for this visit.   Subjective Assessment - 01/14/20 1204    Subjective COVID-19 screen performed prior to patient entering clinic.  Better.    Pertinent History DM, HTN, lumbar surgery.    Diagnostic tests X-ray.    Patient Stated Goals Use right arm without pain.  Sleep on right side.    Currently in Pain? Yes    Pain Score 4     Pain Location Shoulder    Pain Orientation Right    Pain Descriptors / Indicators Aching;Sore    Pain Type Chronic pain    Pain Onset More than a month ago                              [facility] Adult PT Treatment/Exercise - 01/14/20 0001      Exercises   Exercises Shoulder      Shoulder Exercises: Seated   External Rotation Weight (lbs) 2# with right shoulder supported at 45 degrees of abduction performed x 2 minutes.      Shoulder Exercises: Pulleys   Flexion 5 minutes      Shoulder Exercises: ROM/Strengthening   UBE (Upper Arm Bike) 90 RPM's x 8 minutes.      Modalities   Modalities Electrical Stimulation;Ultrasound      Programme researcher, broadcasting/film/video Location RT shoulder.    Electrical Stimulation Action IFC    Electrical Stimulation Parameters 80-150 Hz x 20 minutes.    Electrical Stimulation Goals Pain      Ultrasound   Ultrasound Location Right shoulder    Ultrasound Parameters Combo e'stim/U/S at 1.50 W/CM2 x 8 minutes.    Ultrasound Goals Pain  PT Long Term Goals - 12/29/19 1432      PT LONG TERM GOAL #1   Title Independent with a HEP.    Status On-going      PT LONG TERM GOAL #2   Title Active right shoulder flexion to 145 degrees so the patient can easily reach overhead.    Status On-going      PT LONG TERM GOAL #3   Title Active ER to 70 degrees+ to allow for easily donning/doffing of apparel.    Status On-going      PT LONG TERM GOAL #4   Title Increase ROM so patient is able to reach behind back to L3.    Status On-going      PT LONG TERM GOAL #5   Title Increase right shoulder strength to a solid 4+/5 to increase stability for performance of functional activities.    Status On-going      PT LONG TERM GOAL #6   Title Perform ADL's with pain not > 3/10.    Status On-going                  Patient will benefit from skilled therapeutic intervention in order to improve the following deficits and impairments:     Visit Diagnosis: Chronic right shoulder pain  Stiffness of right shoulder, not elsewhere classified  Muscle  weakness (generalized)     Problem List Patient Active Problem List   Diagnosis Date Noted  . Chronic right shoulder pain 11/22/2019  . Constipation 11/22/2019  . Type 2 diabetes mellitus with diabetic neuropathy, with long-term current use of insulin ([place]) 11/22/2019  . Back pain 06/21/2015  . HTN (hypertension) 10/23/2010  . Vitamin D deficiency 10/23/2010  . Hyperlipemia 10/23/2010  . Plantar fasciitis 10/23/2010    Francely Craw, [place] MPT 01/14/2020, 12:26 PM  [facility] [address] [place], [place], [address] Phone: [phone]   Fax:  [phone]  Name: KESLEE HARRINGTON MRN: [id number] Date of Birth: [birth year]

## 2020-01-19 ENCOUNTER — Other Ambulatory Visit: Payer: Self-pay

## 2020-01-19 ENCOUNTER — Ambulatory Visit: Payer: Managed Care, Other (non HMO) | Admitting: Physical Therapy

## 2020-01-19 DIAGNOSIS — M6281 Muscle weakness (generalized): Secondary | ICD-10-CM

## 2020-01-19 DIAGNOSIS — M25511 Pain in right shoulder: Secondary | ICD-10-CM | POA: Diagnosis not present

## 2020-01-19 DIAGNOSIS — M25611 Stiffness of right shoulder, not elsewhere classified: Secondary | ICD-10-CM

## 2020-01-19 NOTE — Therapy (Signed)
[facility] [address] [place], [place], [address] Phone: [phone]   Fax:  [phone]  Physical Therapy Treatment  Patient Details  Name: Tammy Mayo MRN: [id number] Date of Birth: 1966/01/19 Referring Provider (PT): Darreld Mclean MD   Encounter Date: 01/19/2020   PT End of Session - 01/19/20 1425    Visit Number 9    Number of Visits 12    Date for PT Re-Evaluation 01/26/20    PT Start Time 0146    PT Stop Time 0231    PT Time Calculation (min) 45 min    Activity Tolerance Patient tolerated treatment well    Behavior During Therapy [facility] for tasks assessed/performed           Past Medical History:  Diagnosis Date  . Anemia   . Arthritis   . Chronic back pain    lumbar 4-5 slip and radicular right leg pain  . Depression   . Diabetes mellitus without complication (HCC)   . GERD (gastroesophageal reflux disease)   . Hyperlipidemia   . Hypertension   . Seasonal allergies   . Vitamin D deficiency   . Wears glasses     Past Surgical History:  Procedure Laterality Date  . BACK SURGERY    . CESAREAN SECTION     x 1  . CHOLECYSTECTOMY N/A 11/13/2015   Procedure: LAPAROSCOPIC CHOLECYSTECTOMY;  Surgeon: Emelia Loron, MD;  Location: [facility] OR;  Service: General;  Laterality: N/A;  . DILATION AND CURETTAGE OF UTERUS    . HERNIA REPAIR     umbilical hernia repair as a child  . TUBAL LIGATION      There were no vitals filed for this visit.   Subjective Assessment - 01/19/20 1357    Subjective COVID-19 screen performed prior to patient entering clinic.  Patient reported some ongoing pain in shoulder    Pertinent History DM, HTN, lumbar surgery.    Diagnostic tests X-ray.    Patient Stated Goals Use right arm without pain.  Sleep on right side.    Currently in Pain? Yes    Pain Score 5     Pain Location Shoulder    Pain Orientation Right    Pain Descriptors / Indicators Discomfort    Pain Type Chronic pain     Pain Onset More than a month ago    Pain Frequency Intermittent    Aggravating Factors  gets stiff with no movement    Pain Relieving Factors movement              [facility] PT Assessment - 01/19/20 0001      AROM   AROM Assessment Site Shoulder    Right/Left Shoulder Right    Right Shoulder Flexion 115 Degrees                         [facility] Adult PT Treatment/Exercise - 01/19/20 0001      Shoulder Exercises: Standing   Protraction Strengthening;Right;20 reps;Theraband    Theraband Level (Shoulder Protraction) Level 1 (Yellow)    External Rotation Strengthening;Right;20 reps;Theraband    Theraband Level (Shoulder External Rotation) Level 1 (Yellow)    Internal Rotation Strengthening;Right;20 reps;Theraband    Theraband Level (Shoulder Internal Rotation) Level 1 (Yellow)    Retraction Strengthening;Right;20 reps;Theraband    Theraband Level (Shoulder Retraction) Level 1 (Yellow)      Shoulder Exercises: Pulleys   Flexion 5 minutes      Shoulder Exercises: ROM/Strengthening  UBE (Upper Arm Bike) 90 RPM's x 8 minutes.      Moist Heat Therapy   Number Minutes Moist Heat 10 Minutes    Moist Heat Location Shoulder      Electrical Stimulation   Electrical Stimulation Location RT shoulder.    Electrical Stimulation Action IFC    Electrical Stimulation Parameters 80-150hz  x75min    Electrical Stimulation Goals Pain      Ultrasound   Ultrasound Location Right shoulder    Ultrasound Parameters  combo US/ES @ 1.5w/cm2/50%/64mhz x31min    Ultrasound Goals Pain                       PT Long Term Goals - 01/19/20 1359      PT LONG TERM GOAL #1   Title Independent with a HEP.    Time 6    Period Weeks    Status On-going      PT LONG TERM GOAL #2   Title Active right shoulder flexion to 145 degrees so the patient can easily reach overhead.    Time 6    Period Weeks    Status On-going   AROM 115 degrees 01/19/20     PT LONG TERM GOAL #3   Title  Active ER to 70 degrees+ to allow for easily donning/doffing of apparel.    Time 6    Period Weeks    Status On-going      PT LONG TERM GOAL #4   Time 6    Period Weeks    Status On-going      PT LONG TERM GOAL #5   Title Increase right shoulder strength to a solid 4+/5 to increase stability for performance of functional activities.    Period Weeks    Status On-going      PT LONG TERM GOAL #6   Title Perform ADL's with pain not > 3/10.    Time 6    Period Weeks    Status On-going                 Plan - 01/19/20 1426    Clinical Impression Statement Patient tolerated treatment well today. Patient has reported ongong pain in shoulder yet with movement it feels some better per reported. Patient has improved AROM in right shoulder today. Patient reported doing RW4 exercises at home and today reviewed technique. Patient current goals ongoing due to limitations and pain.    Personal Factors and Comorbidities Comorbidity 1;Comorbidity 2    Comorbidities DM, HTN, lumbar surgery.    Examination-Activity Limitations Reach Overhead;Other    Examination-Participation Restrictions Other    Stability/Clinical Decision Making Evolving/Moderate complexity    Rehab Potential Good    PT Duration 6 weeks    PT Treatment/Interventions ADLs/Self Care Home Management;Cryotherapy;Electrical Stimulation;Ultrasound;Moist Heat;Therapeutic activities;Therapeutic exercise;Manual techniques;Patient/family education;Passive range of motion;Dry needling;Joint Manipulations;Vasopneumatic Device    PT Next Visit Plan cont with POC for ROM of right shoulder PROM and capsular stretching.  Progress to PRE's.  Modalities PRN.    Consulted and Agree with Plan of Care Patient           Patient will benefit from skilled therapeutic intervention in order to improve the following deficits and impairments:  Pain, Decreased activity tolerance, Decreased strength, Decreased range of motion  Visit  Diagnosis: Chronic right shoulder pain  Stiffness of right shoulder, not elsewhere classified  Muscle weakness (generalized)     Problem List Patient Active Problem List   Diagnosis Date  Noted  . Chronic right shoulder pain 11/22/2019  . Constipation 11/22/2019  . Type 2 diabetes mellitus with diabetic neuropathy, with long-term current use of insulin (HCC) 11/22/2019  . Back pain 06/21/2015  . HTN (hypertension) 10/23/2010  . Vitamin D deficiency 10/23/2010  . Hyperlipemia 10/23/2010  . Plantar fasciitis 10/23/2010    Hermelinda Dellen, PTA 01/19/2020, 2:38 PM  [facility] [address] [place], [place], [address] Phone: [phone]   Fax:  [phone]  Name: CENA BRUHN MRN: [id number] Date of Birth: July 16, 1966

## 2020-01-26 ENCOUNTER — Encounter: Payer: Self-pay | Admitting: Physical Therapy

## 2020-01-26 ENCOUNTER — Other Ambulatory Visit: Payer: Self-pay

## 2020-01-26 ENCOUNTER — Ambulatory Visit: Payer: Managed Care, Other (non HMO) | Admitting: Physical Therapy

## 2020-01-26 DIAGNOSIS — M25511 Pain in right shoulder: Secondary | ICD-10-CM

## 2020-01-26 DIAGNOSIS — M6281 Muscle weakness (generalized): Secondary | ICD-10-CM

## 2020-01-26 DIAGNOSIS — M25611 Stiffness of right shoulder, not elsewhere classified: Secondary | ICD-10-CM

## 2020-01-26 NOTE — Therapy (Signed)
[facility] [place], [place], [address] Phone: [phone]   Fax:  [phone]  Physical Therapy Treatment  Patient Details  Name: Tammy Mayo MRN: [id number] Date of Birth: 09-07-1965 Referring Provider (PT): Sanjuana Kava MD   Encounter Date: 01/26/2020   PT End of Session - 01/26/20 1359    Visit Number 10    Number of Visits 12    Date for PT Re-Evaluation 01/26/20    PT Start Time 1347    PT Stop Time 1440    PT Time Calculation (min) 53 min    Activity Tolerance Patient tolerated treatment well    Behavior During Therapy [facility] for tasks assessed/performed           Past Medical History:  Diagnosis Date   Anemia    Arthritis    Chronic back pain    lumbar 4-5 slip and radicular right leg pain   Depression    Diabetes mellitus without complication (HCC)    GERD (gastroesophageal reflux disease)    Hyperlipidemia    Hypertension    Seasonal allergies    Vitamin D deficiency    Wears glasses     Past Surgical History:  Procedure Laterality Date   BACK SURGERY     CESAREAN SECTION     x 1   CHOLECYSTECTOMY N/A 11/13/2015   Procedure: LAPAROSCOPIC CHOLECYSTECTOMY;  Surgeon: Rolm Bookbinder, MD;  Location: [place];  Service: General;  Laterality: N/A;   DILATION AND CURETTAGE OF UTERUS     HERNIA REPAIR     umbilical hernia repair as a child   TUBAL LIGATION      There were no vitals filed for this visit.   Subjective Assessment - 01/26/20 1358    Subjective COVID-19 screen performed prior to patient entering clinic.  Patient reported some ongoing pain in shoulder    Pertinent History DM, HTN, lumbar surgery.    Diagnostic tests X-ray.    Patient Stated Goals Use right arm without pain.  Sleep on right side.    Currently in Pain? Yes    Pain Score 4     Pain Location Shoulder    Pain Orientation Right    Pain Descriptors / Indicators Aching    Pain Type Chronic pain     Pain Onset More than a month ago    Pain Frequency Intermittent              [facility] PT Assessment - 01/26/20 0001      Assessment   Medical Diagnosis Chronic right shoulder pain.    Referring Provider (PT) Sanjuana Kava MD    Next MD Visit 02/03/2020      Precautions   Precautions None      Restrictions   Weight Bearing Restrictions No      ROM / Strength   AROM / PROM / Strength AROM;Strength      AROM   Overall AROM  Deficits    AROM Assessment Site Shoulder    Right/Left Shoulder Right    Right Shoulder Flexion 120 Degrees    Right Shoulder External Rotation 30 Degrees      Strength   Overall Strength Deficits    Overall Strength Comments painful    Strength Assessment Site Shoulder    Right/Left Shoulder Right    Right Shoulder Flexion 4/5    Right Shoulder Internal Rotation 4/5    Right Shoulder External Rotation 4-/5  [facility] Adult PT Treatment/Exercise - 01/26/20 0001      Shoulder Exercises: Standing   Protraction Strengthening;Right;20 reps;Theraband    Theraband Level (Shoulder Protraction) Level 1 (Yellow)    External Rotation Strengthening;Right;20 reps;Theraband    Theraband Level (Shoulder External Rotation) Level 1 (Yellow)    Internal Rotation Strengthening;Right;20 reps;Theraband    Theraband Level (Shoulder Internal Rotation) Level 1 (Yellow)    Extension Strengthening;Right;20 reps;Theraband    Theraband Level (Shoulder Extension) Level 1 (Yellow)      Shoulder Exercises: Pulleys   Flexion 5 minutes      Shoulder Exercises: ROM/Strengthening   UBE (Upper Arm Bike) 90 RPM's x 8 minutes.      Modalities   Modalities Electrical Stimulation;Moist Heat      Moist Heat Therapy   Number Minutes Moist Heat 15 Minutes    Moist Heat Location Shoulder      Electrical Stimulation   Electrical Stimulation Location R shoulder    Electrical Stimulation Action Pre-Mod    Electrical Stimulation Parameters 80-150  hz x10 min    Electrical Stimulation Goals Pain      Ultrasound   Ultrasound Location R deltoids    Ultrasound Parameters Combo 1.5 w/cm2, 100%, 1 mhz x10 min    Ultrasound Goals Pain                       PT Long Term Goals - 01/26/20 1420      PT LONG TERM GOAL #1   Title Independent with a HEP.    Time 6    Period Weeks    Status Achieved      PT LONG TERM GOAL #2   Title Active right shoulder flexion to 145 degrees so the patient can easily reach overhead.    Time 6    Period Weeks    Status Not Met   AROM 115 degrees 01/19/20     PT LONG TERM GOAL #3   Title Active ER to 70 degrees+ to allow for easily donning/doffing of apparel.    Time 6    Period Weeks    Status Not Met      PT LONG TERM GOAL #4   Title Increase ROM so patient is able to reach behind back to L3.    Baseline 4.5-5 hours (09/16/16)    Time 6    Period Weeks    Status Not Met   L4-L5 region 01/26/2020     PT LONG TERM GOAL #5   Title Increase right shoulder strength to a solid 4+/5 to increase stability for performance of functional activities.    Period Weeks    Status Not Met      PT LONG TERM GOAL #6   Title Perform ADL's with pain not > 3/10.    Time 6    Period Weeks    Status Not Met   About 6/10 per patient report 01/26/2020                Plan - 01/26/20 1434    Clinical Impression Statement Patient presented in clinic with reports of continued mid level pain which increases with activity per patient. Patient's R shoulder ROM limited by pain per patient. No complaints of pain reported during guided therex. Patient required intermittant multimodal cueing in order to correct RW4 technique. Normal modalities response noted following removal of the modalities.    Personal Factors and Comorbidities Comorbidity 1;Comorbidity 2    Comorbidities DM, HTN,  lumbar surgery.    Examination-Activity Limitations Reach Overhead;Other    Examination-Participation Restrictions Other      Stability/Clinical Decision Making Evolving/Moderate complexity    Rehab Potential Good    PT Duration 6 weeks    PT Treatment/Interventions ADLs/Self Care Home Management;Cryotherapy;Electrical Stimulation;Ultrasound;Moist Heat;Therapeutic activities;Therapeutic exercise;Manual techniques;Patient/family education;Passive range of motion;Dry needling;Joint Manipulations;Vasopneumatic Device    PT Next Visit Plan cont with POC for ROM of right shoulder PROM and capsular stretching.  Progress to PRE's.  Modalities PRN.    Consulted and Agree with Plan of Care Patient           Patient will benefit from skilled therapeutic intervention in order to improve the following deficits and impairments:  Pain, Decreased activity tolerance, Decreased strength, Decreased range of motion  Visit Diagnosis: Chronic right shoulder pain  Stiffness of right shoulder, not elsewhere classified  Muscle weakness (generalized)     Problem List Patient Active Problem List   Diagnosis Date Noted   Chronic right shoulder pain 11/22/2019   Constipation 11/22/2019   Type 2 diabetes mellitus with diabetic neuropathy, with long-term current use of insulin ([place]) 11/22/2019   Back pain 06/21/2015   HTN (hypertension) 10/23/2010   Vitamin D deficiency 10/23/2010   Hyperlipemia 10/23/2010   Plantar fasciitis 10/23/2010    Standley Brooking, PTA 01/26/20 3:07 PM   [facility] [address] [place], [place], [address] Phone: [phone]   Fax:  [phone]  Name: NOMA QUIJAS MRN: [id number] Date of Birth: 1966/03/22  Progress Note Reporting Period 12/15/19 to 01/26/20  See note below for Objective Data and Assessment of Progress/Goals. Continued right shoulder pain.    Mali Applegate MPT

## 2020-02-03 ENCOUNTER — Ambulatory Visit (INDEPENDENT_AMBULATORY_CARE_PROVIDER_SITE_OTHER): Payer: Managed Care, Other (non HMO) | Admitting: Orthopaedic Surgery

## 2020-02-03 ENCOUNTER — Encounter: Payer: Self-pay | Admitting: Orthopaedic Surgery

## 2020-02-03 ENCOUNTER — Other Ambulatory Visit: Payer: Self-pay

## 2020-02-03 VITALS — BP 147/91 | HR 86 | Ht 64.0 in | Wt 180.0 lb

## 2020-02-03 DIAGNOSIS — G8929 Other chronic pain: Secondary | ICD-10-CM

## 2020-02-03 DIAGNOSIS — M25511 Pain in right shoulder: Secondary | ICD-10-CM

## 2020-02-03 NOTE — Progress Notes (Signed)
PROCEDURE NOTE:  The patient request injection, verbal consent was obtained.  The right shoulder was prepped appropriately after time out was performed.   Sterile technique was observed and injection of 1 cc of Depo-Medrol 40 mg with several cc's of plain xylocaine. Anesthesia was provided by ethyl chloride and a 20-gauge needle was used to inject the shoulder area. A posterior approach was used.  The injection was tolerated well.  A band aid dressing was applied.  The patient was advised to apply ice later today and tomorrow to the injection sight as needed.  Continue PT.  I have reviewed the notes.  Return in three weeks.  Do exercises at home.  Call if any problem.  Precautions discussed.   Electronically Signed Darreld Mclean, MD 7/8/202110:20 AM

## 2020-02-08 ENCOUNTER — Encounter: Payer: Self-pay | Admitting: Physical Therapy

## 2020-02-08 ENCOUNTER — Other Ambulatory Visit: Payer: Self-pay

## 2020-02-08 ENCOUNTER — Ambulatory Visit: Payer: Managed Care, Other (non HMO) | Attending: Orthopaedic Surgery | Admitting: Physical Therapy

## 2020-02-08 DIAGNOSIS — M6281 Muscle weakness (generalized): Secondary | ICD-10-CM | POA: Diagnosis present

## 2020-02-08 DIAGNOSIS — M25511 Pain in right shoulder: Secondary | ICD-10-CM | POA: Diagnosis present

## 2020-02-08 DIAGNOSIS — M25611 Stiffness of right shoulder, not elsewhere classified: Secondary | ICD-10-CM | POA: Insufficient documentation

## 2020-02-08 DIAGNOSIS — G8929 Other chronic pain: Secondary | ICD-10-CM | POA: Diagnosis present

## 2020-02-08 NOTE — Therapy (Signed)
[facility] [place], [place], [address] Phone: [phone]   Fax:  [phone]  Physical Therapy Treatment  Patient Details  Name: Tammy Mayo MRN: [id number] Date of Birth: [birth year] Referring Provider (PT): Sanjuana Kava MD   Encounter Date: 02/08/2020   PT End of Session - 02/08/20 1438    Visit Number 11    Number of Visits 12    Date for PT Re-Evaluation 01/26/20    PT Start Time 1430    PT Stop Time 1520    PT Time Calculation (min) 50 min    Activity Tolerance Patient tolerated treatment well    Behavior During Therapy [facility] for tasks assessed/performed           Past Medical History:  Diagnosis Date  . Anemia   . Arthritis   . Chronic back pain    lumbar 4-5 slip and radicular right leg pain  . Depression   . Diabetes mellitus without complication ([place])   . GERD (gastroesophageal reflux disease)   . Hyperlipidemia   . Hypertension   . Seasonal allergies   . Vitamin D deficiency   . Wears glasses     Past Surgical History:  Procedure Laterality Date  . BACK SURGERY    . CESAREAN SECTION     x 1  . CHOLECYSTECTOMY N/A 11/13/2015   Procedure: LAPAROSCOPIC CHOLECYSTECTOMY;  Surgeon: Rolm Bookbinder, MD;  Location: [facility];  Service: General;  Laterality: N/A;  . DILATION AND CURETTAGE OF UTERUS    . HERNIA REPAIR     umbilical hernia repair as a child  . TUBAL LIGATION      There were no vitals filed for this visit.   Subjective Assessment - 02/08/20 1437    Subjective COVID-19 screen performed prior to patient entering clinic. Patient reported getting an injection in the right shoulder but still with ongoing pain. Reports to complete remaining visits then HEP per MD.    Pertinent History DM, HTN, lumbar surgery.    Diagnostic tests X-ray.    Patient Stated Goals Use right arm without pain.  Sleep on right side.    Currently in Pain? Yes    Pain Score 4    3.5/10   Pain Location  Shoulder    Pain Orientation Right    Pain Descriptors / Indicators Aching    Pain Type Chronic pain    Pain Onset More than a month ago    Pain Frequency Intermittent              [facility] PT Assessment - 02/08/20 0001      Assessment   Medical Diagnosis Chronic right shoulder pain.    Referring Provider (PT) Sanjuana Kava MD      Precautions   Precautions None      Restrictions   Weight Bearing Restrictions No                         [facility] Adult PT Treatment/Exercise - 02/08/20 0001      Shoulder Exercises: Standing   Protraction Strengthening;Right;20 reps;Theraband    Theraband Level (Shoulder Protraction) Level 2 (Red)    External Rotation Strengthening;Right;20 reps;Theraband    Theraband Level (Shoulder External Rotation) Level 2 (Red)    Internal Rotation Strengthening;Right;20 reps;Theraband    Theraband Level (Shoulder Internal Rotation) Level 2 (Red)    Extension Strengthening;Right;20 reps;Theraband    Theraband Level (Shoulder Extension) Level 2 (Red)  Retraction Strengthening;Right;20 reps;Theraband    Theraband Level (Shoulder Retraction) Level 2 (Red)      Shoulder Exercises: Pulleys   Flexion 5 minutes      Shoulder Exercises: ROM/Strengthening   UBE (Upper Arm Bike) 90 RPM's x 8 minutes.      Modalities   Modalities Electrical Stimulation;Moist Heat      Moist Heat Therapy   Number Minutes Moist Heat 10 Minutes    Moist Heat Location Shoulder      Electrical Stimulation   Electrical Stimulation Location R shoulder    Electrical Stimulation Action pre-mod    Electrical Stimulation Parameters 80-150 hz x10 mins    Electrical Stimulation Goals Pain      Ultrasound   Ultrasound Location R deltoid    Ultrasound Parameters combo 1.5 w/cm2 100% 65mz x8 mins    Ultrasound Goals Pain                       PT Long Term Goals - 01/26/20 1420      PT LONG TERM GOAL #1   Title Independent with a HEP.    Time 6     Period Weeks    Status Achieved      PT LONG TERM GOAL #2   Title Active right shoulder flexion to 145 degrees so the patient can easily reach overhead.    Time 6    Period Weeks    Status Not Met   AROM 115 degrees 01/19/20     PT LONG TERM GOAL #3   Title Active ER to 70 degrees+ to allow for easily donning/doffing of apparel.    Time 6    Period Weeks    Status Not Met      PT LONG TERM GOAL #4   Title Increase ROM so patient is able to reach behind back to L3.    Baseline 4.5-5 hours (09/16/16)    Time 6    Period Weeks    Status Not Met   L4-L5 region 01/26/2020     PT LONG TERM GOAL #5   Title Increase right shoulder strength to a solid 4+/5 to increase stability for performance of functional activities.    Period Weeks    Status Not Met      PT LONG TERM GOAL #6   Title Perform ADL's with pain not > 3/10.    Time 6    Period Weeks    Status Not Met   About 6/10 per patient report 01/26/2020                Plan - 02/08/20 1520    Clinical Impression Statement Patient responded well to therapy session with the progression of resistance with RW4 exercises. Patient demonstrated good form and technique after cuing. No increase of pain during TEs. Patient and PT discussed HEP and was provided with a handout for home TENs unit. Patient reported understanding. No adverse effects after combo, E-stim, and MHP.    Personal Factors and Comorbidities Comorbidity 1;Comorbidity 2    Comorbidities DM, HTN, lumbar surgery.    Examination-Activity Limitations Reach Overhead;Other    Examination-Participation Restrictions Other    Stability/Clinical Decision Making Evolving/Moderate complexity    Clinical Decision Making Low    Rehab Potential Good    PT Duration 6 weeks    PT Treatment/Interventions ADLs/Self Care Home Management;Cryotherapy;Electrical Stimulation;Ultrasound;Moist Heat;Therapeutic activities;Therapeutic exercise;Manual techniques;Patient/family education;Passive  range of motion;Dry needling;Joint Manipulations;Vasopneumatic Device    PT Next  Visit Plan Assess goals; DC to home program.    Consulted and Agree with Plan of Care Patient           Patient will benefit from skilled therapeutic intervention in order to improve the following deficits and impairments:  Pain, Decreased activity tolerance, Decreased strength, Decreased range of motion  Visit Diagnosis: Chronic right shoulder pain  Stiffness of right shoulder, not elsewhere classified  Muscle weakness (generalized)     Problem List Patient Active Problem List   Diagnosis Date Noted  . Chronic right shoulder pain 11/22/2019  . Constipation 11/22/2019  . Type 2 diabetes mellitus with diabetic neuropathy, with long-term current use of insulin ([place]) 11/22/2019  . Back pain 06/21/2015  . HTN (hypertension) 10/23/2010  . Vitamin D deficiency 10/23/2010  . Hyperlipemia 10/23/2010  . Plantar fasciitis 10/23/2010    Gabriela Eves, PT, DPT 02/08/2020, 3:37 PM  [facility] [address] [place], [place], [address] Phone: [phone]   Fax:  [phone]  Name: Tammy Mayo MRN: [id number] Date of Birth: 10-29-1965

## 2020-02-10 ENCOUNTER — Encounter: Payer: Managed Care, Other (non HMO) | Admitting: Physical Therapy

## 2020-02-15 ENCOUNTER — Other Ambulatory Visit: Payer: Self-pay

## 2020-02-15 ENCOUNTER — Ambulatory Visit: Payer: Managed Care, Other (non HMO) | Admitting: *Deleted

## 2020-02-15 DIAGNOSIS — M25511 Pain in right shoulder: Secondary | ICD-10-CM | POA: Diagnosis not present

## 2020-02-15 DIAGNOSIS — M6281 Muscle weakness (generalized): Secondary | ICD-10-CM

## 2020-02-15 DIAGNOSIS — G8929 Other chronic pain: Secondary | ICD-10-CM

## 2020-02-15 DIAGNOSIS — M25611 Stiffness of right shoulder, not elsewhere classified: Secondary | ICD-10-CM

## 2020-02-15 NOTE — Therapy (Signed)
[facility] [place], [place], [address] Phone: [phone]   Fax:  [phone]  Physical Therapy Treatment  Patient Details  Name: Tammy Mayo MRN: [id number] Date of Birth: 11-01-1965 Referring Provider (PT): Sanjuana Kava MD   Encounter Date: 02/15/2020   PT End of Session - 02/15/20 1439    Visit Number 12    Number of Visits 12    Date for PT Re-Evaluation 01/26/20    PT Start Time 1432    PT Stop Time 1519    PT Time Calculation (min) 47 min           Past Medical History:  Diagnosis Date   Anemia    Arthritis    Chronic back pain    lumbar 4-5 slip and radicular right leg pain   Depression    Diabetes mellitus without complication ([facility])    GERD (gastroesophageal reflux disease)    Hyperlipidemia    Hypertension    Seasonal allergies    Vitamin D deficiency    Wears glasses     Past Surgical History:  Procedure Laterality Date   BACK SURGERY     CESAREAN SECTION     x 1   CHOLECYSTECTOMY N/A 11/13/2015   Procedure: LAPAROSCOPIC CHOLECYSTECTOMY;  Surgeon: Rolm Bookbinder, MD;  Location: [facility];  Service: General;  Laterality: N/A;   DILATION AND CURETTAGE OF UTERUS     HERNIA REPAIR     umbilical hernia repair as a child   TUBAL LIGATION      There were no vitals filed for this visit.   Subjective Assessment - 02/15/20 1437    Subjective COVID-19 screen performed prior to patient entering clinic. DC today. F/U with MD 02-29-20    Pertinent History DM, HTN, lumbar surgery.    Patient Stated Goals Use right arm without pain.  Sleep on right side.    Currently in Pain? Yes    Pain Score 3     Pain Location Shoulder    Pain Orientation Right    Pain Descriptors / Indicators Aching;Sore    Pain Type Chronic pain    Pain Onset More than a month ago                             [facility] Adult PT Treatment/Exercise - 02/15/20 0001      Shoulder Exercises:  Standing   Protraction Strengthening;Right;20 reps;Theraband    Theraband Level (Shoulder Protraction) Level 2 (Red)    External Rotation Strengthening;Right;20 reps;Theraband    Theraband Level (Shoulder External Rotation) Level 2 (Red)    Internal Rotation Strengthening;Right;20 reps;Theraband    Theraband Level (Shoulder Internal Rotation) Level 2 (Red)    Extension Strengthening;Right;20 reps;Theraband    Theraband Level (Shoulder Extension) Level 2 (Red)    Retraction Strengthening;Right;20 reps;Theraband    Theraband Level (Shoulder Retraction) Level 2 (Red)      Shoulder Exercises: Pulleys   Flexion 5 minutes   Info given for home pulley unit     Modalities   Modalities Electrical Stimulation;Moist Heat      Moist Heat Therapy   Number Minutes Moist Heat 15 Minutes    Moist Heat Location Shoulder      Electrical Stimulation   Electrical Stimulation Location R shoulder    Electrical Stimulation Action premod    Electrical Stimulation Parameters 80-'150hz'  x 15 mins    Electrical Stimulation Goals Pain  PT Long Term Goals - 02/15/20 1452      PT LONG TERM GOAL #1   Title Independent with a HEP.    Time 6    Period Weeks    Status Achieved      PT LONG TERM GOAL #2   Title Active right shoulder flexion to 145 degrees so the patient can easily reach overhead.    Baseline gets up to 6/10 (09/16/16)    Time 6    Period Weeks    Status Not Met   NM 135 degrees     PT LONG TERM GOAL #3   Title Active ER to 70 degrees+ to allow for easily donning/doffing of apparel.    Time 6    Status Achieved      PT LONG TERM GOAL #4   Title Increase ROM so patient is able to reach behind back to L3.    Period Weeks    Status Achieved      PT LONG TERM GOAL #5   Title Increase right shoulder strength to a solid 4+/5 to increase stability for performance of functional activities.    Time 6    Period Weeks    Status Partially Met   NM 4/5      PT LONG TERM GOAL #6   Title Perform ADL's with pain not > 3/10.    Time 6    Period Weeks    Status Partially Met   NM 6/10 some days                Plan - 02/15/20 1800    Clinical Impression Statement Pt arrived today doing fairly well with mainly some soreness in RT shldr. She was able to perform ROM as well as strengthening exs for HEP and did well. LTGs for flexion and strength NM due to deficits.    Personal Factors and Comorbidities Comorbidity 1;Comorbidity 2    Comorbidities DM, HTN, lumbar surgery.    Examination-Participation Restrictions Other    Stability/Clinical Decision Making Evolving/Moderate complexity    Rehab Potential Good    PT Duration 6 weeks    PT Treatment/Interventions ADLs/Self Care Home Management;Cryotherapy;Electrical Stimulation;Ultrasound;Moist Heat;Therapeutic activities;Therapeutic exercise;Manual techniques;Patient/family education;Passive range of motion;Dry needling;Joint Manipulations;Vasopneumatic Device    PT Next Visit Plan DC to home program.    Consulted and Agree with Plan of Care Patient           Patient will benefit from skilled therapeutic intervention in order to improve the following deficits and impairments:  Pain, Decreased activity tolerance, Decreased strength, Decreased range of motion  Visit Diagnosis: Chronic right shoulder pain  Stiffness of right shoulder, not elsewhere classified  Muscle weakness (generalized)     Problem List Patient Active Problem List   Diagnosis Date Noted   Chronic right shoulder pain 11/22/2019   Constipation 11/22/2019   Type 2 diabetes mellitus with diabetic neuropathy, with long-term current use of insulin ([place]) 11/22/2019   Back pain 06/21/2015   HTN (hypertension) 10/23/2010   Vitamin D deficiency 10/23/2010   Hyperlipemia 10/23/2010   Plantar fasciitis 10/23/2010    Jace Dowe,CHRIS, PTA 02/15/2020, 6:14 PM  [facility] [address] [place], [place], [address] Phone: [phone]   Fax:  [phone]  Name: Tammy Mayo MRN: [id number] Date of Birth: 1966-05-20  PHYSICAL THERAPY DISCHARGE SUMMARY  Visits from Start of Care: 12.  Current functional level related to goals / functional outcomes: See above.   Remaining deficits: See  goal section.   Education / Equipment: HEP. Plan: Patient agrees to discharge.  Patient goals were partially met. Patient is being discharged due to meeting the stated rehab goals.  ?????         Mali Applegate MPT

## 2020-02-29 ENCOUNTER — Encounter: Payer: Self-pay | Admitting: Orthopaedic Surgery

## 2020-02-29 ENCOUNTER — Other Ambulatory Visit: Payer: Self-pay

## 2020-02-29 ENCOUNTER — Ambulatory Visit (INDEPENDENT_AMBULATORY_CARE_PROVIDER_SITE_OTHER): Payer: Managed Care, Other (non HMO) | Admitting: Orthopaedic Surgery

## 2020-02-29 VITALS — BP 137/96 | HR 90 | Ht 64.0 in | Wt 175.5 lb

## 2020-02-29 DIAGNOSIS — G8929 Other chronic pain: Secondary | ICD-10-CM | POA: Diagnosis not present

## 2020-02-29 DIAGNOSIS — M25511 Pain in right shoulder: Secondary | ICD-10-CM | POA: Diagnosis not present

## 2020-02-29 NOTE — Progress Notes (Signed)
Patient GY:IRSWNIOEV Tammy Mayo, female DOB:10/23/1965, 54 y.o. OJJ:[id number]  Chief Complaint  Patient presents with  . Shoulder Pain    R/ still sore/hurts to try putting arm behind back/finished PT    HPI  Tammy Mayo is Tammy 54 y.o. female who has right shoulder pain. She has completed her PT.  I have read their notes.  She is better. She has problems with extension now.  She has no new trauma.  She has no numbness.   Body mass index is 30.12 kg/m.  ROS  Review of Systems  Constitutional: Positive for activity change.  Musculoskeletal: Positive for arthralgias.  Allergic/Immunologic: Positive for environmental allergies.  All other systems reviewed and are negative.   All other systems reviewed and are negative.  The following is Tammy summary of the past history medically, past history surgically, known current medicines, social history and family history.  This information is gathered electronically by the computer from prior information and documentation.  I review this each visit and have found including this information at this point in the chart is beneficial and informative.    Past Medical History:  Diagnosis Date  . Anemia   . Arthritis   . Chronic back pain    lumbar 4-5 slip and radicular right leg pain  . Depression   . Diabetes mellitus without complication ([facility])   . GERD (gastroesophageal reflux disease)   . Hyperlipidemia   . Hypertension   . Seasonal allergies   . Vitamin D deficiency   . Wears glasses     Past Surgical History:  Procedure Laterality Date  . BACK SURGERY    . CESAREAN SECTION     x 1  . CHOLECYSTECTOMY N/Tammy 11/13/2015   Procedure: LAPAROSCOPIC CHOLECYSTECTOMY;  Surgeon: Emelia Loron, MD;  Location: [facility] OR;  Service: General;  Laterality: N/Tammy;  . DILATION AND CURETTAGE OF UTERUS    . HERNIA REPAIR     umbilical hernia repair as Tammy child  . TUBAL LIGATION      Family History  Problem Relation Age of Onset  . Stroke Mother    . Hypertension Mother   . Diabetes Mother   . Hypertension Father   . Diabetes Father   . Kidney disease Father   . Hypertension Sister     Social History Social History   Tobacco Use  . Smoking status: Never Smoker  . Smokeless tobacco: Never Used  Vaping Use  . Vaping Use: Never used  Substance Use Topics  . Alcohol use: No  . Drug use: No    No Known Allergies  Current Outpatient Medications  Medication Sig Dispense Refill  . ARIPiprazole (ABILIFY) 10 MG tablet Take 10 mg by mouth daily.    . Biotin 1000 MCG tablet Take 1,000 mcg by mouth daily.    . cetirizine (ZYRTEC) 10 MG tablet Take 10 mg by mouth daily.    . cholecalciferol (VITAMIN D) 1000 UNITS tablet Take 1,000 Units by mouth daily.    . Dapagliflozin-Metformin HCl ER (XIGDUO XR) 04-999 MG TB24 Take 1 tablet by mouth daily with supper.    . docusate sodium (COLACE) 100 MG capsule Take 100 mg by mouth daily.    . DULoxetine (CYMBALTA) 30 MG capsule Take 30 mg by mouth 2 (two) times daily.    Marland Kitchen gabapentin (NEURONTIN) 300 MG capsule Take 300 mg by mouth 2 (two) times daily.     Marland Kitchen HYDROcodone-acetaminophen (NORCO) 5-325 MG tablet Take 1 tablet by mouth daily  as needed for moderate pain. 30 tablet 0  . HYDROcodone-acetaminophen (NORCO) 5-325 MG tablet Take 1 tablet by mouth daily as needed for moderate pain. 30 tablet 0  . HYDROcodone-acetaminophen (NORCO) 5-325 MG tablet Take 1 tablet by mouth daily as needed for moderate pain. 30 tablet 0  . lisinopril (ZESTRIL) 20 MG tablet Take 20 mg by mouth daily.    Marland Kitchen omeprazole (PRILOSEC) 20 MG capsule Take 1 capsule (20 mg total) by mouth daily. 14 capsule 0  . oxymetazoline (AFRIN) 0.05 % nasal spray Place 1 spray into both nostrils 2 (two) times daily as needed for congestion (over the counter nasal spray).    Marland Kitchen OZEMPIC, 1 MG/DOSE, 2 MG/1.5ML SOPN Inject 1 mg into the skin once Tammy week.    . polyethylene glycol (MIRALAX / GLYCOLAX) 17 g packet Take 17 g by mouth daily. 14  each 5  . rosuvastatin (CRESTOR) 10 MG tablet Take 10 mg by mouth daily.    . traMADol (ULTRAM) 50 MG tablet Take 50 mg by mouth 3 (three) times daily as needed.    . traZODone (DESYREL) 50 MG tablet Take 50 mg by mouth at bedtime.    Marland Kitchen estradiol (ESTRACE) 1 MG tablet Take 1 mg by mouth daily. (Patient not taking: Reported on 02/03/2020)    . medroxyPROGESTERone (PROVERA) 2.5 MG tablet Take 2.5 mg by mouth daily. (Patient not taking: Reported on 02/03/2020)    . promethazine (PHENERGAN) 25 MG tablet Take 25 mg by mouth every 6 (six) hours as needed.  (Patient not taking: Reported on 02/03/2020)     No current facility-administered medications for this visit.     Physical Exam  Blood pressure (!) 137/96, pulse 90, height 5\' 4"  (1.626 m), weight 175 lb 8 oz (79.6 kg).  Constitutional: overall normal hygiene, normal nutrition, well developed, normal grooming, normal body habitus. Assistive device:none  Musculoskeletal: gait and station Limp none, muscle tone and strength are normal, no tremors or atrophy is present.  .  Neurological: coordination overall normal.  Deep tendon reflex/nerve stretch intact.  Sensation normal.  Cranial nerves II-XII intact.   Skin:   Normal overall no scars, lesions, ulcers or rashes. No psoriasis.  Psychiatric: Alert and oriented x 3.  Recent memory intact, remote memory unclear.  Normal mood and affect. Well groomed.  Good eye contact.  Cardiovascular: overall no swelling, no varicosities, no edema bilaterally, normal temperatures of the legs and arms, no clubbing, cyanosis and good capillary refill.  Lymphatic: palpation is normal.  She has full ROM of the right shoulder today.  NV intact.   All other systems reviewed and are negative   The patient has been educated about the nature of the problem(s) and counseled on treatment options.  The patient appeared to understand what I have discussed and is in agreement with it.  Encounter Diagnosis  Name  Primary?  . Chronic right shoulder pain Yes    PLAN Call if any problems.  Precautions discussed.  Continue current medications.   Return to clinic 1 month   Continue exercises at home.  Electronically Signed , MD 8/3/202110:00 AM

## 2020-03-22 ENCOUNTER — Ambulatory Visit: Payer: Managed Care, Other (non HMO) | Admitting: Family Medicine

## 2020-03-23 ENCOUNTER — Ambulatory Visit: Payer: Managed Care, Other (non HMO) | Admitting: Family Medicine

## 2020-03-23 ENCOUNTER — Encounter: Payer: Self-pay | Admitting: Family Medicine

## 2020-03-23 ENCOUNTER — Other Ambulatory Visit: Payer: Self-pay

## 2020-03-23 VITALS — BP 139/94 | HR 90 | Temp 97.5°F | Ht 64.0 in | Wt 171.6 lb

## 2020-03-23 DIAGNOSIS — G8929 Other chronic pain: Secondary | ICD-10-CM

## 2020-03-23 DIAGNOSIS — M545 Low back pain: Secondary | ICD-10-CM

## 2020-03-23 DIAGNOSIS — Z79899 Other long term (current) drug therapy: Secondary | ICD-10-CM

## 2020-03-23 DIAGNOSIS — E1140 Type 2 diabetes mellitus with diabetic neuropathy, unspecified: Secondary | ICD-10-CM

## 2020-03-23 DIAGNOSIS — Z794 Long term (current) use of insulin: Secondary | ICD-10-CM

## 2020-03-23 LAB — BAYER DCA HB A1C WAIVED: HB A1C (BAYER DCA - WAIVED): 6.3 % (ref <7.0)

## 2020-03-23 MED ORDER — HYDROCODONE-ACETAMINOPHEN 5-325 MG PO TABS: 1.0000 | ORAL_TABLET | Freq: Two times a day (BID) | ORAL | 0 refills | Status: DC | PRN

## 2020-03-23 NOTE — Progress Notes (Signed)
Assessment & Plan:  1. Type 2 diabetes mellitus with diabetic neuropathy, with long-term current use of insulin (HCC) Lab Results  Component Value Date   HGBA1C 6.3 03/23/2020   HGBA1C 6.3 11/18/2019   HGBA1C 7.3 (H) 11/06/2015  - Diabetes is at goal of A1c < 7. - Medications: continue current medications - Home glucose monitoring: continue monitoring - Patient is currently taking a statin. Patient is taking an ACE-inhibitor/ARB.  - Last foot exam: 03/23/20 - Last diabetic eye exam: unknown - Urine Microalbumin/Creat Ratio: 11/18/2019 - Bayer DCA Hb A1c Waived - CMP14+EGFR  2. Chronic bilateral low back pain without sciatica - Norco increased from QD to BID so patient can take it that way and STOP taking Tramadol which was already previously D/C'd. Message sent to pharmacy with prescriptions to D/C previous orders from [facility]. Urine drug screen and controlled substance agreement are up-to-date.  - HYDROcodone-acetaminophen (NORCO) 5-325 MG tablet; Take 1 tablet by mouth 2 (two) times daily as needed for moderate pain.  Dispense: 60 tablet; Refill: 0 - HYDROcodone-acetaminophen (NORCO) 5-325 MG tablet; Take 1 tablet by mouth 2 (two) times daily as needed for moderate pain.  Dispense: 60 tablet; Refill: 0 - HYDROcodone-acetaminophen (NORCO) 5-325 MG tablet; Take 1 tablet by mouth 2 (two) times daily as needed for moderate pain.  Dispense: 60 tablet; Refill: 0 - CMP14+EGFR  3. Controlled substance agreement signed - HYDROcodone-acetaminophen (NORCO) 5-325 MG tablet; Take 1 tablet by mouth 2 (two) times daily as needed for moderate pain.  Dispense: 60 tablet; Refill: 0 - HYDROcodone-acetaminophen (NORCO) 5-325 MG tablet; Take 1 tablet by mouth 2 (two) times daily as needed for moderate pain.  Dispense: 60 tablet; Refill: 0 - HYDROcodone-acetaminophen (NORCO) 5-325 MG tablet; Take 1 tablet by mouth 2 (two) times daily as needed for moderate pain.  Dispense: 60 tablet; Refill:  0   Return in about 3 months (around 06/23/2020) for follow-up of chronic medication conditions.  Hendricks Limes, MSN, APRN, FNP-C [name]  Subjective:    Patient ID: Tammy Mayo, female    DOB: 25-Feb-1966, 54 y.o.   MRN: [id number]  Patient Care Team: Loman Brooklyn, FNP as PCP - General (Family Medicine)   Chief Complaint:  Chief Complaint  Patient presents with  . Diabetes    check up of chronic medical conditions  . Shoulder Pain    Patient states she is still having right shoulder pain that has been going on since her appointment on 5/21  but it has gotten better.    HPI: Tammy Mayo is a 54 y.o. female presenting on 03/23/2020 for Diabetes (check up of chronic medical conditions) and Shoulder Pain (Patient states she is still having right shoulder pain that has been going on since her appointment on 5/21  but it has gotten better.)  Diabetes: Patient presents for follow up of diabetes. Current symptoms include: none.  Known diabetic complications: none. Medication compliance: yes. Home blood sugar records: BGs are running  consistent with Hgb A1C. Is she  on ACE inhibitor or angiotensin II receptor blocker? Yes. Is she on a statin? Yes.   Lab Results  Component Value Date   HGBA1C 6.3 03/23/2020   HGBA1C 6.3 11/18/2019   HGBA1C 7.3 (H) 11/06/2015   Lab Results  Component Value Date   LDLCALC 73 11/18/2019   CREATININE 0.76 11/18/2019     Pain assessment: Cause of pain- lumbar fusion at L4-L5 with screws and a spacer Pain location- low  back Pain on scale of 1-10- 5/10 with medication; 9/10 without medication Frequency- daily What increases pain- house chores and bending What makes pain better- Biofreeze, rest, and pain medication Effects on ADL - makes difficult Any change in general medical condition- shoulder pain improved with PT and referral to ortho  Current opioids rx- Norco 5/325 - patient misunderstood our last  visit and has continued to take tramadol in the morning and evening in addition to a Norco in the evening.  The medication has continued to be filled by her previous PCP since there were refills at the pharmacy. # meds rx- 30 Effectiveness of current meds- good Adverse reactions form pain meds- none Morphine equivalent- 5 MME/day  Pill count performed-No Last drug screen - 11/18/2019 ( high risk q31m, moderate risk q59m, low risk yearly ) Urine drug screen today- No Was the [place] reviewed- yes  If yes were their any concerning findings? - pharmacy has continued to fill prescriptions from previous PCP for tramadol  Overdose risk: 130  Opioid Risk  12/17/2019  Alcohol 3  Illegal Drugs 0  Rx Drugs 0  Alcohol 0  Illegal Drugs 0  Rx Drugs 0  Age between 16-45 years  0  History of Preadolescent Sexual Abuse 0  Psychological Disease 0  Depression 1  Opioid Risk Tool Scoring 4  Opioid Risk Interpretation Moderate Risk   Pain contract signed on: 12/17/2019  New complaints: None  Social history:  Relevant past medical, surgical, family and social history reviewed and updated as indicated. Interim medical history since our last visit reviewed.  Allergies and medications reviewed and updated.  DATA REVIEWED: CHART IN EPIC  ROS: Negative unless specifically indicated above in HPI.    Current Outpatient Medications:  .  ARIPiprazole (ABILIFY) 10 MG tablet, Take 10 mg by mouth daily., Disp: , Rfl:  .  Biotin 1000 MCG tablet, Take 1,000 mcg by mouth daily., Disp: , Rfl:  .  cetirizine (ZYRTEC) 10 MG tablet, Take 10 mg by mouth daily., Disp: , Rfl:  .  cholecalciferol (VITAMIN D) 1000 UNITS tablet, Take 1,000 Units by mouth daily., Disp: , Rfl:  .  Dapagliflozin-Metformin HCl ER (XIGDUO XR) 04-999 MG TB24, Take 1 tablet by mouth daily with supper., Disp: , Rfl:  .  docusate sodium (COLACE) 100 MG capsule, Take 100 mg by mouth daily., Disp: , Rfl:  .  DULoxetine (CYMBALTA) 30 MG  capsule, Take 30 mg by mouth 2 (two) times daily., Disp: , Rfl:  .  gabapentin (NEURONTIN) 300 MG capsule, Take 300 mg by mouth 2 (two) times daily. , Disp: , Rfl:  .  HYDROcodone-acetaminophen (NORCO) 5-325 MG tablet, Take 1 tablet by mouth daily as needed for moderate pain., Disp: 30 tablet, Rfl: 0 .  HYDROcodone-acetaminophen (NORCO) 5-325 MG tablet, Take 1 tablet by mouth daily as needed for moderate pain., Disp: 30 tablet, Rfl: 0 .  HYDROcodone-acetaminophen (NORCO) 5-325 MG tablet, Take 1 tablet by mouth daily as needed for moderate pain., Disp: 30 tablet, Rfl: 0 .  lisinopril (ZESTRIL) 20 MG tablet, Take 20 mg by mouth daily., Disp: , Rfl:  .  omeprazole (PRILOSEC) 20 MG capsule, Take 1 capsule (20 mg total) by mouth daily., Disp: 14 capsule, Rfl: 0 .  oxymetazoline (AFRIN) 0.05 % nasal spray, Place 1 spray into both nostrils 2 (two) times daily as needed for congestion (over the counter nasal spray)., Disp: , Rfl:  .  OZEMPIC, 1 MG/DOSE, 2 MG/1.5ML SOPN, Inject 1 mg into  the skin once a week., Disp: , Rfl:  .  polyethylene glycol (MIRALAX / GLYCOLAX) 17 g packet, Take 17 g by mouth daily., Disp: 14 each, Rfl: 5 .  rosuvastatin (CRESTOR) 10 MG tablet, Take 10 mg by mouth daily., Disp: , Rfl:  .  traMADol (ULTRAM) 50 MG tablet, Take 50 mg by mouth 3 (three) times daily as needed., Disp: , Rfl:  .  traZODone (DESYREL) 50 MG tablet, Take 50 mg by mouth at bedtime., Disp: , Rfl:    No Known Allergies Past Medical History:  Diagnosis Date  . Anemia   . Arthritis   . Chronic back pain    lumbar 4-5 slip and radicular right leg pain  . Depression   . Diabetes mellitus without complication ([place])   . GERD (gastroesophageal reflux disease)   . Hyperlipidemia   . Hypertension   . Seasonal allergies   . Vitamin D deficiency   . Wears glasses     Past Surgical History:  Procedure Laterality Date  . BACK SURGERY    . CESAREAN SECTION     x 1  . CHOLECYSTECTOMY N/A 11/13/2015   Procedure:  LAPAROSCOPIC CHOLECYSTECTOMY;  Surgeon: Rolm Bookbinder, MD;  Location: [facility];  Service: General;  Laterality: N/A;  . DILATION AND CURETTAGE OF UTERUS    . HERNIA REPAIR     umbilical hernia repair as a child  . TUBAL LIGATION      Social History   Socioeconomic History  . Marital status: Married    Spouse name: Not on file  . Number of children: Not on file  . Years of education: Not on file  . Highest education level: Not on file  Occupational History  . Not on file  Tobacco Use  . Smoking status: Never Smoker  . Smokeless tobacco: Never Used  Vaping Use  . Vaping Use: Never used  Substance and Sexual Activity  . Alcohol use: No  . Drug use: No  . Sexual activity: Yes    Birth control/protection: None  Other Topics Concern  . Not on file  Social History Narrative  . Not on file   Social Determinants of Health   Financial Resource Strain:   . Difficulty of Paying Living Expenses: Not on file  Food Insecurity:   . Worried About Charity fundraiser in the Last Year: Not on file  . Ran Out of Food in the Last Year: Not on file  Transportation Needs:   . Lack of Transportation (Medical): Not on file  . Lack of Transportation (Non-Medical): Not on file  Physical Activity:   . Days of Exercise per Week: Not on file  . Minutes of Exercise per Session: Not on file  Stress:   . Feeling of Stress : Not on file  Social Connections:   . Frequency of Communication with Friends and Family: Not on file  . Frequency of Social Gatherings with Friends and Family: Not on file  . Attends Religious Services: Not on file  . Active Member of Clubs or Organizations: Not on file  . Attends Archivist Meetings: Not on file  . Marital Status: Not on file  Intimate Partner Violence:   . Fear of Current or Ex-Partner: Not on file  . Emotionally Abused: Not on file  . Physically Abused: Not on file  . Sexually Abused: Not on file        Objective:    BP (!) 139/94    Pulse 90  Temp (!) 97.5 F (36.4 C) (Temporal)   Ht $R'5\' 4"'St$  (1.626 m)   Wt 171 lb 9.6 oz (77.8 kg)   LMP  (LMP Unknown)   SpO2 95%   BMI 29.46 kg/m   Wt Readings from Last 3 Encounters:  03/23/20 171 lb 9.6 oz (77.8 kg)  02/29/20 175 lb 8 oz (79.6 kg)  02/03/20 180 lb (81.6 kg)    Physical Exam Vitals reviewed.  Constitutional:      General: She is not in acute distress.    Appearance: Normal appearance. She is overweight. She is not ill-appearing, toxic-appearing or diaphoretic.  HENT:     Head: Normocephalic and atraumatic.  Eyes:     General: No scleral icterus.       Right eye: No discharge.        Left eye: No discharge.     Conjunctiva/sclera: Conjunctivae normal.  Cardiovascular:     Rate and Rhythm: Normal rate and regular rhythm.     Heart sounds: Normal heart sounds. No murmur heard.  No friction rub. No gallop.   Pulmonary:     Effort: Pulmonary effort is normal. No respiratory distress.     Breath sounds: Normal breath sounds. No stridor. No wheezing, rhonchi or rales.  Musculoskeletal:        General: Normal range of motion.     Cervical back: Normal range of motion.  Skin:    General: Skin is warm and dry.     Capillary Refill: Capillary refill takes less than 2 seconds.  Neurological:     General: No focal deficit present.     Mental Status: She is alert and oriented to person, place, and time. Mental status is at baseline.  Psychiatric:        Mood and Affect: Mood normal.        Behavior: Behavior normal.        Thought Content: Thought content normal.        Judgment: Judgment normal.    Diabetic Foot Exam - Simple   Simple Foot Form Diabetic Foot exam was performed with the following findings: Yes 03/23/2020  2:06 PM  Visual Inspection No deformities, no ulcerations, no other skin breakdown bilaterally: Yes Sensation Testing Intact to touch and monofilament testing bilaterally: Yes Pulse Check Posterior Tibialis and Dorsalis pulse intact  bilaterally: Yes Comments     No results found for: TSH Lab Results  Component Value Date   WBC 6.1 11/18/2019   HGB 13.6 11/18/2019   HCT 40.7 11/18/2019   MCV 88 11/18/2019   PLT 318 11/18/2019   Lab Results  Component Value Date   NA 147 (H) 11/18/2019   K 3.8 11/18/2019   CO2 26 11/18/2019   GLUCOSE 84 11/18/2019   BUN 4 (L) 11/18/2019   CREATININE 0.76 11/18/2019   BILITOT 0.3 11/18/2019   ALKPHOS 92 11/18/2019   AST 16 11/18/2019   ALT 13 11/18/2019   PROT 6.8 11/18/2019   ALBUMIN 4.4 11/18/2019   CALCIUM 9.5 11/18/2019   ANIONGAP 12 07/15/2017   Lab Results  Component Value Date   CHOL 131 11/18/2019   Lab Results  Component Value Date   HDL 44 11/18/2019   Lab Results  Component Value Date   LDLCALC 73 11/18/2019   Lab Results  Component Value Date   TRIG 67 11/18/2019   Lab Results  Component Value Date   CHOLHDL 3.0 11/18/2019   Lab Results  Component Value Date   HGBA1C  6.3 11/18/2019           

## 2020-03-24 LAB — CMP14+EGFR
ALT: 20 IU/L (ref 0–32)
AST: 15 IU/L (ref 0–40)
Albumin/Globulin Ratio: 1.8 (ref 1.2–2.2)
Albumin: 4.5 g/dL (ref 3.8–4.9)
Alkaline Phosphatase: 77 IU/L (ref 48–121)
BUN/Creatinine Ratio: 10 (ref 9–23)
BUN: 8 mg/dL (ref 6–24)
Bilirubin Total: 0.5 mg/dL (ref 0.0–1.2)
CO2: 26 mmol/L (ref 20–29)
Calcium: 9.7 mg/dL (ref 8.7–10.2)
Chloride: 102 mmol/L (ref 96–106)
Creatinine, Ser: 0.82 mg/dL (ref 0.57–1.00)
GFR calc Af Amer: 94 mL/min/{1.73_m2} (ref >59)
GFR calc non Af Amer: 82 mL/min/{1.73_m2} (ref >59)
Globulin, Total: 2.5 g/dL (ref 1.5–4.5)
Glucose: 75 mg/dL (ref 65–99)
Potassium: 3.8 mmol/L (ref 3.5–5.2)
Sodium: 143 mmol/L (ref 134–144)
Total Protein: 7 g/dL (ref 6.0–8.5)

## 2020-03-28 ENCOUNTER — Encounter: Payer: Self-pay | Admitting: Orthopaedic Surgery

## 2020-03-28 ENCOUNTER — Ambulatory Visit (INDEPENDENT_AMBULATORY_CARE_PROVIDER_SITE_OTHER): Payer: Managed Care, Other (non HMO) | Admitting: Orthopaedic Surgery

## 2020-03-28 ENCOUNTER — Other Ambulatory Visit: Payer: Self-pay

## 2020-03-28 VITALS — BP 138/88 | HR 87 | Ht 64.0 in | Wt 170.0 lb

## 2020-03-28 DIAGNOSIS — M25511 Pain in right shoulder: Secondary | ICD-10-CM | POA: Diagnosis not present

## 2020-03-28 DIAGNOSIS — G8929 Other chronic pain: Secondary | ICD-10-CM

## 2020-03-28 NOTE — Patient Instructions (Signed)
MRI scan has been ordered for you, we will get approval for the scan if needed, then Tammy Mayo will call you with appointment.

## 2020-03-28 NOTE — Progress Notes (Signed)
Patient Tammy Mayo, female DOB:[birth year], 54 y.o. KJZ:[id number]  Chief Complaint  Patient presents with  . Shoulder Pain    right     HPI  Tammy Mayo is a 54 y.o. female who has continued pain of the right shoulder. She has been to OT.  I have reviewed the notes.  She is not improved. I will get MRI of the right shoulder to check on rotator cuff injury.   Body mass index is 29.18 kg/m.  ROS  Review of Systems  Constitutional: Positive for activity change.  Musculoskeletal: Positive for arthralgias.  Allergic/Immunologic: Positive for environmental allergies.  All other systems reviewed and are negative.   All other systems reviewed and are negative.  The following is a summary of the past history medically, past history surgically, known current medicines, social history and family history.  This information is gathered electronically by the computer from prior information and documentation.  I review this each visit and have found including this information at this point in the chart is beneficial and informative.    Past Medical History:  Diagnosis Date  . Anemia   . Arthritis   . Chronic back pain    lumbar 4-5 slip and radicular right leg pain  . Depression   . Diabetes mellitus without complication (HCC)   . GERD (gastroesophageal reflux disease)   . Hyperlipidemia   . Hypertension   . Seasonal allergies   . Vitamin D deficiency   . Wears glasses     Past Surgical History:  Procedure Laterality Date  . BACK SURGERY    . CESAREAN SECTION     x 1  . CHOLECYSTECTOMY N/A 11/13/2015   Procedure: LAPAROSCOPIC CHOLECYSTECTOMY;  Surgeon: Emelia Loron, MD;  Location: [facility] OR;  Service: General;  Laterality: N/A;  . DILATION AND CURETTAGE OF UTERUS    . HERNIA REPAIR     umbilical hernia repair as a child  . TUBAL LIGATION      Family History  Problem Relation Age of Onset  . Stroke Mother   . Hypertension Mother   . Diabetes Mother   .  Hypertension Father   . Diabetes Father   . Kidney disease Father   . Hypertension Sister     Social History Social History   Tobacco Use  . Smoking status: Never Smoker  . Smokeless tobacco: Never Used  Vaping Use  . Vaping Use: Never used  Substance Use Topics  . Alcohol use: No  . Drug use: No    No Known Allergies  Current Outpatient Medications  Medication Sig Dispense Refill  . ARIPiprazole (ABILIFY) 10 MG tablet Take 10 mg by mouth daily.    . Biotin 1000 MCG tablet Take 1,000 mcg by mouth daily.    . cetirizine (ZYRTEC) 10 MG tablet Take 10 mg by mouth daily.    . cholecalciferol (VITAMIN D) 1000 UNITS tablet Take 1,000 Units by mouth daily.    . Dapagliflozin-Metformin HCl ER (XIGDUO XR) 04-999 MG TB24 Take 1 tablet by mouth daily with supper.    . docusate sodium (COLACE) 100 MG capsule Take 100 mg by mouth daily.    . DULoxetine (CYMBALTA) 30 MG capsule Take 30 mg by mouth 2 (two) times daily.    Marland Kitchen gabapentin (NEURONTIN) 300 MG capsule Take 300 mg by mouth 2 (two) times daily.     Melene Muller ON 04/12/2020] HYDROcodone-acetaminophen (NORCO) 5-325 MG tablet Take 1 tablet by mouth 2 (two) times daily as  needed for moderate pain. 60 tablet 0  . HYDROcodone-acetaminophen (NORCO) 5-325 MG tablet Take 1 tablet by mouth 2 (two) times daily as needed for moderate pain. 60 tablet 0  . HYDROcodone-acetaminophen (NORCO) 5-325 MG tablet Take 1 tablet by mouth 2 (two) times daily as needed for moderate pain. 60 tablet 0  . lisinopril (ZESTRIL) 20 MG tablet Take 20 mg by mouth daily.    Marland Kitchen omeprazole (PRILOSEC) 20 MG capsule Take 1 capsule (20 mg total) by mouth daily. 14 capsule 0  . oxymetazoline (AFRIN) 0.05 % nasal spray Place 1 spray into both nostrils 2 (two) times daily as needed for congestion (over the counter nasal spray).    Marland Kitchen OZEMPIC, 1 MG/DOSE, 2 MG/1.5ML SOPN Inject 1 mg into the skin once a week.    . polyethylene glycol (MIRALAX / GLYCOLAX) 17 g packet Take 17 g by  mouth daily. 14 each 5  . rosuvastatin (CRESTOR) 10 MG tablet Take 10 mg by mouth daily.    . traZODone (DESYREL) 50 MG tablet Take 50 mg by mouth at bedtime.     No current facility-administered medications for this visit.     Physical Exam  Blood pressure 138/88, pulse 87, height 5\' 4"  (1.626 m), weight 170 lb (77.1 kg).  Constitutional: overall normal hygiene, normal nutrition, well developed, normal grooming, normal body habitus. Assistive device:none  Musculoskeletal: gait and station Limp none, muscle tone and strength are normal, no tremors or atrophy is present.  .  Neurological: coordination overall normal.  Deep tendon reflex/nerve stretch intact.  Sensation normal.  Cranial nerves II-XII intact.   Skin:   Normal overall no scars, lesions, ulcers or rashes. No psoriasis.  Psychiatric: Alert and oriented x 3.  Recent memory intact, remote memory unclear.  Normal mood and affect. Well groomed.  Good eye contact.  Cardiovascular: overall no swelling, no varicosities, no edema bilaterally, normal temperatures of the legs and arms, no clubbing, cyanosis and good capillary refill.  Lymphatic: palpation is normal.  Right shoulder with good motion but pain in the extremes and pain against resisted abduction.  NV intact.  All other systems reviewed and are negative   The patient has been educated about the nature of the problem(s) and counseled on treatment options.  The patient appeared to understand what I have discussed and is in agreement with it.  Encounter Diagnosis  Name Primary?  . Chronic right shoulder pain Yes    PLAN Call if any problems.  Precautions discussed.  Continue current medications.   Return to clinic 2 weeks   Get MRI of the right shoulder.  Electronically Signed , MD 8/31/202110:21 AM

## 2020-04-10 ENCOUNTER — Encounter: Payer: Self-pay | Admitting: Family Medicine

## 2020-04-11 ENCOUNTER — Ambulatory Visit: Payer: Managed Care, Other (non HMO) | Admitting: Orthopaedic Surgery

## 2020-04-18 ENCOUNTER — Ambulatory Visit (HOSPITAL_COMMUNITY)
Admission: RE | Admit: 2020-04-18 | Discharge: 2020-04-18 | Disposition: A | Discharge status: Home / Self Care | Payer: Managed Care, Other (non HMO) | Source: Ambulatory Visit | Attending: Orthopaedic Surgery | Admitting: Orthopaedic Surgery

## 2020-04-18 ENCOUNTER — Other Ambulatory Visit: Payer: Self-pay

## 2020-04-18 DIAGNOSIS — M25511 Pain in right shoulder: Secondary | ICD-10-CM | POA: Insufficient documentation

## 2020-04-18 DIAGNOSIS — G8929 Other chronic pain: Secondary | ICD-10-CM | POA: Insufficient documentation

## 2020-04-20 ENCOUNTER — Ambulatory Visit (INDEPENDENT_AMBULATORY_CARE_PROVIDER_SITE_OTHER): Payer: Managed Care, Other (non HMO) | Admitting: Orthopaedic Surgery

## 2020-04-20 ENCOUNTER — Other Ambulatory Visit: Payer: Self-pay

## 2020-04-20 ENCOUNTER — Encounter: Payer: Self-pay | Admitting: Orthopaedic Surgery

## 2020-04-20 VITALS — BP 150/97 | Ht 64.0 in | Wt 174.0 lb

## 2020-04-20 DIAGNOSIS — G8929 Other chronic pain: Secondary | ICD-10-CM

## 2020-04-20 DIAGNOSIS — M25511 Pain in right shoulder: Secondary | ICD-10-CM

## 2020-04-20 NOTE — Progress Notes (Signed)
Patient Tammy Mayo, female DOB:October 05, 1965, 54 y.o. EUM:[id number]  Chief Complaint  Patient presents with   Shoulder Pain    right shoulder f/u    HPI  Tammy Mayo is a 54 y.o. female who has right shoulder pain.  She had MRI which showed: IMPRESSION: 1. Full-thickness retracted supraspinatus tendon tear mainly involving the mid and anterior fibers. 2. Intact long head biceps tendon and glenoid labrum. Moderate tendinopathy involving the intra-articular portion of the long head biceps tendon. 3. Mild to moderate AC joint degenerative changes and moderate subacromial spurring changes may contribute to bony impingement. 4. Moderate edema like signal changes in the lateral deltoid muscle and adjacent subcutaneous fat. Suspect this is most likely due to a recent injection or vaccination.  I have explained the findings to her.  I have recommended she see Dr. Dallas Schimke here in the office for evaluation of the shoulder and possible surgery.  She is agreeable to this.  I have independently reviewed the MRI.     Body mass index is 29.87 kg/m.  ROS  Review of Systems  Constitutional: Positive for activity change.  Musculoskeletal: Positive for arthralgias.  Allergic/Immunologic: Positive for environmental allergies.  All other systems reviewed and are negative.   All other systems reviewed and are negative.  The following is a summary of the past history medically, past history surgically, known current medicines, social history and family history.  This information is gathered electronically by the computer from prior information and documentation.  I review this each visit and have found including this information at this point in the chart is beneficial and informative.    Past Medical History:  Diagnosis Date   Anemia    Arthritis    Chronic back pain    lumbar 4-5 slip and radicular right leg pain   COVID-19 07/27/2019   Depression    Diabetes  mellitus without complication (HCC)    GERD (gastroesophageal reflux disease)    Hyperlipidemia    Hypertension    Seasonal allergies    Vitamin D deficiency    Wears glasses     Past Surgical History:  Procedure Laterality Date   BACK SURGERY  05/2015   lumbar fusion   CESAREAN SECTION     x 1   CHOLECYSTECTOMY N/A 11/13/2015   Procedure: LAPAROSCOPIC CHOLECYSTECTOMY;  Surgeon: Emelia Loron, MD;  Location: [facility] OR;  Service: General;  Laterality: N/A;   DILATION AND CURETTAGE OF UTERUS     HERNIA REPAIR     umbilical hernia repair as a child   TUBAL LIGATION      Family History  Problem Relation Age of Onset   Stroke Mother    Hypertension Mother    Diabetes Mother    Hypertension Father    Diabetes Father    Kidney disease Father    Hyperlipidemia Father    Hypertension Sister    Hyperlipidemia Sister    Hypertension Brother    Hypertension Brother    Gout Brother    Hypertension Sister     Social History Social History   Tobacco Use   Smoking status: Never Smoker   Smokeless tobacco: Never Used  Building services engineer Use: Never used  Substance Use Topics   Alcohol use: No   Drug use: No    No Known Allergies  Current Outpatient Medications  Medication Sig Dispense Refill   ARIPiprazole ER (ABILIFY MAINTENA) 300 MG PRSY prefilled syringe Inject 300 mg into the muscle.  Biotin 1000 MCG tablet Take 1,000 mcg by mouth daily.     cetirizine (ZYRTEC) 10 MG tablet Take 10 mg by mouth daily.     cholecalciferol (VITAMIN D) 1000 UNITS tablet Take 1,000 Units by mouth daily.     Dapagliflozin-Metformin HCl ER (XIGDUO XR) 04-999 MG TB24 Take 1 tablet by mouth daily with supper.     docusate sodium (COLACE) 100 MG capsule Take 100 mg by mouth daily.     DULoxetine (CYMBALTA) 30 MG capsule Take 30 mg by mouth 2 (two) times daily.     gabapentin (NEURONTIN) 300 MG capsule Take 300 mg by mouth 2 (two) times daily.       HYDROcodone-acetaminophen (NORCO) 5-325 MG tablet Take 1 tablet by mouth 2 (two) times daily as needed for moderate pain. 60 tablet 0   lisinopril (ZESTRIL) 20 MG tablet Take 20 mg by mouth daily.     omeprazole (PRILOSEC) 20 MG capsule Take 1 capsule (20 mg total) by mouth daily. 14 capsule 0   oxymetazoline (AFRIN) 0.05 % nasal spray Place 1 spray into both nostrils 2 (two) times daily as needed for congestion (over the counter nasal spray).     OZEMPIC, 1 MG/DOSE, 2 MG/1.5ML SOPN Inject 1 mg into the skin once a week.     polyethylene glycol (MIRALAX / GLYCOLAX) 17 g packet Take 17 g by mouth daily. 14 each 5   rosuvastatin (CRESTOR) 10 MG tablet Take 10 mg by mouth daily.     traZODone (DESYREL) 100 MG tablet Take 100 mg by mouth at bedtime.      No current facility-administered medications for this visit.     Physical Exam  Blood pressure (!) 150/97, height 5\' 4"  (1.626 m), weight 174 lb (78.9 kg).  Constitutional: overall normal hygiene, normal nutrition, well developed, normal grooming, normal body habitus. Assistive device:none  Musculoskeletal: gait and station Limp none, muscle tone and strength are normal, no tremors or atrophy is present.  .  Neurological: coordination overall normal.  Deep tendon reflex/nerve stretch intact.  Sensation normal.  Cranial nerves II-XII intact.   Skin:   Normal overall no scars, lesions, ulcers or rashes. No psoriasis.  Psychiatric: Alert and oriented x 3.  Recent memory intact, remote memory unclear.  Normal mood and affect. Well groomed.  Good eye contact.  Cardiovascular: overall no swelling, no varicosities, no edema bilaterally, normal temperatures of the legs and arms, no clubbing, cyanosis and good capillary refill.  Lymphatic: palpation is normal.  Painful ROM of the right shoulder but good motion.  NV intact.  All other systems reviewed and are negative   The patient has been educated about the nature of the problem(s) and  counseled on treatment options.  The patient appeared to understand what I have discussed and is in agreement with it.  Encounter Diagnosis  Name Primary?   Chronic right shoulder pain Yes    PLAN Call if any problems.  Precautions discussed.  Continue current medications.   Return to clinic to see Dr. .   Electronically Signed Dallas Schimke, MD 9/23/202110:25 AM

## 2020-04-25 ENCOUNTER — Encounter: Payer: Self-pay | Admitting: Orthopedic Surgery

## 2020-04-25 ENCOUNTER — Other Ambulatory Visit: Payer: Self-pay | Admitting: Family Medicine

## 2020-04-25 ENCOUNTER — Telehealth: Payer: Self-pay | Admitting: Orthopedic Surgery

## 2020-04-25 ENCOUNTER — Other Ambulatory Visit: Payer: Self-pay

## 2020-04-25 ENCOUNTER — Encounter: Payer: Self-pay | Admitting: *Deleted

## 2020-04-25 ENCOUNTER — Ambulatory Visit (INDEPENDENT_AMBULATORY_CARE_PROVIDER_SITE_OTHER): Payer: Managed Care, Other (non HMO) | Admitting: Orthopedic Surgery

## 2020-04-25 VITALS — BP 138/96 | HR 81 | Ht 64.0 in | Wt 170.0 lb

## 2020-04-25 DIAGNOSIS — M75121 Complete rotator cuff tear or rupture of right shoulder, not specified as traumatic: Secondary | ICD-10-CM

## 2020-04-25 NOTE — H&P (View-Only) (Signed)
New Patient Visit  Assessment: Tammy Mayo is a 54 y.o. RHD female with right shoulder rotator cuff tear, primarily supraspinatus  Plan: I had extensive discussion with Tammy Mayo in clinic today in regards to her right shoulder.  She does have a full-thickness supraspinatus tear with mild atrophy of the muscle belly.  She has previously attempted physical therapy, as well as I received steroid injection without sustained relief.  At this point in time, I do feel that she would benefit from surgical intervention.  Risks and benefits of the procedure were discussed, including, but not limited to, infection, bleeding, need for further surgery, damage to surrounding structures, stiffness, persistent pain and more severe complications associated with anesthesia.  All questions were answered and she has elected to proceed with surgery.  She will be consented for right shoulder arthroscopy, debridement, rotator cuff repair; and possible biceps tenodesis.  She is tentatively selected May 18, 2020.  I sent a message to her primary care provider to ensure that she is medically fit to proceed with surgery.  In addition, I discussed her narcotics agreement as she is currently taking hydrocodone for pain.  Specifically, I will provide postoperative pain medications for up to 6 weeks following surgery, barring any complications.  In addition, I have encouraged the patient to wean the use of the narcotics leading up to surgery, as this will allow us to provide better pain control following surgery.  All questions were answered she is amenable to this plan.  Follow-up: Return for surgery; May 18, 2020.  Subjective:  Chief Complaint  Patient presents with  . Shoulder Pain    right shoulder, 5.5/10 today, no numbness or tingling, no injuries    History of Present Illness: Tammy Mayo is a 54 y.o. RHD female who presents today for evaluation of her right shoulder.  Briefly, she has been  followed by my partner, Dr. Keeling, and worked up appropriately.  She states her pain for started approximate 1 year ago after sustaining a fall due to a syncopal episode.  The fall was related to her blood pressure medications which have since stabilized.  Since then, she has attempted physical therapy without resolution of her symptoms.  She is also had an injection in her shoulder, which did not provide any sustained relief.  She will take medications as needed.  In addition, she is taking hydrocodone, provided by her primary care provider.  At this point in time, she is interested in discussing the possibility of surgery, if it is beneficial.  Her pain is worse at night.  She also notes discomfort in the anterior aspect of her shoulder when attempting to put her arm behind her back.  He also notes some pain with overhead motions.   Review of Systems: No numbness or tingling No chest pain The shortness of breath  Medical History:  Past Medical History:  Diagnosis Date  . Anemia   . Arthritis   . Chronic back pain    lumbar 4-5 slip and radicular right leg pain  . COVID-19 07/27/2019  . Depression   . Diabetes mellitus without complication (HCC)   . GERD (gastroesophageal reflux disease)   . Hyperlipidemia   . Hypertension   . Seasonal allergies   . Vitamin D deficiency   . Wears glasses     Past Surgical History:  Procedure Laterality Date  . BACK SURGERY  05/2015   lumbar fusion  . CESAREAN SECTION     x 1  .   CHOLECYSTECTOMY N/A 11/13/2015   Procedure: LAPAROSCOPIC CHOLECYSTECTOMY;  Surgeon: Matthew Wakefield, MD;  Location: [facility] OR;  Service: General;  Laterality: N/A;  . DILATION AND CURETTAGE OF UTERUS    . HERNIA REPAIR     umbilical hernia repair as a child  . TUBAL LIGATION      Family History  Problem Relation Age of Onset  . Stroke Mother   . Hypertension Mother   . Diabetes Mother   . Hypertension Father   . Diabetes Father   . Kidney disease Father   .  Hyperlipidemia Father   . Hypertension Sister   . Hyperlipidemia Sister   . Hypertension Brother   . Hypertension Brother   . Gout Brother   . Hypertension Sister    Social History   Tobacco Use  . Smoking status: Never Smoker  . Smokeless tobacco: Never Used  Vaping Use  . Vaping Use: Never used  Substance Use Topics  . Alcohol use: No  . Drug use: No    No Known Allergies  Current Meds  Medication Sig  . ARIPiprazole ER (ABILIFY MAINTENA) 300 MG PRSY prefilled syringe Inject 300 mg into the muscle.  . Biotin 1000 MCG tablet Take 1,000 mcg by mouth daily.  . cetirizine (ZYRTEC) 10 MG tablet Take 10 mg by mouth daily.  . cholecalciferol (VITAMIN D) 1000 UNITS tablet Take 1,000 Units by mouth daily.  . Dapagliflozin-Metformin HCl ER (XIGDUO XR) 04-999 MG TB24 Take 1 tablet by mouth daily with supper.  . docusate sodium (COLACE) 100 MG capsule Take 100 mg by mouth daily.  . DULoxetine (CYMBALTA) 30 MG capsule Take 30 mg by mouth 2 (two) times daily.  . gabapentin (NEURONTIN) 300 MG capsule Take 300 mg by mouth 2 (two) times daily.   . HYDROcodone-acetaminophen (NORCO) 5-325 MG tablet Take 1 tablet by mouth 2 (two) times daily as needed for moderate pain.  . lisinopril (ZESTRIL) 20 MG tablet Take 20 mg by mouth daily.  . omeprazole (PRILOSEC) 20 MG capsule Take 1 capsule (20 mg total) by mouth daily.  . oxymetazoline (AFRIN) 0.05 % nasal spray Place 1 spray into both nostrils 2 (two) times daily as needed for congestion (over the counter nasal spray).  . OZEMPIC, 1 MG/DOSE, 2 MG/1.5ML SOPN Inject 1 mg into the skin once a week.  . polyethylene glycol (MIRALAX / GLYCOLAX) 17 g packet Take 17 g by mouth daily.  . rosuvastatin (CRESTOR) 10 MG tablet Take 10 mg by mouth daily.  . traZODone (DESYREL) 100 MG tablet Take 100 mg by mouth at bedtime.     Objective: BP (!) 138/96   Pulse 81   Ht 5' 4" (1.626 m)   Wt 170 lb (77.1 kg)   LMP  (LMP Unknown)   BMI 29.18 kg/m    Physical Exam: Alert and oriented in no acute distress. No increased work of breathing on room air  Evaluation of the right arm demonstrates no obvious deformity.  No muscle atrophy is appreciated.  She does have tenderness to palpation over the anterior shoulder.  Mild tenderness palpation along the biceps tendon.  Active forward elevation to 120 degrees.  Passive forward flexion to 150 degrees with some discomfort.  Internal rotation to her lumbar spine.  External rotation at her side to 35 degrees.  Some discomfort with empty can testing.  4/5 strength in the supraspinatus.  5/5 strength in the infraspinatus.  Negative belly press.  Positive O'Brien's.  Mildly positive speeds testing in   both the supinated and pronated position.    IMAGING: I personally reviewed the following images:  X-rays of the right shoulder were reviewed in clinic today and demonstrates maintenance of the glenohumeral space.  There is no proximal humeral migration.  AC joint arthritis is noted.  MRI of the right shoulder was reviewed in clinic today demonstrates a full-thickness tear of the supraspinatus with some retraction.  Mild atrophy is appreciated within the supraspinatus muscle belly.  No orders of the defined types were placed in this encounter.     Merilynn Haydu A Harshita Bernales, MD  04/25/2020 12:11 PM   

## 2020-04-25 NOTE — Patient Instructions (Addendum)
We will plan for surgery May 18, 2020  Right shoulder arthroscopy, debridement, rotator cuff repair; possible biceps tenodesis

## 2020-04-25 NOTE — Progress Notes (Signed)
New Patient Visit  Assessment: Tammy Mayo is a 54 y.o. RHD female with right shoulder rotator cuff tear, primarily supraspinatus  Plan: I had extensive discussion with Tammy Mayo in clinic today in regards to her right shoulder.  She does have a full-thickness supraspinatus tear with mild atrophy of the muscle belly.  She has previously attempted physical therapy, as well as I received steroid injection without sustained relief.  At this point in time, I do feel that she would benefit from surgical intervention.  Risks and benefits of the procedure were discussed, including, but not limited to, infection, bleeding, need for further surgery, damage to surrounding structures, stiffness, persistent pain and more severe complications associated with anesthesia.  All questions were answered and she has elected to proceed with surgery.  She will be consented for right shoulder arthroscopy, debridement, rotator cuff repair; and possible biceps tenodesis.  She is tentatively selected May 18, 2020.  I sent a message to her primary care provider to ensure that she is medically fit to proceed with surgery.  In addition, I discussed her narcotics agreement as she is currently taking hydrocodone for pain.  Specifically, I will provide postoperative pain medications for up to 6 weeks following surgery, barring any complications.  In addition, I have encouraged the patient to wean the use of the narcotics leading up to surgery, as this will allow [place] to provide better pain control following surgery.  All questions were answered she is amenable to this plan.  Follow-up: Return for surgery; May 18, 2020.  Subjective:  Chief Complaint  Patient presents with  . Shoulder Pain    right shoulder, 5.5/10 today, no numbness or tingling, no injuries    History of Present Illness: Tammy Mayo is a 54 y.o. RHD female who presents today for evaluation of her right shoulder.  Briefly, she has been  followed by my partner, Dr. Hilda Lias, and worked up appropriately.  She states her pain for started approximate 1 year ago after sustaining a fall due to a syncopal episode.  The fall was related to her blood pressure medications which have since stabilized.  Since then, she has attempted physical therapy without resolution of her symptoms.  She is also had an injection in her shoulder, which did not provide any sustained relief.  She will take medications as needed.  In addition, she is taking hydrocodone, provided by her primary care provider.  At this point in time, she is interested in discussing the possibility of surgery, if it is beneficial.  Her pain is worse at night.  She also notes discomfort in the anterior aspect of her shoulder when attempting to put her arm behind her back.  He also notes some pain with overhead motions.   Review of Systems: No numbness or tingling No chest pain The shortness of breath  Medical History:  Past Medical History:  Diagnosis Date  . Anemia   . Arthritis   . Chronic back pain    lumbar 4-5 slip and radicular right leg pain  . COVID-19 07/27/2019  . Depression   . Diabetes mellitus without complication (HCC)   . GERD (gastroesophageal reflux disease)   . Hyperlipidemia   . Hypertension   . Seasonal allergies   . Vitamin D deficiency   . Wears glasses     Past Surgical History:  Procedure Laterality Date  . BACK SURGERY  05/2015   lumbar fusion  . CESAREAN SECTION     x 1  .  CHOLECYSTECTOMY N/A 11/13/2015   Procedure: LAPAROSCOPIC CHOLECYSTECTOMY;  Surgeon: Emelia Loron, MD;  Location: [facility] OR;  Service: General;  Laterality: N/A;  . DILATION AND CURETTAGE OF UTERUS    . HERNIA REPAIR     umbilical hernia repair as a child  . TUBAL LIGATION      Family History  Problem Relation Age of Onset  . Stroke Mother   . Hypertension Mother   . Diabetes Mother   . Hypertension Father   . Diabetes Father   . Kidney disease Father   .  Hyperlipidemia Father   . Hypertension Sister   . Hyperlipidemia Sister   . Hypertension Brother   . Hypertension Brother   . Gout Brother   . Hypertension Sister    Social History   Tobacco Use  . Smoking status: Never Smoker  . Smokeless tobacco: Never Used  Vaping Use  . Vaping Use: Never used  Substance Use Topics  . Alcohol use: No  . Drug use: No    No Known Allergies  Current Meds  Medication Sig  . ARIPiprazole ER (ABILIFY MAINTENA) 300 MG PRSY prefilled syringe Inject 300 mg into the muscle.  . Biotin 1000 MCG tablet Take 1,000 mcg by mouth daily.  . cetirizine (ZYRTEC) 10 MG tablet Take 10 mg by mouth daily.  . cholecalciferol (VITAMIN D) 1000 UNITS tablet Take 1,000 Units by mouth daily.  . Dapagliflozin-Metformin HCl ER (XIGDUO XR) 04-999 MG TB24 Take 1 tablet by mouth daily with supper.  . docusate sodium (COLACE) 100 MG capsule Take 100 mg by mouth daily.  . DULoxetine (CYMBALTA) 30 MG capsule Take 30 mg by mouth 2 (two) times daily.  Marland Kitchen gabapentin (NEURONTIN) 300 MG capsule Take 300 mg by mouth 2 (two) times daily.   Marland Kitchen HYDROcodone-acetaminophen (NORCO) 5-325 MG tablet Take 1 tablet by mouth 2 (two) times daily as needed for moderate pain.  Marland Kitchen lisinopril (ZESTRIL) 20 MG tablet Take 20 mg by mouth daily.  Marland Kitchen omeprazole (PRILOSEC) 20 MG capsule Take 1 capsule (20 mg total) by mouth daily.  Marland Kitchen oxymetazoline (AFRIN) 0.05 % nasal spray Place 1 spray into both nostrils 2 (two) times daily as needed for congestion (over the counter nasal spray).  Marland Kitchen OZEMPIC, 1 MG/DOSE, 2 MG/1.5ML SOPN Inject 1 mg into the skin once a week.  . polyethylene glycol (MIRALAX / GLYCOLAX) 17 g packet Take 17 g by mouth daily.  . rosuvastatin (CRESTOR) 10 MG tablet Take 10 mg by mouth daily.  . traZODone (DESYREL) 100 MG tablet Take 100 mg by mouth at bedtime.     Objective: BP (!) 138/96   Pulse 81   Ht 5\' 4"  (1.626 m)   Wt 170 lb (77.1 kg)   LMP  (LMP Unknown)   BMI 29.18 kg/m    Physical Exam: Alert and oriented in no acute distress. No increased work of breathing on room air  Evaluation of the right arm demonstrates no obvious deformity.  No muscle atrophy is appreciated.  She does have tenderness to palpation over the anterior shoulder.  Mild tenderness palpation along the biceps tendon.  Active forward elevation to 120 degrees.  Passive forward flexion to 150 degrees with some discomfort.  Internal rotation to her lumbar spine.  External rotation at her side to 35 degrees.  Some discomfort with empty can testing.  4/5 strength in the supraspinatus.  5/5 strength in the infraspinatus.  Negative belly press.  Positive O'Brien's.  Mildly positive speeds testing in  both the supinated and pronated position.    IMAGING: I personally reviewed the following images:  X-rays of the right shoulder were reviewed in clinic today and demonstrates maintenance of the glenohumeral space.  There is no proximal humeral migration.  AC joint arthritis is noted.  MRI of the right shoulder was reviewed in clinic today demonstrates a full-thickness tear of the supraspinatus with some retraction.  Mild atrophy is appreciated within the supraspinatus muscle belly.  No orders of the defined types were placed in this encounter.     Oliver Barre, MD  04/25/2020 12:11 PM

## 2020-04-25 NOTE — Telephone Encounter (Signed)
I called the start the case for the shoulder surgery and spoke with Landa L. At [facility]   The case number is [id number] and it is pending review. I will fax clinicals to [phone].  Information was faxed today.

## 2020-05-01 NOTE — Telephone Encounter (Signed)
I called to check the status of the pre-authorization for this surgery and spoke with Latoya and she had to look into the case to see about the status of the case.   It was showing as denied and that we could appeal the case for her to get the shoulder replacement.

## 2020-05-01 NOTE — Telephone Encounter (Signed)
I was then transferred to [facility] and she transferred me to [facility] and then it went to something with Laurette Schimke. Will call again later.

## 2020-05-01 NOTE — Telephone Encounter (Signed)
Toniann Tammy Mayo showed me how to check on [employer] and it shows pending as of today.

## 2020-05-02 ENCOUNTER — Other Ambulatory Visit: Payer: Self-pay | Admitting: Family Medicine

## 2020-05-02 NOTE — Telephone Encounter (Signed)
-----   Message from [facility] May, RT sent at 05/02/2020  8:22 AM EDT ----- Regarding: Sugery Denied Shoulder surgery denied per the following.    Francesco Runner, will you call [employer] and see what our options are?  See if peer to peer is an option?    Based on [employer] Comprehensive Musculoskeletal Management Guideline, CMM 315: Shoulder Surgery-Arthroscopic and Open Procedures (type of surgery to your shoulder), we are unable to approve the requested procedure. Shoulder surgery is supported when physical examination of the shoulder demonstrates functionally limited range of motion or measurable loss of strength of the rotator cuff musculature as compared to the non-involved side and one or more of the following positive orthopedic tests: 1) Drop Arm Test, 2) Painful Arc Test, 3) Jobe or Empty Can Test, 4) External Rotation Lag Sign (Dropping Sign), 5) Internal Rotator Lag Sign, 6) Lift-Off Test, 7) Bear Hug Test, 8) Belly-Press Test (Napoleon), 9) Neer Impingement Test, 10) Hawkins-Kennedy Impingement Test, and/or 11) Hornblower Test (Patte). The clinical information provided does not describe these examination findings and, therefore, the requested procedure is not indicated at this time. RT  [phone] 1 Arthroscopy, shoulder, surgical; with rotator cuff repair Denied  Following consideration of the original adverse decision, the initial decision is upheld with the same rationale.# RT

## 2020-05-02 NOTE — Telephone Encounter (Signed)
I called and spoke with Tammy Mayo at [facility]. She reports the case is denied.   The next step is a peer to peer. You will receive a call from [name] around 9:15 am tomorrow for a recosideration.  I have called the patient and she is aware we are fighting to make sure she has the surgery. FYI.

## 2020-05-03 ENCOUNTER — Other Ambulatory Visit: Payer: Self-pay | Admitting: Orthopedic Surgery

## 2020-05-03 ENCOUNTER — Other Ambulatory Visit: Payer: Self-pay | Admitting: Radiology

## 2020-05-03 DIAGNOSIS — M75121 Complete rotator cuff tear or rupture of right shoulder, not specified as traumatic: Secondary | ICD-10-CM

## 2020-05-03 NOTE — Telephone Encounter (Signed)
Oliver Barre, MD  May, Wendy, RT; Bellflower, Willeen Niece, CMA Regarding this case - just had peer to peer and received approval. Code is [id number].   I was told the clinic would receive follow up information as confirmation, including the approval code.   Thanks for setting this up. Everything should be good to proceed. Could you please contact Ms. Pound to let her know.   Thanks  Mark    Patient aware of the surgery and she is aware she will get a call from the hospital about her covid test and everything else. No other concerns.

## 2020-05-05 ENCOUNTER — Ambulatory Visit (INDEPENDENT_AMBULATORY_CARE_PROVIDER_SITE_OTHER): Payer: Managed Care, Other (non HMO)

## 2020-05-05 ENCOUNTER — Other Ambulatory Visit: Payer: Self-pay

## 2020-05-05 DIAGNOSIS — Z23 Encounter for immunization: Secondary | ICD-10-CM

## 2020-05-10 ENCOUNTER — Other Ambulatory Visit: Payer: Self-pay | Admitting: Family Medicine

## 2020-05-15 NOTE — Patient Instructions (Signed)
Tammy Mayo  05/15/2020     @   Your procedure is scheduled on  05/18/2020.  Report to [facility] at  [phone]  A.M.  Call this number if you have problems the morning of surgery:  [phone]   Remember:  Do not eat or drink after midnight.                        Take these medicines the morning of surgery with A SIP OF WATER  Cymbalta, gabapentin, omeprazole. Drink your carb drink at 0215 am. DO NOT sip it, drink down quickly. Nothing else to drink after that. DO NOT take any medications for diabetes the morning of your procedure.    Do not wear jewelry, make-up or nail polish.  Do not wear lotions, powders, or perfumes, or deodorant. Please brush your teeth.  Do not shave 48 hours prior to surgery.  Men may shave face and neck.  Do not bring valuables to the hospital.  [facility] is not responsible for any belongings or valuables.  Contacts, dentures or bridgework may not be worn into surgery.  Leave your suitcase in the car.  After surgery it may be brought to your room.  For patients admitted to the hospital, discharge time will be determined by your treatment team.  Patients discharged the day of surgery will not be allowed to drive home.   Name and phone number of your driver:   family Special instructions:  DO NOT smoke the morning of your procedure.  Please read over the following fact sheets that you were given. Anesthesia Post-op Instructions and Care and Recovery After Surgery       Surgery for Rotator Cuff Tear, Care After This sheet gives you information about how to care for yourself after your procedure. Your health care provider may also give you more specific instructions. If you have problems or questions, contact your health care provider. What can I expect after the procedure? After the procedure, it is common to have:  Swelling.  Pain.  Stiffness.  Tenderness. Follow these instructions at home: If you  have a sling or a shoulder immobilizer:  Wear it as told by your health care provider. Remove it only as told by your health care provider.  Loosen it if your fingers tingle, become numb, or turn cold and blue.  Keep it clean. Bathing  Do not take baths, swim, or use a hot tub until your health care provider approves. Ask your health care provider if you may take showers. You may only be allowed to take sponge baths.  Keep your bandage (dressing) dry until your health care provider says it can be removed.  If your sling or shoulder immobilizer is not waterproof: ? Do not let it get wet. ? Remove it when you take a bath or shower as told by your health care provider. Once the sling or shoulder immobilizer is removed, try not to move your shoulder until your health care provider says that you can. Incision care   Follow instructions from your health care provider about how to take care of your incision. Make sure you: ? Wash your hands with soap and water before and after you change your dressing. If soap and water are not available, use hand sanitizer. ? Change your dressing as told by your health care provider. ? Leave stitches (sutures), skin glue, or adhesive strips in place. These  skin closures may need to stay in place for 2 weeks or longer. If adhesive strip edges start to loosen and curl up, you may trim the loose edges. Do not remove adhesive strips completely unless your health care provider tells you to do that.  Check your incision area every day for signs of infection. Check for: ? More redness, swelling, or pain. ? More fluid or blood. ? Warmth. ? Pus or a bad smell. Managing pain, stiffness, and swelling   If directed, put ice on your shoulder area. ? Put ice in a plastic bag. ? Place a towel between your skin and the bag. ? Leave the ice on for 20 minutes, 2-3 times a day.  Move your fingers often to reduce stiffness and swelling.  Raise (elevate) your upper body  on pillows when you lie down and when you sleep. ? Do not sleep on the front of your body (abdomen). ? Do not sleep on the side that your surgery was performed on. Medicines  Take over-the-counter and prescription medicines only as told by your health care provider.  Ask your health care provider if the medicine prescribed to you: ? Requires you to avoid driving or using heavy machinery. ? Can cause constipation. You may need to take actions to prevent or treat constipation, such as:  Drink enough fluid to keep your urine pale yellow.  Take over-the-counter or prescription medicines.  Eat foods that are high in fiber, such as beans, whole grains, and fresh fruits and vegetables.  Limit foods that are high in fat and processed sugars, such as fried or sweet foods. Driving  Do not drive for 24 hours if you were given a sedative during your procedure.  Do not drive while wearing a sling or a shoulder immobilizer. Ask your health care provider when it is safe to drive. Activity  Do not use your arm to support your body weight until your health care provider says that you can.  Do not lift or hold anything with your arm until your health care provider approves.  Return to your normal activities as told by your health care provider. Ask your health care provider what activities are safe for you.  Do exercises as told by your health care provider. General instructions  Do not use any products that contain nicotine or tobacco, such as cigarettes, e-cigarettes, and chewing tobacco. These can delay healing after surgery. If you need help quitting, ask your health care provider.  Keep all follow-up visits as told by your health care provider. This is important. Contact a health care provider if:  You have a fever.  You have more redness, swelling, or pain around your incision.  You have more fluid or blood coming from your incision.  Your incision feels warm to the touch.  You  have pus or a bad smell coming from your incision.  You have pain that gets worse or does not get better with medicine. Get help right away if:  You have severe pain.  You lose feeling in your arm or hand.  Your hand or fingers turn very pale or blue. Summary  If you have a sling, wear it as told by your health care provider. Remove it only as told by your health care provider.  Change your dressing as told by your health care provider. Check the incision area every day for signs of infection.  If directed, put ice on your shoulder area 2-3 times a day.  Do not use  your arm to lift anything or to support your body weight until your health care provider says that you can. This information is not intended to replace advice given to you by your health care provider. Make sure you discuss any questions you have with your health care provider. Document Revised: 04/20/2018 Document Reviewed: 04/22/2018 Elsevier Patient Education  2020 [employer]  General Anesthesia, Adult, Care After This sheet gives you information about how to care for yourself after your procedure. Your health care provider may also give you more specific instructions. If you have problems or questions, contact your health care provider. What can I expect after the procedure? After the procedure, the following side effects are common:  Pain or discomfort at the IV site.  Nausea.  Vomiting.  Sore throat.  Trouble concentrating.  Feeling cold or chills.  Weak or tired.  Sleepiness and fatigue.  Soreness and body aches. These side effects can affect parts of the body that were not involved in surgery. Follow these instructions at home:  For at least 24 hours after the procedure:  Have a responsible adult stay with you. It is important to have someone help care for you until you are awake and alert.  Rest as needed.  Do not: ? Participate in activities in which you could fall or become  injured. ? Drive. ? Use heavy machinery. ? Drink alcohol. ? Take sleeping pills or medicines that cause drowsiness. ? Make important decisions or sign legal documents. ? Take care of children on your own. Eating and drinking  Follow any instructions from your health care provider about eating or drinking restrictions.  When you feel hungry, start by eating small amounts of foods that are soft and easy to digest (bland), such as toast. Gradually return to your regular diet.  Drink enough fluid to keep your urine pale yellow.  If you vomit, rehydrate by drinking water, juice, or clear broth. General instructions  If you have sleep apnea, surgery and certain medicines can increase your risk for breathing problems. Follow instructions from your health care provider about wearing your sleep device: ? Anytime you are sleeping, including during daytime naps. ? While taking prescription pain medicines, sleeping medicines, or medicines that make you drowsy.  Return to your normal activities as told by your health care provider. Ask your health care provider what activities are safe for you.  Take over-the-counter and prescription medicines only as told by your health care provider.  If you smoke, do not smoke without supervision.  Keep all follow-up visits as told by your health care provider. This is important. Contact a health care provider if:  You have nausea or vomiting that does not get better with medicine.  You cannot eat or drink without vomiting.  You have pain that does not get better with medicine.  You are unable to pass urine.  You develop a skin rash.  You have a fever.  You have redness around your IV site that gets worse. Get help right away if:  You have difficulty breathing.  You have chest pain.  You have blood in your urine or stool, or you vomit blood. Summary  After the procedure, it is common to have a sore throat or nausea. It is also common to  feel tired.  Have a responsible adult stay with you for the first 24 hours after general anesthesia. It is important to have someone help care for you until you are awake and alert.  When you  feel hungry, start by eating small amounts of foods that are soft and easy to digest (bland), such as toast. Gradually return to your regular diet.  Drink enough fluid to keep your urine pale yellow.  Return to your normal activities as told by your health care provider. Ask your health care provider what activities are safe for you. This information is not intended to replace advice given to you by your health care provider. Make sure you discuss any questions you have with your health care provider. Document Revised: 07/18/2017 Document Reviewed: 02/28/2017 Elsevier Patient Education  2020 [employer]. How to Use Chlorhexidine for Bathing Chlorhexidine gluconate (CHG) is a germ-killing (antiseptic) solution that is used to clean the skin. It can get rid of the bacteria that normally live on the skin and can keep them away for about 24 hours. To clean your skin with CHG, you may be given:  A CHG solution to use in the shower or as part of a sponge bath.  A prepackaged cloth that contains CHG. Cleaning your skin with CHG may help lower the risk for infection:  While you are staying in the intensive care unit of the hospital.  If you have a vascular access, such as a central line, to provide short-term or long-term access to your veins.  If you have a catheter to drain urine from your bladder.  If you are on a ventilator. A ventilator is a machine that helps you breathe by moving air in and out of your lungs.  After surgery. What are the risks? Risks of using CHG include:  A skin reaction.  Hearing loss, if CHG gets in your ears.  Eye injury, if CHG gets in your eyes and is not rinsed out.  The CHG product catching fire. Make sure that you avoid smoking and flames after applying CHG to  your skin. Do not use CHG:  If you have a chlorhexidine allergy or have previously reacted to chlorhexidine.  On babies younger than 25 months of age. How to use CHG solution  Use CHG only as told by your health care provider, and follow the instructions on the label.  Use the full amount of CHG as directed. Usually, this is one bottle. During a shower Follow these steps when using CHG solution during a shower (unless your health care provider gives you different instructions): 1. Start the shower. 2. Use your normal soap and shampoo to wash your face and hair. 3. Turn off the shower or move out of the shower stream. 4. Pour the CHG onto a clean washcloth. Do not use any type of brush or rough-edged sponge. 5. Starting at your neck, lather your body down to your toes. Make sure you follow these instructions: ? If you will be having surgery, pay special attention to the part of your body where you will be having surgery. Scrub this area for at least 1 minute. ? Do not use CHG on your head or face. If the solution gets into your ears or eyes, rinse them well with water. ? Avoid your genital area. ? Avoid any areas of skin that have broken skin, cuts, or scrapes. ? Scrub your back and under your arms. Make sure to wash skin folds. 6. Let the lather sit on your skin for 1-2 minutes or as long as told by your health care provider. 7. Thoroughly rinse your entire body in the shower. Make sure that all body creases and crevices are rinsed well. 8. Dry  off with a clean towel. Do not put any substances on your body afterward--such as powder, lotion, or perfume--unless you are told to do so by your health care provider. Only use lotions that are recommended by the manufacturer. 9. Put on clean clothes or pajamas. 10. If it is the night before your surgery, sleep in clean sheets.  During a sponge bath Follow these steps when using CHG solution during a sponge bath (unless your health care provider  gives you different instructions): 1. Use your normal soap and shampoo to wash your face and hair. 2. Pour the CHG onto a clean washcloth. 3. Starting at your neck, lather your body down to your toes. Make sure you follow these instructions: ? If you will be having surgery, pay special attention to the part of your body where you will be having surgery. Scrub this area for at least 1 minute. ? Do not use CHG on your head or face. If the solution gets into your ears or eyes, rinse them well with water. ? Avoid your genital area. ? Avoid any areas of skin that have broken skin, cuts, or scrapes. ? Scrub your back and under your arms. Make sure to wash skin folds. 4. Let the lather sit on your skin for 1-2 minutes or as long as told by your health care provider. 5. Using a different clean, wet washcloth, thoroughly rinse your entire body. Make sure that all body creases and crevices are rinsed well. 6. Dry off with a clean towel. Do not put any substances on your body afterward--such as powder, lotion, or perfume--unless you are told to do so by your health care provider. Only use lotions that are recommended by the manufacturer. 7. Put on clean clothes or pajamas. 8. If it is the night before your surgery, sleep in clean sheets. How to use CHG prepackaged cloths  Only use CHG cloths as told by your health care provider, and follow the instructions on the label.  Use the CHG cloth on clean, dry skin.  Do not use the CHG cloth on your head or face unless your health care provider tells you to.  When washing with the CHG cloth: ? Avoid your genital area. ? Avoid any areas of skin that have broken skin, cuts, or scrapes. Before surgery Follow these steps when using a CHG cloth to clean before surgery (unless your health care provider gives you different instructions): 1. Using the CHG cloth, vigorously scrub the part of your body where you will be having surgery. Scrub using a back-and-forth  motion for 3 minutes. The area on your body should be completely wet with CHG when you are done scrubbing. 2. Do not rinse. Discard the cloth and let the area air-dry. Do not put any substances on the area afterward, such as powder, lotion, or perfume. 3. Put on clean clothes or pajamas. 4. If it is the night before your surgery, sleep in clean sheets.  For general bathing Follow these steps when using CHG cloths for general bathing (unless your health care provider gives you different instructions). 1. Use a separate CHG cloth for each area of your body. Make sure you wash between any folds of skin and between your fingers and toes. Wash your body in the following order, switching to a new cloth after each step: ? The front of your neck, shoulders, and chest. ? Both of your arms, under your arms, and your hands. ? Your stomach and groin area, avoiding the  genitals. ? Your right leg and foot. ? Your left leg and foot. ? The back of your neck, your back, and your buttocks. 2. Do not rinse. Discard the cloth and let the area air-dry. Do not put any substances on your body afterward--such as powder, lotion, or perfume--unless you are told to do so by your health care provider. Only use lotions that are recommended by the manufacturer. 3. Put on clean clothes or pajamas. Contact a health care provider if:  Your skin gets irritated after scrubbing.  You have questions about using your solution or cloth. Get help right away if:  Your eyes become very red or swollen.  Your eyes itch badly.  Your skin itches badly and is red or swollen.  Your hearing changes.  You have trouble seeing.  You have swelling or tingling in your mouth or throat.  You have trouble breathing.  You swallow any chlorhexidine. Summary  Chlorhexidine gluconate (CHG) is a germ-killing (antiseptic) solution that is used to clean the skin. Cleaning your skin with CHG may help to lower your risk for infection.  You  may be given CHG to use for bathing. It may be in a bottle or in a prepackaged cloth to use on your skin. Carefully follow your health care provider's instructions and the instructions on the product label.  Do not use CHG if you have a chlorhexidine allergy.  Contact your health care provider if your skin gets irritated after scrubbing. This information is not intended to replace advice given to you by your health care provider. Make sure you discuss any questions you have with your health care provider. Document Revised: 10/01/2018 Document Reviewed: 06/12/2017 Elsevier Patient Education  2020 [employer].

## 2020-05-16 ENCOUNTER — Other Ambulatory Visit (HOSPITAL_COMMUNITY)
Admission: RE | Admit: 2020-05-16 | Discharge: 2020-05-16 | Disposition: A | Discharge status: Home / Self Care | Payer: Managed Care, Other (non HMO) | Source: Ambulatory Visit | Attending: Orthopedic Surgery | Admitting: Orthopedic Surgery

## 2020-05-16 ENCOUNTER — Other Ambulatory Visit: Payer: Self-pay

## 2020-05-16 ENCOUNTER — Encounter (HOSPITAL_COMMUNITY)
Admission: RE | Admit: 2020-05-16 | Discharge: 2020-05-16 | Disposition: A | Discharge status: Home / Self Care | Payer: Managed Care, Other (non HMO) | Source: Ambulatory Visit | Attending: Orthopedic Surgery | Admitting: Orthopedic Surgery

## 2020-05-16 DIAGNOSIS — Z01818 Encounter for other preprocedural examination: Secondary | ICD-10-CM | POA: Diagnosis present

## 2020-05-16 DIAGNOSIS — Z20822 Contact with and (suspected) exposure to covid-19: Secondary | ICD-10-CM | POA: Insufficient documentation

## 2020-05-16 LAB — BASIC METABOLIC PANEL
Anion gap: 10 (ref 5–15)
BUN: 8 mg/dL (ref 6–20)
CO2: 29 mmol/L (ref 22–32)
Calcium: 9.6 mg/dL (ref 8.9–10.3)
Chloride: 102 mmol/L (ref 98–111)
Creatinine, Ser: 0.82 mg/dL (ref 0.44–1.00)
GFR, Estimated: >60 mL/min (ref >60)
Glucose, Bld: 106 mg/dL — ABNORMAL HIGH (ref 70–99)
Potassium: 3.4 mmol/L — ABNORMAL LOW (ref 3.5–5.1)
Sodium: 141 mmol/L (ref 135–145)

## 2020-05-16 LAB — CBC
HCT: 40.3 % (ref 36.0–46.0)
Hemoglobin: 13.2 g/dL (ref 12.0–15.0)
MCH: 29.7 pg (ref 26.0–34.0)
MCHC: 32.8 g/dL (ref 30.0–36.0)
MCV: 90.6 fL (ref 80.0–100.0)
Platelets: 231 10*3/uL (ref 150–400)
RBC: 4.45 MIL/uL (ref 3.87–5.11)
RDW: 12.1 % (ref 11.5–15.5)
WBC: 5.2 10*3/uL (ref 4.0–10.5)
nRBC: 0 % (ref 0.0–0.2)

## 2020-05-16 LAB — HCG, SERUM, QUALITATIVE: Preg, Serum: NEGATIVE

## 2020-05-16 LAB — SARS CORONAVIRUS 2 (TAT 6-24 HRS): SARS Coronavirus 2: NEGATIVE

## 2020-05-17 LAB — HEMOGLOBIN A1C
Hgb A1c MFr Bld: 6.3 % — ABNORMAL HIGH (ref 4.8–5.6)
Mean Plasma Glucose: 134 mg/dL

## 2020-05-17 NOTE — Pre-Procedure Instructions (Signed)
Hgba1c routed to PCP. 

## 2020-05-18 ENCOUNTER — Ambulatory Visit (HOSPITAL_COMMUNITY): Payer: Managed Care, Other (non HMO) | Admitting: Certified Registered"

## 2020-05-18 ENCOUNTER — Encounter (HOSPITAL_COMMUNITY): Payer: Self-pay | Admitting: Orthopedic Surgery

## 2020-05-18 ENCOUNTER — Encounter (HOSPITAL_COMMUNITY)
Admission: RE | Disposition: A | Discharge status: Home / Self Care | Payer: Self-pay | Source: Home / Self Care | Attending: Orthopedic Surgery

## 2020-05-18 ENCOUNTER — Ambulatory Visit (HOSPITAL_COMMUNITY)
Admission: RE | Admit: 2020-05-18 | Discharge: 2020-05-18 | Disposition: A | Discharge status: Home / Self Care | Payer: Managed Care, Other (non HMO) | Attending: Orthopedic Surgery | Admitting: Orthopedic Surgery

## 2020-05-18 DIAGNOSIS — E119 Type 2 diabetes mellitus without complications: Secondary | ICD-10-CM | POA: Diagnosis not present

## 2020-05-18 DIAGNOSIS — F329 Major depressive disorder, single episode, unspecified: Secondary | ICD-10-CM | POA: Diagnosis not present

## 2020-05-18 DIAGNOSIS — I10 Essential (primary) hypertension: Secondary | ICD-10-CM | POA: Diagnosis not present

## 2020-05-18 DIAGNOSIS — M75121 Complete rotator cuff tear or rupture of right shoulder, not specified as traumatic: Secondary | ICD-10-CM

## 2020-05-18 DIAGNOSIS — Z79899 Other long term (current) drug therapy: Secondary | ICD-10-CM | POA: Diagnosis not present

## 2020-05-18 HISTORY — PX: BICEPT TENODESIS: SHX5116

## 2020-05-18 HISTORY — PX: ARTHOSCOPIC ROTAOR CUFF REPAIR: SHX5002

## 2020-05-18 LAB — GLUCOSE, CAPILLARY
Glucose-Capillary: 98 mg/dL (ref 70–99)
Glucose-Capillary: 113 mg/dL — ABNORMAL HIGH (ref 70–99)

## 2020-05-18 SURGERY — REPAIR, ROTATOR CUFF, ARTHROSCOPIC
Anesthesia: General | Site: Shoulder | Laterality: Right

## 2020-05-18 MED ORDER — MIDAZOLAM HCL 2 MG/2ML IJ SOLN
INTRAMUSCULAR | Status: AC
  Filled 2020-05-18: qty 2

## 2020-05-18 MED ORDER — SUCCINYLCHOLINE CHLORIDE 20 MG/ML IJ SOLN
INTRAMUSCULAR | Status: DC | PRN
  Administered 2020-05-18: 120 mg via INTRAVENOUS

## 2020-05-18 MED ORDER — ROPIVACAINE HCL 5 MG/ML IJ SOLN
INTRAMUSCULAR | Status: DC | PRN
  Administered 2020-05-18: 30 mL via EPIDURAL

## 2020-05-18 MED ORDER — EPINEPHRINE PF 1 MG/ML IJ SOLN
INTRAMUSCULAR | Status: AC
  Filled 2020-05-18: qty 6

## 2020-05-18 MED ORDER — HYDROMORPHONE HCL 1 MG/ML IJ SOLN: 0.2500 mg | INTRAMUSCULAR | Status: DC | PRN

## 2020-05-18 MED ORDER — ONDANSETRON HCL 4 MG/2ML IJ SOLN: 4.0000 mg | Freq: Once | INTRAMUSCULAR | Status: DC | PRN

## 2020-05-18 MED ORDER — MIDAZOLAM HCL 5 MG/5ML IJ SOLN
INTRAMUSCULAR | Status: DC | PRN
  Administered 2020-05-18: 2 mg via INTRAVENOUS

## 2020-05-18 MED ORDER — PROPOFOL 10 MG/ML IV BOLUS
INTRAVENOUS | Status: DC | PRN
  Administered 2020-05-18: 50 mg via INTRAVENOUS
  Administered 2020-05-18: 150 mg via INTRAVENOUS

## 2020-05-18 MED ORDER — CEFAZOLIN SODIUM-DEXTROSE 2-4 GM/100ML-% IV SOLN
2.0000 g | INTRAVENOUS | Status: AC
  Administered 2020-05-18: 2 g via INTRAVENOUS

## 2020-05-18 MED ORDER — DEXAMETHASONE SODIUM PHOSPHATE 10 MG/ML IJ SOLN
INTRAMUSCULAR | Status: AC
  Filled 2020-05-18: qty 1

## 2020-05-18 MED ORDER — CHLORHEXIDINE GLUCONATE 0.12 % MT SOLN
15.0000 mL | Freq: Once | OROMUCOSAL | Status: AC
  Administered 2020-05-18: 15 mL via OROMUCOSAL

## 2020-05-18 MED ORDER — CHLORHEXIDINE GLUCONATE 0.12 % MT SOLN
OROMUCOSAL | Status: AC
  Filled 2020-05-18: qty 45

## 2020-05-18 MED ORDER — CELECOXIB 100 MG PO CAPS: 100.0000 mg | ORAL_CAPSULE | Freq: Every day | ORAL | 0 refills | Status: AC

## 2020-05-18 MED ORDER — PROPOFOL 10 MG/ML IV BOLUS
INTRAVENOUS | Status: AC
  Filled 2020-05-18: qty 20

## 2020-05-18 MED ORDER — ROPIVACAINE HCL 5 MG/ML IJ SOLN
INTRAMUSCULAR | Status: AC
  Filled 2020-05-18: qty 30

## 2020-05-18 MED ORDER — PHENYLEPHRINE HCL (PRESSORS) 10 MG/ML IV SOLN
INTRAVENOUS | Status: DC | PRN
  Administered 2020-05-18 (×3): 40 ug via INTRAVENOUS

## 2020-05-18 MED ORDER — ORAL CARE MOUTH RINSE: 15.0000 mL | Freq: Once | OROMUCOSAL | Status: AC

## 2020-05-18 MED ORDER — EPINEPHRINE PF 1 MG/ML IJ SOLN
INTRAMUSCULAR | Status: AC
  Filled 2020-05-18: qty 4

## 2020-05-18 MED ORDER — SODIUM CHLORIDE 0.9 % IR SOLN
Status: DC | PRN
  Administered 2020-05-18: 18000 mL

## 2020-05-18 MED ORDER — BUPIVACAINE LIPOSOME 1.3 % IJ SUSP
INTRAMUSCULAR | Status: AC
  Filled 2020-05-18: qty 20

## 2020-05-18 MED ORDER — BUPIVACAINE-EPINEPHRINE (PF) 0.5% -1:200000 IJ SOLN
INTRAMUSCULAR | Status: AC
  Filled 2020-05-18: qty 60

## 2020-05-18 MED ORDER — ONDANSETRON HCL 4 MG PO TABS: 4.0000 mg | ORAL_TABLET | Freq: Three times a day (TID) | ORAL | 1 refills | Status: DC | PRN

## 2020-05-18 MED ORDER — SUGAMMADEX SODIUM 200 MG/2ML IV SOLN
INTRAVENOUS | Status: DC | PRN
  Administered 2020-05-18: 200 mg via INTRAVENOUS

## 2020-05-18 MED ORDER — DEXAMETHASONE SODIUM PHOSPHATE 4 MG/ML IJ SOLN
INTRAMUSCULAR | Status: DC | PRN
  Administered 2020-05-18: 8 mg via INTRAVENOUS

## 2020-05-18 MED ORDER — CEFAZOLIN SODIUM-DEXTROSE 2-4 GM/100ML-% IV SOLN
INTRAVENOUS | Status: AC
  Filled 2020-05-18: qty 100

## 2020-05-18 MED ORDER — FENTANYL CITRATE (PF) 100 MCG/2ML IJ SOLN
INTRAMUSCULAR | Status: AC
  Filled 2020-05-18: qty 2

## 2020-05-18 MED ORDER — PHENYLEPHRINE 40 MCG/ML (10ML) SYRINGE FOR IV PUSH (FOR BLOOD PRESSURE SUPPORT)
PREFILLED_SYRINGE | INTRAVENOUS | Status: AC
  Filled 2020-05-18: qty 10

## 2020-05-18 MED ORDER — SUCCINYLCHOLINE CHLORIDE 200 MG/10ML IV SOSY
PREFILLED_SYRINGE | INTRAVENOUS | Status: AC
  Filled 2020-05-18: qty 10

## 2020-05-18 MED ORDER — ROCURONIUM BROMIDE 100 MG/10ML IV SOLN
INTRAVENOUS | Status: DC | PRN
  Administered 2020-05-18: 50 mg via INTRAVENOUS
  Administered 2020-05-18 (×4): 10 mg via INTRAVENOUS

## 2020-05-18 MED ORDER — LACTATED RINGERS IV SOLN
INTRAVENOUS | Status: DC
  Administered 2020-05-18: 1000 mL via INTRAVENOUS

## 2020-05-18 MED ORDER — FENTANYL CITRATE (PF) 100 MCG/2ML IJ SOLN
INTRAMUSCULAR | Status: DC | PRN
  Administered 2020-05-18 (×2): 50 ug via INTRAVENOUS

## 2020-05-18 MED ORDER — SODIUM CHLORIDE 0.9 % IR SOLN
Status: DC | PRN
  Administered 2020-05-18: 1000 mL

## 2020-05-18 MED ORDER — ACETAMINOPHEN 500 MG PO TABS: 1000.0000 mg | ORAL_TABLET | Freq: Three times a day (TID) | ORAL | 0 refills | Status: AC

## 2020-05-18 MED ORDER — ONDANSETRON HCL 4 MG/2ML IJ SOLN
INTRAMUSCULAR | Status: DC | PRN
  Administered 2020-05-18: 4 mg via INTRAVENOUS

## 2020-05-18 MED ORDER — ONDANSETRON HCL 4 MG/2ML IJ SOLN
INTRAMUSCULAR | Status: AC
  Filled 2020-05-18: qty 2

## 2020-05-18 MED ORDER — ASPIRIN EC 81 MG PO TBEC: 81.0000 mg | DELAYED_RELEASE_TABLET | Freq: Two times a day (BID) | ORAL | 0 refills | Status: AC

## 2020-05-18 MED ORDER — OXYCODONE HCL 5 MG PO TABS
5.0000 mg | ORAL_TABLET | ORAL | 0 refills | Status: AC | PRN
Start: 2020-05-18 — End: 2020-05-25

## 2020-05-18 SURGICAL SUPPLY — 115 items
ABLATOR ASPIRATE 50D MULTI-PRT (SURGICAL WAND) ×1 IMPLANT
ANCH SUT FBRTK 1.3 2 TPE (Anchor) ×1 IMPLANT
ANCH SUT SWLK 19.1X4.75 (Anchor) ×1 IMPLANT
ANCHOR FBRTK 2.6 SUTURETAP 1.3 (Anchor) ×2 IMPLANT
ANCHOR SUT BIO SW 4.75X19.1 (Anchor) ×2 IMPLANT
APL PRP STRL LF DISP 70% ISPRP (MISCELLANEOUS) ×1
BANDAGE ESMARK 4X12 BL STRL LF (DISPOSABLE) ×1 IMPLANT
BIT DRILL 2.0X128 (BIT) IMPLANT
BIT DRILL 2.0X128MM (BIT)
BLADE AVERAGE 25MMX9MM (BLADE)
BLADE AVERAGE 25X9 (BLADE) IMPLANT
BLADE HEX COATED 2.75 (ELECTRODE) ×3 IMPLANT
BLADE SURG SZ10 CARB STEEL (BLADE) ×3 IMPLANT
BLADE SURG SZ11 CARB STEEL (BLADE) ×3 IMPLANT
BNDG CMPR 12X4 ELC STRL LF (DISPOSABLE) ×1
BNDG CMPR STD VLCR NS LF 5.8X4 (GAUZE/BANDAGES/DRESSINGS) ×1
BNDG COHESIVE 4X5 TAN STRL (GAUZE/BANDAGES/DRESSINGS) ×3 IMPLANT
BNDG ELASTIC 4X5.8 VLCR NS LF (GAUZE/BANDAGES/DRESSINGS) ×3 IMPLANT
BNDG ESMARK 4X12 BLUE STRL LF (DISPOSABLE) ×3
CANNULA DRILOCK 5.0MMX75MM (CANNULA) ×1
CANNULA DRILOCK 5.0X75 (CANNULA) ×2 IMPLANT
CANNULA DRILOCK 6.5MMX75MM (CANNULA) ×1
CANNULA DRILOCK 6.5X75 (CANNULA) ×1 IMPLANT
CHLORAPREP W/TINT 26 (MISCELLANEOUS) ×3 IMPLANT
CLOSURE WOUND 1/2 X4 (GAUZE/BANDAGES/DRESSINGS)
CLOTH BEACON ORANGE TIMEOUT ST (SAFETY) ×3 IMPLANT
COOLER ICEMAN CLASSIC (MISCELLANEOUS) ×2 IMPLANT
COVER LIGHT HANDLE STERIS (MISCELLANEOUS) ×6 IMPLANT
COVER WAND RF STERILE (DRAPES) ×3 IMPLANT
CUTTER BONE 4.0MM X 13CM (MISCELLANEOUS) ×2 IMPLANT
DRAPE HALF SHEET 40X57 (DRAPES) ×3 IMPLANT
DRAPE SHOULDER BEACH CHAIR (DRAPES) ×3 IMPLANT
DRAPE U-SHAPE 47X51 STRL (DRAPES) ×3 IMPLANT
DRESSING MEPILEX BORDER 6X8 (GAUZE/BANDAGES/DRESSINGS) ×2 IMPLANT
DRSG MEPILEX BORDER 6X8 (GAUZE/BANDAGES/DRESSINGS) ×3
ELECT REM PT RETURN 9FT ADLT (ELECTROSURGICAL) ×3
ELECTRODE REM PT RTRN 9FT ADLT (ELECTROSURGICAL) ×1 IMPLANT
FIBERSTICK 2 (SUTURE) IMPLANT
GAUZE 4X4 16PLY RFD (DISPOSABLE) ×3 IMPLANT
GAUZE SPONGE 4X4 12PLY STRL (GAUZE/BANDAGES/DRESSINGS) ×3 IMPLANT
GAUZE XEROFORM 5X9 LF (GAUZE/BANDAGES/DRESSINGS) ×3 IMPLANT
GLOVE BIOGEL PI IND STRL 7.0 (GLOVE) ×3 IMPLANT
GLOVE BIOGEL PI IND STRL 8 (GLOVE) ×1 IMPLANT
GLOVE BIOGEL PI INDICATOR 7.0 (GLOVE) ×6
GLOVE BIOGEL PI INDICATOR 8 (GLOVE) ×2
GLOVE SKINSENSE NS SZ8.0 LF (GLOVE) ×4
GLOVE SKINSENSE STRL SZ8.0 LF (GLOVE) ×2 IMPLANT
GOWN STRL REUS W/ TWL XL LVL3 (GOWN DISPOSABLE) ×1 IMPLANT
GOWN STRL REUS W/TWL LRG LVL3 (GOWN DISPOSABLE) ×8 IMPLANT
GOWN STRL REUS W/TWL XL LVL3 (GOWN DISPOSABLE) ×3
INST SET MINOR BONE (KITS) ×3 IMPLANT
IV NS IRRIG 3000ML ARTHROMATIC (IV SOLUTION) ×14 IMPLANT
KIT BLADEGUARD II DBL (SET/KITS/TRAYS/PACK) ×3 IMPLANT
KIT POSITION SHOULDER SCHLEI (MISCELLANEOUS) ×3 IMPLANT
KIT TURNOVER KIT A (KITS) ×3 IMPLANT
MANIFOLD NEPTUNE II (INSTRUMENTS) ×3 IMPLANT
NDL HYPO 21X1.5 SAFETY (NEEDLE) ×1 IMPLANT
NDL MAYO 6 CRC TAPER PT (NEEDLE) ×1 IMPLANT
NDL SCORPION (NEEDLE) IMPLANT
NDL SCORPION MULTI FIRE (NEEDLE) IMPLANT
NDL SPNL 18GX3.5 QUINCKE PK (NEEDLE) ×1 IMPLANT
NEEDLE HYPO 21X1.5 SAFETY (NEEDLE) ×3 IMPLANT
NEEDLE MAYO 6 CRC TAPER PT (NEEDLE) ×3 IMPLANT
NEEDLE SCORPION (NEEDLE) IMPLANT
NEEDLE SCORPION MULTI FIRE (NEEDLE) ×6 IMPLANT
NEEDLE SPNL 18GX3.5 QUINCKE PK (NEEDLE) ×3 IMPLANT
NS IRRIG 1000ML POUR BTL (IV SOLUTION) ×3 IMPLANT
PACK BASIC LIMB (CUSTOM PROCEDURE TRAY) ×3 IMPLANT
PACK TOTAL JOINT (CUSTOM PROCEDURE TRAY) ×3 IMPLANT
PAD ABD 5X9 TENDERSORB (GAUZE/BANDAGES/DRESSINGS) ×9 IMPLANT
PAD ARMBOARD 7.5X6 YLW CONV (MISCELLANEOUS) ×3 IMPLANT
PAD CAST 4YDX4 CTTN HI CHSV (CAST SUPPLIES) ×1 IMPLANT
PAD COLD SHLDR WRAP-ON (PAD) ×2 IMPLANT
PADDING CAST COTTON 4X4 STRL (CAST SUPPLIES) ×3
PASSER SUT CAPTURE FIRST (INSTRUMENTS) IMPLANT
PICK POWER XL 45DEG (MISCELLANEOUS) ×2 IMPLANT
PORT APPOLLO RF 90DEGREE MULTI (SURGICAL WAND) ×5 IMPLANT
RASP SM TEAR CROSS CUT (RASP) IMPLANT
SET ARTHROSCOPY INST (INSTRUMENTS) ×3 IMPLANT
SET BASIN LINEN APH (SET/KITS/TRAYS/PACK) ×3 IMPLANT
SLING ARM IMMOBILIZER LRG (SOFTGOODS) ×2 IMPLANT
SPONGE GAUZE 2X2 8PLY STER LF (GAUZE/BANDAGES/DRESSINGS) ×1
SPONGE GAUZE 2X2 8PLY STRL LF (GAUZE/BANDAGES/DRESSINGS) ×2 IMPLANT
SPONGE LAP 18X18 RF (DISPOSABLE) IMPLANT
STAPLER VISISTAT 35W (STAPLE) ×1 IMPLANT
STRIP CLOSURE SKIN 1/2X4 (GAUZE/BANDAGES/DRESSINGS) IMPLANT
SUT BONE WAX W31G (SUTURE) IMPLANT
SUT ETHIBOND NAB OS 4 #2 30IN (SUTURE) IMPLANT
SUT ETHILON 3 0 FSL (SUTURE) ×2 IMPLANT
SUT FIBERWIRE #2 38 REV NDL BL (SUTURE)
SUT FIBERWIRE #2 38 T-5 BLUE (SUTURE)
SUT LASSO 45 DEGREE (SUTURE) IMPLANT
SUT LASSO 45 DEGREE LEFT (SUTURE) IMPLANT
SUT LASSO 45D RIGHT (SUTURE) IMPLANT
SUT MON AB 0 CT1 (SUTURE) ×2 IMPLANT
SUT MON AB 2-0 CT1 36 (SUTURE) ×1 IMPLANT
SUT MON AB 2-0 SH 27 (SUTURE)
SUT MON AB 2-0 SH27 (SUTURE) ×2 IMPLANT
SUT PROLENE 2 0 FS (SUTURE) IMPLANT
SUT PROLENE 2 0 SH 30 (SUTURE) IMPLANT
SUT PROLENE 3 0 PS 1 (SUTURE) IMPLANT
SUT SILK 2 0 (SUTURE)
SUT SILK 2-0 18XBRD TIE 12 (SUTURE) ×1 IMPLANT
SUT VIC AB 1 CT1 27 (SUTURE)
SUT VIC AB 1 CT1 27XBRD ANTBC (SUTURE) IMPLANT
SUTURE FIBERWR #2 38 T-5 BLUE (SUTURE) IMPLANT
SUTURE FIBERWR#2 38 REV NDL BL (SUTURE) IMPLANT
SYR 10ML LL (SYRINGE) ×3 IMPLANT
SYR 30ML LL (SYRINGE) ×3 IMPLANT
SYR BULB IRRIG 60ML STRL (SYRINGE) ×3 IMPLANT
TAPE PAPER 3X10 WHT MICROPORE (GAUZE/BANDAGES/DRESSINGS) ×2 IMPLANT
TOWEL OR 17X26 4PK STRL BLUE (TOWEL DISPOSABLE) ×3 IMPLANT
TUBING IN/OUT FLOW W/MAIN PUMP (TUBING) ×3 IMPLANT
WAND 50 DEG COVAC W/CORD (SURGICAL WAND) ×2 IMPLANT
YANKAUER SUCT 12FT TUBE ARGYLE (SUCTIONS) ×3 IMPLANT

## 2020-05-18 NOTE — Brief Op Note (Signed)
05/18/2020  10:28 AM  PATIENT:  Hassan Buckler  54 y.o. female  PRE-OPERATIVE DIAGNOSIS:  Right shoulder rotator cuff tear  POST-OPERATIVE DIAGNOSIS:  Right shoulder rotator cuff tear  PROCEDURE:  Procedure(s): ARTHROSCOPIC ROTATOR CUFF REPAIR (Right) BICEPS TENOTOMY (Right)  SURGEON:  Surgeon(s) and Role:    Oliver Barre, MD - Primary  PHYSICIAN ASSISTANT:   ASSISTANTS: none   ANESTHESIA:   general and paracervical block  EBL:  Minimal   BLOOD ADMINISTERED:none  DRAINS: none   LOCAL MEDICATIONS USED:  NONE  SPECIMEN:  No Specimen  DISPOSITION OF SPECIMEN:  N/A  COUNTS:  YES  TOURNIQUET:  * No tourniquets in log *  DICTATION: .Note written in EPIC  PLAN OF CARE: Discharge to home after PACU  PATIENT DISPOSITION:  PACU - hemodynamically stable.   Delay start of Pharmacological VTE agent (>24hrs) due to surgical blood loss or risk of bleeding: no

## 2020-05-18 NOTE — Discharge Instructions (Signed)
Mark A. Dallas Schimke, MD MS [facility] [address] [place],  [place]  [address] Phone: [phone] Fax: [phone]    POST-OPERATIVE INSTRUCTIONS - SHOULDER ARTHROSCOPY  WOUND CARE . You may remove the Operative Dressing on Post-Op Day #3 (72hrs after surgery).   . Alternatively if you would like you can leave dressing on until follow-up if within 7-8 days but keep it dry. . Leave steri-strips in place until they fall off on their own, usually 2 weeks postop. . There may be a small amount of fluid/bleeding leaking at the surgical site. This is normal; the shoulder is filled with fluid during the procedure and can leak for 24-48hrs after surgery. Marland Kitchen ou may change/reinforce the bandage as needed.  . Use the Cryocuff or Ice as often as possible for the first 7 days, then as needed for pain relief. Always keep a towel, ACE wrap or other barrier between the cooling unit and your skin.  . You may shower on Post-Op Day #3. Gently pat the area dry. Do not soak the shoulder in water or submerge it. Keep dry incisions as dry as possible. . Do not go swimming in the pool or ocean until 4 weeks after surgery or when otherwise instructed.    EXERCISES/BRACING ? Sling should be used at all times until follow-up.  ? You can remove sling for hygiene.    . Please continue to ambulate and do not stay sitting or lying for too long. Perform foot and wrist pumps to assist in circulation.  POST-OP MEDICATIONS- Multimodal approach to pain control . In general your pain will be controlled with a combination of substances.  Prescriptions unless otherwise discussed are electronically sent to your pharmacy.  This is a carefully made plan we use to minimize narcotic use.    ? Meloxicam OR Celebrex - Anti-inflammatory medication taken on a scheduled basis ? Acetaminophen - Non-narcotic pain medicine taken on a scheduled basis  ? Oxycodone - This is a strong narcotic, to be used only on  an "as needed" basis for pain. ? Aspirin 81mg  - This medicine is used to minimize the risk of blood clots after surgery. ? Zofran - take as needed for nausea   FOLLOW-UP . If you develop a Fever (?101.5), Redness or Drainage from the surgical incision site, please call our office to arrange for an evaluation. . Please call the office to schedule a follow-up appointment for your suture removal, 10-14 days post-operatively.    HELPFUL INFORMATION  . If you had a block, it will wear off between 8-24 hrs postop typically.  This is period when your pain may go from nearly zero to the pain you would have had postop without the block.  This is an abrupt transition but nothing dangerous is happening.  You may take an extra dose of narcotic when this happens.  . You may be more comfortable sleeping in a semi-seated position the first few nights following surgery.  Keep a pillow propped under the elbow and forearm for comfort.  If you have a recliner type of chair it might be beneficial.  If not that is fine too, but it would be helpful to sleep propped up with pillows behind your operated shoulder as well under your elbow and forearm.  This will reduce pulling on the suture lines.  . When dressing, put your operative arm in the sleeve first.  When getting undressed, take your operative arm out last.  Loose fitting,  button-down shirts are recommended.  Often in the first days after surgery you may be more comfortable keeping your operative arm under your shirt and not through the sleeve.  . You may return to work/school in the next couple of days when you feel up to it.  Desk work and typing in the sling is     fine.  . We suggest you use the pain medication the first night prior to going to bed, in order to ease any pain when the anesthesia wears off. You should avoid taking pain medications on an empty stomach as it will make you nauseous.  . You should wean off your narcotic medicines as soon as you  are able.  Most patients will be off or using minimal narcotics before their first postop appointment.   . Do not drink alcoholic beverages or take illicit drugs when taking pain medications.  . It is against the law to drive while taking narcotics.  In some states it is against the law to drive while your arm is in a sling.   . Pain medication may make you constipated.  Below are a few solutions to try in this order: - Decrease the amount of pain medication if you aren't having pain. - Drink lots of decaffeinated fluids. - Drink prune juice and/or eat dried prunes  . If the first 3 don't work start with additional solutions - Take Colace - an over-the-counter stool softener - Take Senokot - an over-the-counter laxative - Take Miralax - a stronger over-the-counter laxative

## 2020-05-18 NOTE — Anesthesia Preprocedure Evaluation (Signed)
Anesthesia Evaluation  Patient identified by MRN, date of birth, ID band Patient awake    Reviewed: Allergy & Precautions, H&P , NPO status , Patient's Chart, lab work & pertinent test results, reviewed documented beta blocker date and time   Airway Mallampati: II  TM Distance: >3 FB Neck ROM: full    Dental no notable dental hx.    Pulmonary neg pulmonary ROS,    Pulmonary exam normal breath sounds clear to auscultation       Cardiovascular Exercise Tolerance: Good hypertension, negative cardio ROS   Rhythm:regular Rate:Normal     Neuro/Psych PSYCHIATRIC DISORDERS Depression negative neurological ROS     GI/Hepatic Neg liver ROS, GERD  Medicated,  Endo/Other  negative endocrine ROSdiabetes  Renal/GU negative Renal ROS  negative genitourinary   Musculoskeletal   Abdominal   Peds  Hematology  (+) Blood dyscrasia, anemia ,   Anesthesia Other Findings   Reproductive/Obstetrics negative OB ROS                             Anesthesia Physical Anesthesia Plan  ASA: II  Anesthesia Plan: General   Post-op Pain Management: GA combined w/ Regional for post-op pain   Induction:   PONV Risk Score and Plan: Ondansetron  Airway Management Planned:   Additional Equipment:   Intra-op Plan:   Post-operative Plan:   Informed Consent: I have reviewed the patients History and Physical, chart, labs and discussed the procedure including the risks, benefits and alternatives for the proposed anesthesia with the patient or authorized representative who has indicated his/her understanding and acceptance.     Dental Advisory Given  Plan Discussed with: CRNA  Anesthesia Plan Comments:         Anesthesia Quick Evaluation

## 2020-05-18 NOTE — Anesthesia Procedure Notes (Signed)
Anesthesia Regional Block: Interscalene brachial plexus block   Pre-Anesthetic Checklist: ,, timeout performed, Correct Patient, Correct Site, Correct Laterality, Correct Procedure, Correct Position, site marked, Risks and benefits discussed,  Surgical consent,  Pre-op evaluation,  At surgeon's request and post-op pain management  Laterality: Right  Prep: chloraprep       Needles:  Injection technique: Single-shot  Needle Type: Stimiplex     Needle Length: 5cm  Needle Gauge: 22     Additional Needles:   Procedures:, nerve stimulator,,, ultrasound used (permanent image in chart),,,,   Nerve Stimulator or Paresthesia:  Response: 0.01 mA,   Additional Responses:   Narrative:  Start time: 05/18/2020 7:12 AM End time: 05/18/2020 7:17 AM Injection made incrementally with aspirations every 3 mL.  Performed by: With CRNAs  Anesthesiologist: Windell Norfolk, MD CRNA: Brynda Peon, CRNA  Additional Notes: 81ml 0.5% Ropi with 8mg  decadron. Pt tolerated well.

## 2020-05-18 NOTE — Interval H&P Note (Signed)
History and Physical Interval Note:  05/18/2020 7:08 AM  Tammy Mayo  has presented today for surgery, with the diagnosis of Right shoulder rotator cuff tear.  The various methods of treatment have been discussed with the patient and family. After consideration of risks, benefits and other options for treatment, the patient has consented to  Procedure(s): ARTHROSCOPIC ROTATOR CUFF REPAIR (Right) and possible BICEPS TENODESIS (Right) as a surgical intervention.  The patient's history has been reviewed, patient examined, no change in status, stable for surgery.  I have reviewed the patient's chart and labs.  Questions were answered to the patient's satisfaction.     Oliver Barre

## 2020-05-18 NOTE — Anesthesia Postprocedure Evaluation (Signed)
Anesthesia Post Note  Patient: Tammy Mayo  Procedure(s) Performed: ARTHROSCOPIC ROTATOR CUFF REPAIR (Right Shoulder) BICEPS TENOTOMY (Right )  Patient location during evaluation: PACU Anesthesia Type: General Level of consciousness: awake, oriented, awake and alert and patient cooperative Pain management: pain level controlled Vital Signs Assessment: post-procedure vital signs reviewed and stable Respiratory status: spontaneous breathing, respiratory function stable and nonlabored ventilation Cardiovascular status: blood pressure returned to baseline and stable Postop Assessment: no headache and no backache Anesthetic complications: no   No complications documented.   Last Vitals:  Vitals:   05/18/20 0642  BP: 132/88  Pulse: 81  Resp: 16  Temp: 36.9 C  SpO2: 98%    Last Pain:  Vitals:   05/18/20 0642  TempSrc: Oral  PainSc: 5                  Brynda Peon

## 2020-05-18 NOTE — Anesthesia Procedure Notes (Signed)
Procedure Name: Intubation °Performed by: Zaeem Kandel L, CRNA °Pre-anesthesia Checklist: Patient identified, Emergency Drugs available, Suction available, Patient being monitored and Timeout performed °Patient Re-evaluated:Patient Re-evaluated prior to induction °Oxygen Delivery Method: Circle system utilized °Preoxygenation: Pre-oxygenation with 100% oxygen °Induction Type: IV induction °Laryngoscope Size: Miller and 2 °Grade View: Grade I °Tube size: 7.0 mm °Number of attempts: 1 °Airway Equipment and Method: Stylet °Placement Confirmation: ETT inserted through vocal cords under direct vision,  positive ETCO2,  CO2 detector and breath sounds checked- equal and bilateral °Secured at: 21 cm °Tube secured with: Tape °Dental Injury: Teeth and Oropharynx as per pre-operative assessment  ° ° ° ° ° ° °

## 2020-05-18 NOTE — Op Note (Signed)
Orthopaedic Surgery Operative Note (CSN: [id number])  Tammy Mayo  [birth year] Date of Surgery: 05/18/2020   Diagnoses:  Right shoulder rotator cuff tear  Procedure: Arthroscopic subacromial decompression Arthroscopic rotator cuff repair Biceps tenotomy   Operative Finding Exam under anesthesia: Passive forward flexion easily to 150 degrees, external rotation at her side beyond 45 degrees. Articular space: No loose bodies, capsule intact, labrum with some fraying Chondral surfaces:Intact, no sign of chondral degeneration on the humeral head; mild degenerative changes noted of the glenoid Biceps: Significant fraying of the intra-articular portion of the long head of the biceps tendon, and it was dislocated from the bicipital groove. Subscapularis: Intact Superior Cuff: Intact, with full-thickness tearing of the most anterior portion of the rotator cuff with some retraction.  The tendon was easily mobilized.  Successful completion of the planned procedure.  Arthroscopic rotator cuff repair with a single medial and lateral anchor.  Due to the overall appearance of the biceps tendon, as well as the fact that it was dislocated from the groove, a biceps tenotomy was completed arthroscopically.    Post-Op Diagnosis: Same Surgeons:Primary: Oliver Barre, MD Assistants: Cecile Sheerer Location: [facility] OR ROOM 4 Anesthesia: General with interscalene block Antibiotics: Ancef 2 g Tourniquet time: None Estimated Blood Loss: Minimal Complications: None Specimens: None Implants: Implant Name Type Inv. Item Serial No. Manufacturer Lot No. LRB No. Used Action  self- punching 2.6 fibertak suture anchor    [employer] [id number] Right 1 Implanted  ANCHOR SUT BIO SW 4.75X19.1 - [id number] Anchor ANCHOR SUT BIO SW 4.75X19.1  Marcie Bal [id number] Right 1 Implanted    Indications for Surgery:   Tammy Mayo is a 54 y.o. female with continued shoulder pain refractory to nonoperative measures  for extended period of time.  The risks and benefits were explained at length including but not limited to continued pain, cuff failure, stiffness, need for further surgery and infection as well as more severe complications associated with anesthesia.  Patient elected to proceed and surgical consent was obtained.   Procedure:   Patient was correctly identified in the preoperative holding area and operative site marked.  Patient brought to OR and positioned beachchair on an Schlein table ensuring that all bony prominences were padded and the head was in an appropriate position.  Anesthesia was induced and the operative shoulder was prepped and draped in the usual sterile fashion.  Timeout was called preincision.  Ancef 2 g was dosed prior to incision.  A standard posterior viewing portal was made after localizing the portal with a spinal needle.  An anterior accessory portal was also made.  The shoulder overall was very irritated.  The tissue is hyperemic.  We then identified the long head of the biceps, which demonstrated significant fraying.  We evaluated its course within the bicipital groove and it was noted to be subluxated from the groove.  All the patient's pain was anterior, we did discuss the possibility of proceeding with an arthroscopic biceps tenodesis, however given the degenerative condition of the tendon, I did not feel that a tenodesis would heal effectively.  As a result, I proceeded with a biceps tenotomy.  This was completed with electrocautery.  The subscapularis was then identified and noted to be completely intact.  We subsequently identified the full-thickness tear of the anterior aspect of the rotator cuff.  We proceeded to debride some of the degenerative soft tissue within the glenohumeral joint.  Overall, there is minimal cartilage damage to both the humeral head  as well as the glenoid.  Subacromial decompression: We made a lateral portal with spinal needle guidance. We then  proceeded to debride bursal tissue extensively with a shaver and arthrocare device. At that point we continued to identify the borders of the acromion and identify the spur. We then carefully preserved the deltoid fascia and used a bone cutter shaver to remove a small amount of spurring on the underside of the acromion without issue.  Arthroscopic Rotator Cuff Repair: We then identified the rotator cuff tear with some retraction within the anterior shoulder.  We wrapped this tissue with an arthroscopic grasper, and it was easily reduced back to its footprint.  The tuberosity was then prepared with a bone cutter to a bleeding bed.  We also used a power pick to introduce more bleeding to support tendon to bone healing.  Given the size of the tear, I felt that a single anchor medially, followed by a single anchor laterally would be sufficient.  We placed a single fiber tack at the articular margin.  Using a scorpion device we shuttled sutures medially in a horizontal mattress suture configuration.  We then tied using arthroscopic knot tying techniques  each suture to its partner reducing the tendon at the prepared insertion site.  The fiber tape was not tied. With a medial row suture limbs then incorporated, we placed a single 4.75 mm swivel lock approximate 10 mm distal to the medial row and secured this in place.  We tensioned the rotator cuff tendon under direct visualization in an attempt not to over tighten our repair.  We were satisfied with the reduction.  Sutures were then cut with an arthroscopic cutter.  We then evaluated our reduction, were pleased with the final repair.  The incisions were closed with 3-0 nylon.  A sterile dressing was placed along with a sling. The patient was awoken from general anesthesia and taken to the PACU in stable condition without complication.    Post-operative plan:  Weightbearing status: The patient will be non-weightbearing in a sling.  She is to keep the sling on at  all times until her first scheduled follow-up.  She can remove for hygiene. Disposition: The patient will be discharged home.  DVT prophylaxis: Aspirin 81 mg, twice daily for 2 weeks Pain control:  PRN pain medication preferring oral medicines.   Follow up plan: will be scheduled in approximately 7-10 days for incision check suture removal and XR  Please obtain x-rays of the right shoulder at the next visit.

## 2020-05-18 NOTE — Transfer of Care (Signed)
Immediate Anesthesia Transfer of Care Note  Patient: Tammy Mayo  Procedure(s) Performed: ARTHROSCOPIC ROTATOR CUFF REPAIR (Right Shoulder) BICEPS TENOTOMY (Right )  Patient Location: PACU  Anesthesia Type:General  Level of Consciousness: awake, alert , oriented and patient cooperative  Airway & Oxygen Therapy: Patient Spontanous Breathing and Patient connected to nasal cannula oxygen  Post-op Assessment: Report given to RN, Post -op Vital signs reviewed and stable and Patient moving all extremities  Post vital signs: Reviewed and stable  Last Vitals:  Vitals Value Taken Time  BP 139/78 05/18/20 1020  Temp    Pulse 81 05/18/20 1021  Resp 17 05/18/20 1021  SpO2 100 % 05/18/20 1021  Vitals shown include unvalidated device data.  Last Pain:  Vitals:   05/18/20 0642  TempSrc: Oral  PainSc: 5       Patients Stated Pain Goal: 7 (05/18/20 7517)  Complications: No complications documented.

## 2020-05-19 ENCOUNTER — Encounter (HOSPITAL_COMMUNITY): Payer: Self-pay | Admitting: Orthopedic Surgery

## 2020-05-26 ENCOUNTER — Ambulatory Visit: Payer: Managed Care, Other (non HMO)

## 2020-05-26 ENCOUNTER — Other Ambulatory Visit: Payer: Self-pay

## 2020-05-26 ENCOUNTER — Encounter: Payer: Self-pay | Admitting: Orthopedic Surgery

## 2020-05-26 ENCOUNTER — Ambulatory Visit (INDEPENDENT_AMBULATORY_CARE_PROVIDER_SITE_OTHER): Payer: Managed Care, Other (non HMO) | Admitting: Orthopedic Surgery

## 2020-05-26 DIAGNOSIS — Z9889 Other specified postprocedural states: Secondary | ICD-10-CM | POA: Insufficient documentation

## 2020-05-26 DIAGNOSIS — M75121 Complete rotator cuff tear or rupture of right shoulder, not specified as traumatic: Secondary | ICD-10-CM | POA: Diagnosis not present

## 2020-05-26 NOTE — Progress Notes (Signed)
Orthopaedic Postop Note  Assessment: Tammy Mayo is a 54 y.o. female s/p right shoulder arthroscopy, rotator cuff repair and biceps tenotomy  DOS: 05/18/2020  Plan: 1.  Sutures removed, steri strips placed 2.  Sling at all time when sleeping, or up and moving.  Ok to remove sling when seated and not moving 3.  3-4 times per day, remove the sling and complete the pendulum exercises demonstrated in clinic today 4.  We have sent a referral for physical therapy, please take the protocol with you  5.  F/u 4 weeks   Follow-up: Return in about 4 weeks (around 06/23/2020). XR at next visit: None  Subjective:  Chief Complaint  Patient presents with  . Routine Post Op    right shoulder arthroscopy 05/18/20     History of Present Illness: Tammy Mayo is a 54 y.o. female who presents following the above stated procedure.  She is doing well overall.  She continues to take pain medications on a scheduled basis, but reports the medication is helping.  She denies any numbness or tingling.  She has tolerated the sling well.  She notes some tightness in the shoulder when she tries to move it.  No fevers or chills.  Review of Systems: No fevers or chills No numbness or tingling No Chest Pain No shortness of breath   Objective: Physical Exam:  No swelling about the shoulder Incisions are healing well, no surrounding erythema or drainage  Tolerates gentle ROM Full extension of the elbow without discomfort No tenderness to palpation within the muscle of the biceps.  IMAGING: I personally ordered and reviewed the following images:   X-ray of the right shoulder demonstrates good overall alignment.  There is no evidence of a foreign body within the joint.  No adverse features following surgery.  Glenohumeral space is maintained.    Impression: Normal right shoulder x-ray following right shoulder arthroscopy  Oliver Barre, MD 05/26/2020 9:49 AM

## 2020-05-26 NOTE — Patient Instructions (Signed)
1.  Sutures removed, steri strips placed 2.  Sling at all time when sleeping, or up and moving.  Ok to remove sling when seated and not moving 3.  3-4 times per day, remove the sling and complete the pendulum exercises demonstrated in clinic today 4.  We have sent a referral for physical therapy, please take the protocol with you  5.  F/u 4 weeks

## 2020-05-29 ENCOUNTER — Telehealth: Payer: Self-pay | Admitting: Orthopedic Surgery

## 2020-05-29 NOTE — Telephone Encounter (Signed)
Patient called and wants to know she had surgery 2 weeks ago and she is scheduled to get a abilify injection in her right arm and wants to know if it is ok that she does that.   Please call her at [phone]

## 2020-05-30 ENCOUNTER — Encounter: Payer: Self-pay | Admitting: Physical Therapy

## 2020-05-30 ENCOUNTER — Other Ambulatory Visit: Payer: Self-pay

## 2020-05-30 ENCOUNTER — Ambulatory Visit: Payer: Managed Care, Other (non HMO) | Attending: Orthopedic Surgery | Admitting: Physical Therapy

## 2020-05-30 DIAGNOSIS — M25611 Stiffness of right shoulder, not elsewhere classified: Secondary | ICD-10-CM | POA: Diagnosis present

## 2020-05-30 DIAGNOSIS — M6281 Muscle weakness (generalized): Secondary | ICD-10-CM | POA: Diagnosis present

## 2020-05-30 DIAGNOSIS — M25511 Pain in right shoulder: Secondary | ICD-10-CM | POA: Insufficient documentation

## 2020-05-30 DIAGNOSIS — G8929 Other chronic pain: Secondary | ICD-10-CM | POA: Insufficient documentation

## 2020-05-30 NOTE — Telephone Encounter (Signed)
Patient called back to relay she missed Dr Dallas Schimke' call, due to being in physical therapy at that time. We reviewed Dr Dallas Schimke' response as noted; patient voiced understanding.

## 2020-05-30 NOTE — Therapy (Signed)
[facility] [address] [place], [place], [address] Phone: [phone]   Fax:  [phone]  Physical Therapy Evaluation  Patient Details  Name: Tammy Mayo MRN: [id number] Date of Birth: [birth year] Referring Provider (PT): Thane Edu, MD   Encounter Date: 05/30/2020   PT End of Session - 05/30/20 1257    Visit Number 1    Number of Visits 8    Date for PT Re-Evaluation 07/25/20    Authorization Type [employer] (No Vaso) FOTO (to be completed next visit); Progress note every 10th visit    PT Start Time 0945    PT Stop Time 1028    PT Time Calculation (min) 43 min    Activity Tolerance Patient tolerated treatment well    Behavior During Therapy WFL for tasks assessed/performed           Past Medical History:  Diagnosis Date  . Anemia   . Arthritis   . Chronic back pain    lumbar 4-5 slip and radicular right leg pain  . COVID-19 07/27/2019  . Depression   . Diabetes mellitus without complication (HCC)   . GERD (gastroesophageal reflux disease)   . Hyperlipidemia   . Hypertension   . Seasonal allergies   . Vitamin D deficiency   . Wears glasses     Past Surgical History:  Procedure Laterality Date  . ARTHOSCOPIC ROTAOR CUFF REPAIR Right 05/18/2020   Procedure: ARTHROSCOPIC ROTATOR CUFF REPAIR;  Surgeon: Oliver Barre, MD;  Location: [facility];  Service: Orthopedics;  Laterality: Right;  . BACK SURGERY  05/2015   lumbar fusion  . BICEPT TENODESIS Right 05/18/2020   Procedure: BICEPS TENOTOMY;  Surgeon: Oliver Barre, MD;  Location: [facility];  Service: Orthopedics;  Laterality: Right;  . CESAREAN SECTION     x 1  . CHOLECYSTECTOMY N/A 11/13/2015   Procedure: LAPAROSCOPIC CHOLECYSTECTOMY;  Surgeon: Emelia Loron, MD;  Location: [facility] OR;  Service: General;  Laterality: N/A;  . DILATION AND CURETTAGE OF UTERUS    . HERNIA REPAIR     umbilical hernia repair as a child  . TUBAL LIGATION      There were no vitals filed  for this visit.    Subjective Assessment - 05/30/20 1247    Subjective Patient arrives to physical therapy with reports of right shoulder pain and difficulties performing ADLs and home activities secondary to a R RTC and biceps tenotomy on 05/18/2020. Patient reports gaining assistance for home activities from husband. Patient able to don/doff sling independently and is able to perform basic ADLs but with pain and increased time. Patient's pain at worst is rated at 7.5/10 and pain at best as 4.5/10. Patient's goals are to decrease pain, improve movement, improve strength, and improve ability to perform ADLs and home activities.    Pertinent History R RTC repair and biceps tenodesis 05/18/2020, HTN, DM, history of lumbar fusion 06/21/2015    Limitations Lifting;House hold activities    Diagnostic tests X-Ray, MRI; see post op notes for surgical intervention    Patient Stated Goals use arm normally again.    Currently in Pain? Yes    Pain Score 7    7.5   Pain Location Shoulder    Pain Orientation Right    Pain Descriptors / Indicators Aching    Pain Type Surgical pain    Pain Onset 1 to 4 weeks ago    Pain Frequency Constant    Aggravating Factors  dressing  Pain Relieving Factors ice, pain medication    Effect of Pain on Daily Activities pain with ADLs. requires husband for home activities and meal prep.              [facility] PT Assessment - 05/30/20 0001      Assessment   Medical Diagnosis S/P arthroscopy of right shoulder, complete tear of right rotator cuff, unspecified whether traumatic    Referring Provider (PT) Thane Edu, MD    Onset Date/Surgical Date 05/18/20    Hand Dominance Right    Next MD Visit 06/27/2020    Prior Therapy yes      Precautions   Precautions Shoulder    Type of Shoulder Precautions Please follow Dr. Dallas Schimke' protocol      Restrictions   Weight Bearing Restrictions Yes    RUE Weight Bearing Non weight bearing      Balance Screen   Has the  patient fallen in the past 6 months No    Has the patient had a decrease in activity level because of a fear of falling?  No    Is the patient reluctant to leave their home because of a fear of falling?  No      Home [employer] information [employer] residence    Living Arrangements Spouse/significant other      Prior Function   Level of Independence Needs assistance with homemaking      Observation/Other Assessments   Observations sling donned and in good position    Skin Integrity 3 port incisions dressed with steristrips    Focus on Therapeutic Outcomes (FOTO)  Please do FOTO next visit      Observation/Other Assessments-Edema    Edema --   moderate edema of R shoulder noted upon observation     ROM / Strength   AROM / PROM / Strength PROM      PROM   PROM Assessment Site Shoulder    Right/Left Shoulder Right    Right Shoulder Flexion 38 Degrees    Right Shoulder Internal Rotation 57 Degrees   hand to andomen   Right Shoulder External Rotation 16 Degrees      Palpation   Palpation comment minmal tendernes to palpation when assessing R shoulder      Transfers   Comments slow transitions with supervision      Ambulation/Gait   Gait Pattern Within Functional Limits                      Objective measurements completed on examination: See above findings.       [facility] Adult PT Treatment/Exercise - 05/30/20 0001      Modalities   Modalities Cryotherapy;Electrical Stimulation      Cryotherapy   Number Minutes Cryotherapy 10 Minutes    Cryotherapy Location Shoulder    Type of Cryotherapy Ice pack      Electrical Stimulation   Electrical Stimulation Location right shoulder    Electrical Stimulation Action IFC    Electrical Stimulation Parameters 80-150 hz x10 mins    Electrical Stimulation Goals Pain                  PT Education - 05/30/20 1255    Education Details continue with pendulums and right wrist AROM provided by MD; putty  squeeze with yellow putty    Person(s) Educated Patient    Methods Explanation;Demonstration    Comprehension Verbalized understanding  PT Long Term Goals - 05/30/20 1325      PT LONG TERM GOAL #1   Title Patient will be independent with HEP and its progression    Time 8    Period Weeks    Status New      PT LONG TERM GOAL #2   Title Patient will report ability to perform ADLs with right shoulder pain less than or equal to 3/10.    Baseline --    Time 8    Period Weeks    Status New      PT LONG TERM GOAL #3   Title Patient will demonstrate 60+ degrees of right shoulder ER AROM to improve donning/doffing apparel.    Baseline --    Time 8    Period Weeks    Status New      PT LONG TERM GOAL #4   Title Patient will demonstrate 140+ degrees of right shoulder flexion AROM to improve overhead activities.    Baseline --    Time 8    Period Weeks    Status New      PT LONG TERM GOAL #5   Title Patient will demonstrate 3+/5 or greater right shoulder MMT in all planes to improve stability during functional tasks.    Baseline --    Time 8    Period Weeks    Status New                  Plan - 05/30/20 1259    Clinical Impression Statement Patient is a 54 year old right handed female who presents to physical therapy with right shoulder pain, decreased right shoulder PROM, and mild-moderate edema of right shoulder secondary to a R arthroscopic supraspinatus RTC repair and biceps tenotomy on 05/18/2020. Patient able to don/doff sling independently but with increased time. Patient slow with bed mobility with supervision. Patient and PT discussed protocol, plan of care, and HEP provided by surgeon. Patient has 8 visits remaining per calendar year therefore patient to perform PT 2x/week for 2 weeks then 1x per week for remaining visits with emaphasis on HEP. Patient reported understanding. Patient would benefit from skilled physical therapy to address deficits  and address patient's goals.    Personal Factors and Comorbidities Age;Comorbidity 2    Comorbidities R RTC repair and biceps tenotomy 05/18/2020, HTN, DM, history of lumbar fusion 06/21/2015    Examination-Activity Limitations Sleep;Dressing;Hygiene/Grooming;Lift    Examination-Participation Restrictions Cleaning;Meal Prep;Laundry    Stability/Clinical Decision Making Stable/Uncomplicated    Clinical Decision Making Low    Rehab Potential Fair    PT Frequency Other (comment)   2x/week for 2 weeks; 1x/week for remaining visits remaining per calendar year   PT Duration Other (comment)    PT Treatment/Interventions ADLs/Self Care Home Management;Cryotherapy;Electrical Stimulation;Moist Heat;Iontophoresis 4mg /ml Dexamethasone;Neuromuscular re-education;Therapeutic exercise;Therapeutic activities;Functional mobility training;Patient/family education;Manual techniques;Passive range of motion;Scar mobilization    PT Next Visit Plan FOTO NEXT VISIT (ALREADY SET UP) Per protocol; PROM to right shoulder to tolerance, e-stim, cold pack PRN for pain relief; NO VASO    PT Home Exercise Plan cont. pendulums provided by MD; theraputty    Consulted and Agree with Plan of Care Patient           Patient will benefit from skilled therapeutic intervention in order to improve the following deficits and impairments:  Decreased activity tolerance, Decreased strength, Increased edema, Postural dysfunction, Pain, Impaired UE functional use, Decreased range of motion  Visit Diagnosis: Acute pain of right  shoulder - Plan: PT plan of care cert/re-cert  Stiffness of right shoulder, not elsewhere classified - Plan: PT plan of care cert/re-cert  Muscle weakness (generalized) - Plan: PT plan of care cert/re-cert     Problem List Patient Active Problem List   Diagnosis Date Noted  . S/P arthroscopy of right shoulder 05/26/2020  . Chronic right shoulder pain 11/22/2019  . Constipation 11/22/2019  . Type 2  diabetes mellitus with diabetic neuropathy, with long-term current use of insulin (HCC) 11/22/2019  . Back pain 06/21/2015  . HTN (hypertension) 10/23/2010  . Vitamin D deficiency 10/23/2010  . Hyperlipemia 10/23/2010  . Plantar fasciitis 10/23/2010    Guss BundeKrystle Mc Hollen, PT, DPT 05/30/2020, 1:34 PM  [facility] [address] [place], [place], [address] Phone: [phone]   Fax:  [phone]  Name: Tammy Mayo MRN: [id number] Date of Birth: 20-Jul-1966

## 2020-05-30 NOTE — Telephone Encounter (Signed)
Contacted Mrs. Timbrook at number listed.  No answer, left a voicemail.  Please ask her to wait a couple of weeks if possible.  It will not affect her surgery, but might cause some local irritation.  She can also consider receiving the injection in her left shoulder if that is an option.  If this is a possibility, she can get her injection at any time.  If she has any further issues, she could schedule an earlier return visit if necessary.   Zillah Alexie A. Dallas Schimke, MD MS [facility] [address] [place],  [place]  [address] Phone: [phone] Fax: [phone]

## 2020-05-31 ENCOUNTER — Telehealth: Payer: Self-pay | Admitting: Orthopedic Surgery

## 2020-05-31 ENCOUNTER — Other Ambulatory Visit: Payer: Self-pay | Admitting: Family Medicine

## 2020-05-31 MED ORDER — OXYCODONE HCL 5 MG PO TABS
5.0000 mg | ORAL_TABLET | Freq: Four times a day (QID) | ORAL | 0 refills | Status: AC | PRN
Start: 2020-05-31 — End: 2020-06-07

## 2020-05-31 NOTE — Telephone Encounter (Signed)
Sent update prescription to her preferred pharmacy.  Adjusted the dosing schedule and provided fewer tablets.  Will continue to encourage her to decrease her use of narcotics postop.    Glynda Soliday A. Dallas Schimke, MD MS [facility] [address] [place],  [place]  [address] Phone: [phone] Fax: [phone]

## 2020-05-31 NOTE — Telephone Encounter (Signed)
Patient called and requested refill on Oxycodone 5 mgs.  Qty  42   Sig: Take 1 tablet (5 mg total) by mouth every 4 (four) hours as needed for up to 7 days.  Patient states she uses [facility] in [place]

## 2020-06-01 ENCOUNTER — Other Ambulatory Visit: Payer: Self-pay

## 2020-06-01 ENCOUNTER — Encounter: Payer: Self-pay | Admitting: Physical Therapy

## 2020-06-01 ENCOUNTER — Ambulatory Visit: Payer: Managed Care, Other (non HMO) | Admitting: Physical Therapy

## 2020-06-01 DIAGNOSIS — M6281 Muscle weakness (generalized): Secondary | ICD-10-CM

## 2020-06-01 DIAGNOSIS — M25611 Stiffness of right shoulder, not elsewhere classified: Secondary | ICD-10-CM

## 2020-06-01 DIAGNOSIS — M25511 Pain in right shoulder: Secondary | ICD-10-CM

## 2020-06-01 NOTE — Telephone Encounter (Signed)
Patient voices understanding and does not have any questions.

## 2020-06-01 NOTE — Therapy (Signed)
[facility] [address] [place], [place], [address] Phone: [phone]   Fax:  [phone]  Physical Therapy Treatment  Patient Details  Name: Tammy Mayo MRN: [id number] Date of Birth: [birth year] Referring Provider (PT): Thane Edu, MD   Encounter Date: 06/01/2020   PT End of Session - 06/01/20 0951    Visit Number 2    Number of Visits 8    Date for PT Re-Evaluation 07/25/20    Authorization Type [employer] (No Vaso) FOTO (to be completed next visit); Progress note every 10th visit    PT Start Time [phone]    PT Stop Time 1028    PT Time Calculation (min) 37 min    Equipment Utilized During Treatment Other (comment)   sling   Activity Tolerance Patient tolerated treatment well    Behavior During Therapy WFL for tasks assessed/performed           Past Medical History:  Diagnosis Date  . Anemia   . Arthritis   . Chronic back pain    lumbar 4-5 slip and radicular right leg pain  . COVID-19 07/27/2019  . Depression   . Diabetes mellitus without complication (HCC)   . GERD (gastroesophageal reflux disease)   . Hyperlipidemia   . Hypertension   . Seasonal allergies   . Vitamin D deficiency   . Wears glasses     Past Surgical History:  Procedure Laterality Date  . ARTHOSCOPIC ROTAOR CUFF REPAIR Right 05/18/2020   Procedure: ARTHROSCOPIC ROTATOR CUFF REPAIR;  Surgeon: Oliver Barre, MD;  Location: [facility];  Service: Orthopedics;  Laterality: Right;  . BACK SURGERY  05/2015   lumbar fusion  . BICEPT TENODESIS Right 05/18/2020   Procedure: BICEPS TENOTOMY;  Surgeon: Oliver Barre, MD;  Location: [facility];  Service: Orthopedics;  Laterality: Right;  . CESAREAN SECTION     x 1  . CHOLECYSTECTOMY N/A 11/13/2015   Procedure: LAPAROSCOPIC CHOLECYSTECTOMY;  Surgeon: Emelia Loron, MD;  Location: [facility] OR;  Service: General;  Laterality: N/A;  . DILATION AND CURETTAGE OF UTERUS    . HERNIA REPAIR     umbilical hernia repair as  a child  . TUBAL LIGATION      There were no vitals filed for this visit.   Subjective Assessment - 06/01/20 0951    Subjective COVID 19 screening performed on patient upon arrival. Reports 5/10 pain upon arrival.    Pertinent History R RTC repair and biceps tenodesis 05/18/2020, HTN, DM, history of lumbar fusion 06/21/2015    Limitations Lifting;House hold activities    Diagnostic tests X-Ray, MRI; see post op notes for surgical intervention    Patient Stated Goals use arm normally again.    Currently in Pain? Yes    Pain Score 5     Pain Location Shoulder    Pain Orientation Right    Pain Descriptors / Indicators Aching    Pain Type Surgical pain    Pain Onset 1 to 4 weeks ago    Pain Frequency Constant              [facility] PT Assessment - 06/01/20 0001      Assessment   Medical Diagnosis S/P arthroscopy of right shoulder, complete tear of right rotator cuff, unspecified whether traumatic    Referring Provider (PT) Thane Edu, MD    Onset Date/Surgical Date 05/18/20    Hand Dominance Right    Next MD Visit 06/27/2020    Prior Therapy yes  Precautions   Precautions Shoulder    Type of Shoulder Precautions Please follow Dr. Dallas Schimke' protocol      Restrictions   Weight Bearing Restrictions Yes    RUE Weight Bearing Non weight bearing      Observation/Other Assessments   Focus on Therapeutic Outcomes (FOTO)  68% limitation, CL                         [facility] Adult PT Treatment/Exercise - 06/01/20 0001      Modalities   Modalities Cryotherapy;Electrical Stimulation      Cryotherapy   Number Minutes Cryotherapy 10 Minutes    Cryotherapy Location Shoulder    Type of Cryotherapy Ice pack      Electrical Stimulation   Electrical Stimulation Location right shoulder    Electrical Stimulation Action Pre-mod    Electrical Stimulation Parameters 80-150 hz x10 min    Electrical Stimulation Goals Pain;Edema      Manual Therapy   Manual Therapy Passive  ROM    Passive ROM PROM of R shoulder into flexion, ER, IR with gentle holds and intermittant oscillations to reduce guarding                       PT Long Term Goals - 05/30/20 1325      PT LONG TERM GOAL #1   Title Patient will be independent with HEP and its progression    Time 8    Period Weeks    Status New      PT LONG TERM GOAL #2   Title Patient will report ability to perform ADLs with right shoulder pain less than or equal to 3/10.    Baseline --    Time 8    Period Weeks    Status New      PT LONG TERM GOAL #3   Title Patient will demonstrate 60+ degrees of right shoulder ER AROM to improve donning/doffing apparel.    Baseline --    Time 8    Period Weeks    Status New      PT LONG TERM GOAL #4   Title Patient will demonstrate 140+ degrees of right shoulder flexion AROM to improve overhead activities.    Baseline --    Time 8    Period Weeks    Status New      PT LONG TERM GOAL #5   Title Patient will demonstrate 3+/5 or greater right shoulder MMT in all planes to improve stability during functional tasks.    Baseline --    Time 8    Period Weeks    Status New                 Plan - 06/01/20 1129    Clinical Impression Statement Patient presented in clinic with reports of moderate R shoulder pain upon arrival. Patient was cautioned to avoid any cleaning or ADLs without sling donned at this time. Firm end feels and smooth arc of motion noted during PROM of R shoulder into all directions. Intermittant oscillations provided during PROM of R shoulder to reduce guarding and pain. Normal modalities response noted following removal of the modality.    Personal Factors and Comorbidities Age;Comorbidity 2    Comorbidities R RTC repair and biceps tenotomy 05/18/2020, HTN, DM, history of lumbar fusion 06/21/2015    Examination-Activity Limitations Sleep;Dressing;Hygiene/Grooming;Lift    Examination-Participation Restrictions Cleaning;Meal  Prep;Laundry    Stability/Clinical Decision Making  Stable/Uncomplicated    Rehab Potential Fair    PT Frequency Other (comment)    PT Duration Other (comment)    PT Treatment/Interventions ADLs/Self Care Home Management;Cryotherapy;Electrical Stimulation;Moist Heat;Iontophoresis 4mg /ml Dexamethasone;Neuromuscular re-education;Therapeutic exercise;Therapeutic activities;Functional mobility training;Patient/family education;Manual techniques;Passive range of motion;Scar mobilization    PT Next Visit Plan Per protocol; PROM to right shoulder to tolerance, e-stim, cold pack PRN for pain relief; NO VASO    PT Home Exercise Plan cont. pendulums provided by MD; theraputty    Consulted and Agree with Plan of Care Patient           Patient will benefit from skilled therapeutic intervention in order to improve the following deficits and impairments:  Decreased activity tolerance, Decreased strength, Increased edema, Postural dysfunction, Pain, Impaired UE functional use, Decreased range of motion  Visit Diagnosis: Acute pain of right shoulder  Stiffness of right shoulder, not elsewhere classified  Muscle weakness (generalized)     Problem List Patient Active Problem List   Diagnosis Date Noted  . S/P arthroscopy of right shoulder 05/26/2020  . Chronic right shoulder pain 11/22/2019  . Constipation 11/22/2019  . Type 2 diabetes mellitus with diabetic neuropathy, with long-term current use of insulin (HCC) 11/22/2019  . Back pain 06/21/2015  . HTN (hypertension) 10/23/2010  . Vitamin D deficiency 10/23/2010  . Hyperlipemia 10/23/2010  . Plantar fasciitis 10/23/2010    10/25/2010, PTA 06/01/2020, 11:42 AM  [facility] [address] [place], [place], [place] Phone: [phone]   Fax:  [phone]  Name: Tammy Mayo MRN: Hassan Buckler Date of Birth: 1966-04-07

## 2020-06-06 ENCOUNTER — Other Ambulatory Visit: Payer: Self-pay

## 2020-06-06 ENCOUNTER — Ambulatory Visit: Payer: Managed Care, Other (non HMO) | Admitting: Physical Therapy

## 2020-06-06 DIAGNOSIS — M25511 Pain in right shoulder: Secondary | ICD-10-CM | POA: Diagnosis not present

## 2020-06-06 DIAGNOSIS — M6281 Muscle weakness (generalized): Secondary | ICD-10-CM

## 2020-06-06 DIAGNOSIS — M25611 Stiffness of right shoulder, not elsewhere classified: Secondary | ICD-10-CM

## 2020-06-06 NOTE — Therapy (Signed)
[facility] [address] [place], [place], [address] Phone: [phone]   Fax:  [phone]  Physical Therapy Treatment  Patient Details  Name: Tammy Mayo MRN: [id number] Date of Birth: 03-20-1966 Referring Provider (PT): Thane Edu, MD   Encounter Date: 06/06/2020   PT End of Session - 06/06/20 0949    Visit Number 3    Number of Visits 8    Date for PT Re-Evaluation 07/25/20    Authorization Type [employer] (No Vaso) FOTO (68%); Progress note every 10th visit    PT Start Time 0945    PT Stop Time 1033    PT Time Calculation (min) 48 min    Activity Tolerance Patient tolerated treatment well    Behavior During Therapy WFL for tasks assessed/performed           Past Medical History:  Diagnosis Date  . Anemia   . Arthritis   . Chronic back pain    lumbar 4-5 slip and radicular right leg pain  . COVID-19 07/27/2019  . Depression   . Diabetes mellitus without complication (HCC)   . GERD (gastroesophageal reflux disease)   . Hyperlipidemia   . Hypertension   . Seasonal allergies   . Vitamin D deficiency   . Wears glasses     Past Surgical History:  Procedure Laterality Date  . ARTHOSCOPIC ROTAOR CUFF REPAIR Right 05/18/2020   Procedure: ARTHROSCOPIC ROTATOR CUFF REPAIR;  Surgeon: Oliver Barre, MD;  Location: [facility];  Service: Orthopedics;  Laterality: Right;  . BACK SURGERY  05/2015   lumbar fusion  . BICEPT TENODESIS Right 05/18/2020   Procedure: BICEPS TENOTOMY;  Surgeon: Oliver Barre, MD;  Location: [facility];  Service: Orthopedics;  Laterality: Right;  . CESAREAN SECTION     x 1  . CHOLECYSTECTOMY N/A 11/13/2015   Procedure: LAPAROSCOPIC CHOLECYSTECTOMY;  Surgeon: Emelia Loron, MD;  Location: [facility] OR;  Service: General;  Laterality: N/A;  . DILATION AND CURETTAGE OF UTERUS    . HERNIA REPAIR     umbilical hernia repair as a child  . TUBAL LIGATION      There were no vitals filed for this visit.    Subjective Assessment - 06/06/20 1027    Subjective COVID 19 screening performed on patient upon arrival. Patient reports 5.5/10 pain in left shoulder. States improvements with ADLs with less pain.    Pertinent History R RTC repair and biceps tenodesis 05/18/2020, HTN, DM, history of lumbar fusion 06/21/2015    Limitations Lifting;House hold activities    Diagnostic tests X-Ray, MRI; see post op notes for surgical intervention    Patient Stated Goals use arm normally again.    Currently in Pain? Yes    Pain Score 5     Pain Location Shoulder    Pain Orientation Right    Pain Descriptors / Indicators Aching    Pain Type Surgical pain    Pain Onset 1 to 4 weeks ago    Pain Frequency Constant              [facility] PT Assessment - 06/06/20 0001      Assessment   Medical Diagnosis S/P arthroscopy of right shoulder, complete tear of right rotator cuff, unspecified whether traumatic    Referring Provider (PT) Thane Edu, MD    Onset Date/Surgical Date 05/18/20    Hand Dominance Right    Next MD Visit 06/27/2020    Prior Therapy yes      Precautions  Precautions Shoulder    Type of Shoulder Precautions Please follow Dr. Dallas Schimke' protocol      Restrictions   Weight Bearing Restrictions Yes    RUE Weight Bearing Non weight bearing                         [facility] Adult PT Treatment/Exercise - 06/06/20 0001      Modalities   Modalities Cryotherapy;Electrical Stimulation      Cryotherapy   Number Minutes Cryotherapy 10 Minutes    Cryotherapy Location Shoulder    Type of Cryotherapy Ice pack      Electrical Stimulation   Electrical Stimulation Location right shoulder     Electrical Stimulation Action IFC    Electrical Stimulation Parameters 80-150 hz x10 mins    Electrical Stimulation Goals Pain;Edema      Manual Therapy   Manual Therapy Passive ROM    Passive ROM PROM of R shoulder into flexion, ER, IR with gentle holds and intermittent oscillations to reduce  guarding                       PT Long Term Goals - 05/30/20 1325      PT LONG TERM GOAL #1   Title Patient will be independent with HEP and its progression    Time 8    Period Weeks    Status New      PT LONG TERM GOAL #2   Title Patient will report ability to perform ADLs with right shoulder pain less than or equal to 3/10.    Baseline --    Time 8    Period Weeks    Status New      PT LONG TERM GOAL #3   Title Patient will demonstrate 60+ degrees of right shoulder ER AROM to improve donning/doffing apparel.    Baseline --    Time 8    Period Weeks    Status New      PT LONG TERM GOAL #4   Title Patient will demonstrate 140+ degrees of right shoulder flexion AROM to improve overhead activities.    Baseline --    Time 8    Period Weeks    Status New      PT LONG TERM GOAL #5   Title Patient will demonstrate 3+/5 or greater right shoulder MMT in all planes to improve stability during functional tasks.    Baseline --    Time 8    Period Weeks    Status New                 Plan - 06/06/20 1023    Clinical Impression Statement Patient responded well to therapy session with minimal reports of increased pain from baseline. Patient noted with smooth arcs of motion in all planes. Intermittent oscillations performed to decrease muscle guarding. Patient and PT discussed plan of care and briefly reviewed protocol to which patient reported understanding. No adverse affects upon removal of modalities.    Personal Factors and Comorbidities Age;Comorbidity 2    Comorbidities R RTC repair and biceps tenotomy 05/18/2020, HTN, DM, history of lumbar fusion 06/21/2015    Examination-Activity Limitations Sleep;Dressing;Hygiene/Grooming;Lift    Examination-Participation Restrictions Cleaning;Meal Prep;Laundry    Stability/Clinical Decision Making Stable/Uncomplicated    Clinical Decision Making Low    Rehab Potential Fair    PT Frequency Other (comment)    PT  Duration Other (comment)    PT Treatment/Interventions  ADLs/Self Care Home Management;Cryotherapy;Electrical Stimulation;Moist Heat;Iontophoresis 4mg /ml Dexamethasone;Neuromuscular re-education;Therapeutic exercise;Therapeutic activities;Functional mobility training;Patient/family education;Manual techniques;Passive range of motion;Scar mobilization    PT Next Visit Plan Per protocol; PROM to right shoulder to tolerance, e-stim, cold pack PRN for pain relief; NO VASO    PT Home Exercise Plan cont. pendulums provided by MD; theraputty    Consulted and Agree with Plan of Care Patient           Patient will benefit from skilled therapeutic intervention in order to improve the following deficits and impairments:  Decreased activity tolerance, Decreased strength, Increased edema, Postural dysfunction, Pain, Impaired UE functional use, Decreased range of motion  Visit Diagnosis: Acute pain of right shoulder  Stiffness of right shoulder, not elsewhere classified  Muscle weakness (generalized)     Problem List Patient Active Problem List   Diagnosis Date Noted  . S/P arthroscopy of right shoulder 05/26/2020  . Chronic right shoulder pain 11/22/2019  . Constipation 11/22/2019  . Type 2 diabetes mellitus with diabetic neuropathy, with long-term current use of insulin (HCC) 11/22/2019  . Back pain 06/21/2015  . HTN (hypertension) 10/23/2010  . Vitamin D deficiency 10/23/2010  . Hyperlipemia 10/23/2010  . Plantar fasciitis 10/23/2010    10/25/2010, PT, DPT 06/06/2020, 10:45 AM  [facility] [address] [place], [place], [place] Phone: [phone]   Fax:  [phone]  Name: Tammy Mayo MRN: Hassan Buckler Date of Birth: [birth year]

## 2020-06-08 ENCOUNTER — Ambulatory Visit: Payer: Managed Care, Other (non HMO) | Admitting: Physical Therapy

## 2020-06-08 ENCOUNTER — Other Ambulatory Visit: Payer: Self-pay

## 2020-06-08 DIAGNOSIS — M6281 Muscle weakness (generalized): Secondary | ICD-10-CM

## 2020-06-08 DIAGNOSIS — M25611 Stiffness of right shoulder, not elsewhere classified: Secondary | ICD-10-CM

## 2020-06-08 DIAGNOSIS — M25511 Pain in right shoulder: Secondary | ICD-10-CM

## 2020-06-08 NOTE — Therapy (Signed)
[facility] [address] [place], [place], [address] Phone: [phone]   Fax:  [phone]  Physical Therapy Treatment  Patient Details  Name: Tammy Mayo MRN: [id number] Date of Birth: [birth year] Referring Provider (PT): Thane Edu, MD   Encounter Date: 06/08/2020   PT End of Session - 06/08/20 1021    Visit Number 4    Number of Visits 8    Date for PT Re-Evaluation 07/25/20    Authorization Type [employer] (No Vas[employer]) FOTO (68%); Progress note every 10th visit    PT Start Time 0945    PT Stop Time 1024    PT Time Calculation (min) 39 min    Activity Tolerance Patient tolerated treatment well    Behavior During Therapy WFL for tasks assessed/performed           Past Medical History:  Diagnosis Date  . Anemia   . Arthritis   . Chronic back pain    lumbar 4-5 slip and radicular right leg pain  . COVID-19 07/27/2019  . Depression   . Diabetes mellitus without complication (HCC)   . GERD (gastroesophageal reflux disease)   . Hyperlipidemia   . Hypertension   . Seasonal allergies   . Vitamin D deficiency   . Wears glasses     Past Surgical History:  Procedure Laterality Date  . ARTHOSCOPIC ROTAOR CUFF REPAIR Right 05/18/2020   Procedure: ARTHROSCOPIC ROTATOR CUFF REPAIR;  Surgeon: Oliver Barre, MD;  Location: [facility];  Service: Orthopedics;  Laterality: Right;  . BACK SURGERY  05/2015   lumbar fusion  . BICEPT TENODESIS Right 05/18/2020   Procedure: BICEPS TENOTOMY;  Surgeon: Oliver Barre, MD;  Location: [facility];  Service: Orthopedics;  Laterality: Right;  . CESAREAN SECTION     x 1  . CHOLECYSTECTOMY N/A 11/13/2015   Procedure: LAPAROSCOPIC CHOLECYSTECTOMY;  Surgeon: Emelia Loron, MD;  Location: [facility] OR;  Service: General;  Laterality: N/A;  . DILATION AND CURETTAGE OF UTERUS    . HERNIA REPAIR     umbilical hernia repair as a child  . TUBAL LIGATION      There were no vitals filed for this visit.    Subjective Assessment - 06/08/20 0952    Subjective COVID 19 screening performed on patient upon arrival. Patient arrived with ongoing discomfort and continues to wear brace.    Pertinent History R RTC repair and biceps tenodesis 05/18/2020, HTN, DM, history of lumbar fusion 06/21/2015    Limitations Lifting;House hold activities    Diagnostic tests X-Ray, MRI; see post op notes for surgical intervention    Patient Stated Goals use arm normally again.    Currently in Pain? Yes    Pain Score 5     Pain Location Shoulder    Pain Orientation Right    Pain Descriptors / Indicators Discomfort    Pain Type Surgical pain    Pain Onset 1 to 4 weeks ago    Pain Frequency Constant    Aggravating Factors  movement of shoulder    Pain Relieving Factors rest              [facility] PT Assessment - 06/08/20 0001      PROM   PROM Assessment Site Shoulder    Right/Left Shoulder Right    Right Shoulder Flexion 90 Degrees    Right Shoulder External Rotation 40 Degrees  [facility] Adult PT Treatment/Exercise - 06/08/20 0001      Cryotherapy   Number Minutes Cryotherapy 10 Minutes    Cryotherapy Location Shoulder    Type of Cryotherapy Ice pack      Electrical Stimulation   Electrical Stimulation Location right shoulder     Electrical Stimulation Action IFC    Electrical Stimulation Parameters 80-150hz  x61min    Electrical Stimulation Goals Pain;Edema      Manual Therapy   Manual Therapy Passive ROM    Passive ROM PROM of R shoulder into flexion, ER, IR with gentle holds and intermittent oscillations to reduce guarding                       PT Long Term Goals - 06/08/20 0954      PT LONG TERM GOAL #1   Title Patient will be independent with HEP and its progression    Time 8    Period Weeks    Status On-going      PT LONG TERM GOAL #2   Title Patient will report ability to perform ADLs with right shoulder pain less than or equal to 3/10.     Time 8    Period Weeks    Status On-going      PT LONG TERM GOAL #3   Title Patient will demonstrate 60+ degrees of right shoulder ER AROM to improve donning/doffing apparel.    Time 8    Period Weeks    Status On-going      PT LONG TERM GOAL #4   Title Patient will demonstrate 140+ degrees of right shoulder flexion AROM to improve overhead activities.    Time 8    Period Weeks    Status On-going      PT LONG TERM GOAL #5   Title Patient will demonstrate 3+/5 or greater right shoulder MMT in all planes to improve stability during functional tasks.    Time 8    Period Weeks    Status On-going                 Plan - 06/08/20 1022    Clinical Impression Statement Patient tolerated treatment well today. Patient reported no increased discomfort today. Patient has improved with PROM today for flexion and ER. Patient was educated on current protocol to insure safety. Patient continues to wear sling per reported. Patient current goals ongoing this week. Normal response upon removal of modalities.    Personal Factors and Comorbidities Age;Comorbidity 2    Comorbidities R RTC repair and biceps tenotomy 05/18/2020, HTN, DM, history of lumbar fusion 06/21/2015    Examination-Activity Limitations Sleep;Dressing;Hygiene/Grooming;Lift    Examination-Participation Restrictions Cleaning;Meal Prep;Laundry    Stability/Clinical Decision Making Stable/Uncomplicated    Rehab Potential Fair    PT Frequency Other (comment)    PT Duration Other (comment)    PT Treatment/Interventions ADLs/Self Care Home Management;Cryotherapy;Electrical Stimulation;Moist Heat;Iontophoresis 4mg /ml Dexamethasone;Neuromuscular re-education;Therapeutic exercise;Therapeutic activities;Functional mobility training;Patient/family education;Manual techniques;Passive range of motion;Scar mobilization    PT Next Visit Plan Per protocol; PROM to right shoulder to tolerance, e-stim, cold pack PRN for pain relief; NO VASO (3  weeks 06/08/20)    Consulted and Agree with Plan of Care Patient           Patient will benefit from skilled therapeutic intervention in order to improve the following deficits and impairments:  Decreased activity tolerance, Decreased strength, Increased edema, Postural dysfunction, Pain, Impaired UE functional use, Decreased range of motion  Visit  Diagnosis: Acute pain of right shoulder  Stiffness of right shoulder, not elsewhere classified  Muscle weakness (generalized)     Problem List Patient Active Problem List   Diagnosis Date Noted  . S/P arthroscopy of right shoulder 05/26/2020  . Chronic right shoulder pain 11/22/2019  . Constipation 11/22/2019  . Type 2 diabetes mellitus with diabetic neuropathy, with long-term current use of insulin (HCC) 11/22/2019  . Back pain 06/21/2015  . HTN (hypertension) 10/23/2010  . Vitamin D deficiency 10/23/2010  . Hyperlipemia 10/23/2010  . Plantar fasciitis 10/23/2010    Hermelinda Dellen, PTA 06/08/2020, 10:31 AM  [facility] [address] [place], [place], [address] Phone: [phone]   Fax:  [phone]  Name: Tammy Mayo MRN: [id number] Date of Birth: 11/09/1965

## 2020-06-13 ENCOUNTER — Ambulatory Visit: Payer: Managed Care, Other (non HMO) | Admitting: Physical Therapy

## 2020-06-13 ENCOUNTER — Encounter: Payer: Self-pay | Admitting: Physical Therapy

## 2020-06-13 ENCOUNTER — Other Ambulatory Visit: Payer: Self-pay

## 2020-06-13 DIAGNOSIS — M25511 Pain in right shoulder: Secondary | ICD-10-CM

## 2020-06-13 DIAGNOSIS — M6281 Muscle weakness (generalized): Secondary | ICD-10-CM

## 2020-06-13 DIAGNOSIS — M25611 Stiffness of right shoulder, not elsewhere classified: Secondary | ICD-10-CM

## 2020-06-13 NOTE — Therapy (Signed)
[facility] [address] [place], [place], [address] Phone: [phone]   Fax:  [phone]  Physical Therapy Treatment  Patient Details  Name: Tammy Mayo MRN: [id number] Date of Birth: 12-28-1965 Referring Provider (PT): Thane Edu, MD   Encounter Date: 06/13/2020   PT End of Session - 06/13/20 1209    Visit Number 5    Number of Visits 8    Date for PT Re-Evaluation 07/25/20    Authorization Type [employer] (No Vas[employer]) FOTO (68%); Progress note every 10th visit    PT Start Time 1035    PT Stop Time 1117    PT Time Calculation (min) 42 min    Equipment Utilized During Treatment Other (comment)   sling   Activity Tolerance Patient tolerated treatment well    Behavior During Therapy WFL for tasks assessed/performed           Past Medical History:  Diagnosis Date  . Anemia   . Arthritis   . Chronic back pain    lumbar 4-5 slip and radicular right leg pain  . COVID-19 07/27/2019  . Depression   . Diabetes mellitus without complication (HCC)   . GERD (gastroesophageal reflux disease)   . Hyperlipidemia   . Hypertension   . Seasonal allergies   . Vitamin D deficiency   . Wears glasses     Past Surgical History:  Procedure Laterality Date  . ARTHOSCOPIC ROTAOR CUFF REPAIR Right 05/18/2020   Procedure: ARTHROSCOPIC ROTATOR CUFF REPAIR;  Surgeon: Oliver Barre, MD;  Location: [facility];  Service: Orthopedics;  Laterality: Right;  . BACK SURGERY  05/2015   lumbar fusion  . BICEPT TENODESIS Right 05/18/2020   Procedure: BICEPS TENOTOMY;  Surgeon: Oliver Barre, MD;  Location: [facility];  Service: Orthopedics;  Laterality: Right;  . CESAREAN SECTION     x 1  . CHOLECYSTECTOMY N/A 11/13/2015   Procedure: LAPAROSCOPIC CHOLECYSTECTOMY;  Surgeon: Emelia Loron, MD;  Location: [facility] OR;  Service: General;  Laterality: N/A;  . DILATION AND CURETTAGE OF UTERUS    . HERNIA REPAIR     umbilical hernia repair as a child  . TUBAL  LIGATION      There were no vitals filed for this visit.   Subjective Assessment - 06/13/20 1208    Subjective COVID 19 screening performed on patient upon arrival. Patient arrived with ongoing discomfort and continues to wear brace.    Pertinent History R RTC repair and biceps tenodesis 05/18/2020, HTN, DM, history of lumbar fusion 06/21/2015    Limitations Lifting;House hold activities    Diagnostic tests X-Ray, MRI; see post op notes for surgical intervention    Patient Stated Goals use arm normally again.    Currently in Pain? Yes    Pain Score 5     Pain Location Shoulder    Pain Orientation Right    Pain Descriptors / Indicators Discomfort    Pain Type Surgical pain    Pain Onset More than a month ago    Pain Frequency Constant              [facility] PT Assessment - 06/13/20 0001      Assessment   Medical Diagnosis S/P arthroscopy of right shoulder, complete tear of right rotator cuff, unspecified whether traumatic    Referring Provider (PT) Thane Edu, MD    Onset Date/Surgical Date 05/18/20    Hand Dominance Right    Next MD Visit 06/27/2020    Prior Therapy  yes      Precautions   Precautions Shoulder    Type of Shoulder Precautions Please follow Dr. Dallas Schimke' protocol                         [facility] Adult PT Treatment/Exercise - 06/13/20 0001      Modalities   Modalities Cryotherapy;Electrical Stimulation      Cryotherapy   Number Minutes Cryotherapy 15 Minutes    Cryotherapy Location Shoulder    Type of Cryotherapy Ice pack      Electrical Stimulation   Electrical Stimulation Location R shoulder    Electrical Stimulation Action Pre-Mod    Electrical Stimulation Parameters 80-150 hz x15 min    Electrical Stimulation Goals Pain;Edema      Manual Therapy   Manual Therapy Passive ROM    Passive ROM PROM of R shoulder into flexion, ER, IR with gentle holds and intermittent oscillations to reduce guarding                       PT  Long Term Goals - 06/08/20 0954      PT LONG TERM GOAL #1   Title Patient will be independent with HEP and its progression    Time 8    Period Weeks    Status On-going      PT LONG TERM GOAL #2   Title Patient will report ability to perform ADLs with right shoulder pain less than or equal to 3/10.    Time 8    Period Weeks    Status On-going      PT LONG TERM GOAL #3   Title Patient will demonstrate 60+ degrees of right shoulder ER AROM to improve donning/doffing apparel.    Time 8    Period Weeks    Status On-going      PT LONG TERM GOAL #4   Title Patient will demonstrate 140+ degrees of right shoulder flexion AROM to improve overhead activities.    Time 8    Period Weeks    Status On-going      PT LONG TERM GOAL #5   Title Patient will demonstrate 3+/5 or greater right shoulder MMT in all planes to improve stability during functional tasks.    Time 8    Period Weeks    Status On-going                 Plan - 06/13/20 1210    Clinical Impression Statement Patient presented in clinic with sling donned. Patient cautioned against any ADLs such as washing dishes to avoid reinjuring R shoulder by PTA. Patient indicated some discomfort with PROM into flexion. Intermittant oscillations provided to reduce guarding and pain. Firm end feels and smooth arc of motion noted during PROM of R shoulder. Normal modalities response noted following removal of the modalities.    Personal Factors and Comorbidities Age;Comorbidity 2    Comorbidities R RTC repair and biceps tenotomy 05/18/2020, HTN, DM, history of lumbar fusion 06/21/2015    Examination-Activity Limitations Sleep;Dressing;Hygiene/Grooming;Lift    Examination-Participation Restrictions Cleaning;Meal Prep;Laundry    Stability/Clinical Decision Making Stable/Uncomplicated    Rehab Potential Fair    PT Frequency Other (comment)    PT Duration Other (comment)    PT Treatment/Interventions ADLs/Self Care Home  Management;Cryotherapy;Electrical Stimulation;Moist Heat;Iontophoresis 4mg /ml Dexamethasone;Neuromuscular re-education;Therapeutic exercise;Therapeutic activities;Functional mobility training;Patient/family education;Manual techniques;Passive range of motion;Scar mobilization    PT Next Visit Plan Per protocol; PROM to right shoulder  to tolerance, e-stim, cold pack PRN for pain relief; NO VASO (3 weeks 06/08/20)    PT Home Exercise Plan cont. pendulums provided by MD; theraputty    Consulted and Agree with Plan of Care Patient           Patient will benefit from skilled therapeutic intervention in order to improve the following deficits and impairments:  Decreased activity tolerance, Decreased strength, Increased edema, Postural dysfunction, Pain, Impaired UE functional use, Decreased range of motion  Visit Diagnosis: Acute pain of right shoulder  Stiffness of right shoulder, not elsewhere classified  Muscle weakness (generalized)     Problem List Patient Active Problem List   Diagnosis Date Noted  . S/P arthroscopy of right shoulder 05/26/2020  . Chronic right shoulder pain 11/22/2019  . Constipation 11/22/2019  . Type 2 diabetes mellitus with diabetic neuropathy, with long-term current use of insulin (HCC) 11/22/2019  . Back pain 06/21/2015  . HTN (hypertension) 10/23/2010  . Vitamin D deficiency 10/23/2010  . Hyperlipemia 10/23/2010  . Plantar fasciitis 10/23/2010    Marvell Fuller, PTA 06/13/2020, 12:13 PM  [facility] [address] [place], [place], [address] Phone: [phone]   Fax:  [phone]  Name: Tammy Mayo MRN: [id number] Date of Birth: 06/04/1966

## 2020-06-20 ENCOUNTER — Encounter: Payer: Managed Care, Other (non HMO) | Admitting: Physical Therapy

## 2020-06-21 ENCOUNTER — Other Ambulatory Visit: Payer: Self-pay

## 2020-06-21 ENCOUNTER — Ambulatory Visit: Payer: Managed Care, Other (non HMO) | Admitting: Physical Therapy

## 2020-06-21 DIAGNOSIS — M25611 Stiffness of right shoulder, not elsewhere classified: Secondary | ICD-10-CM

## 2020-06-21 DIAGNOSIS — G8929 Other chronic pain: Secondary | ICD-10-CM

## 2020-06-21 DIAGNOSIS — M25511 Pain in right shoulder: Secondary | ICD-10-CM | POA: Diagnosis not present

## 2020-06-21 DIAGNOSIS — M6281 Muscle weakness (generalized): Secondary | ICD-10-CM

## 2020-06-21 NOTE — Therapy (Signed)
[facility] [address] [place], [place], [address] Phone: [phone]   Fax:  [phone]  Physical Therapy Treatment  Patient Details  Name: Tammy Mayo MRN: [id number] Date of Birth: 1966-07-27 Referring Provider (PT): Thane Edu, MD   Encounter Date: 06/21/2020   PT End of Session - 06/21/20 1129    Visit Number 6    Number of Visits 8    Authorization Type [employer] (No Vaso) FOTO (68%); Progress note every 10th visit    PT Start Time 0945    PT Stop Time 1030    PT Time Calculation (min) 45 min           Past Medical History:  Diagnosis Date  . Anemia   . Arthritis   . Chronic back pain    lumbar 4-5 slip and radicular right leg pain  . COVID-19 07/27/2019  . Depression   . Diabetes mellitus without complication (HCC)   . GERD (gastroesophageal reflux disease)   . Hyperlipidemia   . Hypertension   . Seasonal allergies   . Vitamin D deficiency   . Wears glasses     Past Surgical History:  Procedure Laterality Date  . ARTHOSCOPIC ROTAOR CUFF REPAIR Right 05/18/2020   Procedure: ARTHROSCOPIC ROTATOR CUFF REPAIR;  Surgeon: Oliver Barre, MD;  Location: [facility];  Service: Orthopedics;  Laterality: Right;  . BACK SURGERY  05/2015   lumbar fusion  . BICEPT TENODESIS Right 05/18/2020   Procedure: BICEPS TENOTOMY;  Surgeon: Oliver Barre, MD;  Location: [facility];  Service: Orthopedics;  Laterality: Right;  . CESAREAN SECTION     x 1  . CHOLECYSTECTOMY N/A 11/13/2015   Procedure: LAPAROSCOPIC CHOLECYSTECTOMY;  Surgeon: Emelia Loron, MD;  Location: [facility];  Service: General;  Laterality: N/A;  . DILATION AND CURETTAGE OF UTERUS    . HERNIA REPAIR     umbilical hernia repair as a child  . TUBAL LIGATION      There were no vitals filed for this visit.   Subjective Assessment - 06/21/20 1124    Subjective COVID-19 screen performed prior to patient entering clinic.  No new complaints.    Pertinent History R  RTC repair and biceps tenodesis 05/18/2020, HTN, DM, history of lumbar fusion 06/21/2015    Limitations Lifting;House hold activities    Diagnostic tests X-Ray, MRI; see post op notes for surgical intervention    Patient Stated Goals use arm normally again.    Currently in Pain? Yes    Pain Score 5     Pain Location Shoulder    Pain Orientation Right    Pain Descriptors / Indicators Discomfort    Pain Type Surgical pain    Pain Onset More than a month ago                             [facility] Adult PT Treatment/Exercise - 06/21/20 0001      Cryotherapy   Number Minutes Cryotherapy 15 Minutes    Cryotherapy Location --   Left shoulder.   Type of Cryotherapy Ice pack      Electrical Stimulation   Electrical Stimulation Location RT shoulder.    Electrical Stimulation Action IFC    Electrical Stimulation Parameters 80-150 (non-motoric) at 100% scan x 15 minutes.    Electrical Stimulation Goals Edema;Pain      Manual Therapy   Manual Therapy Passive ROM    Passive ROM In supine:  PROM to patient's right shoulder x 23 minutes with low load long duration stretching technique utilized.                       PT Long Term Goals - 06/08/20 0954      PT LONG TERM GOAL #1   Title Patient will be independent with HEP and its progression    Time 8    Period Weeks    Status On-going      PT LONG TERM GOAL #2   Title Patient will report ability to perform ADLs with right shoulder pain less than or equal to 3/10.    Time 8    Period Weeks    Status On-going      PT LONG TERM GOAL #3   Title Patient will demonstrate 60+ degrees of right shoulder ER AROM to improve donning/doffing apparel.    Time 8    Period Weeks    Status On-going      PT LONG TERM GOAL #4   Title Patient will demonstrate 140+ degrees of right shoulder flexion AROM to improve overhead activities.    Time 8    Period Weeks    Status On-going      PT LONG TERM GOAL #5   Title  Patient will demonstrate 3+/5 or greater right shoulder MMT in all planes to improve stability during functional tasks.    Time 8    Period Weeks    Status On-going                 Plan - 06/21/20 1127    Clinical Impression Statement Patient doing very well per protocol with passive range of motion of her right shoulder.    Personal Factors and Comorbidities Age;Comorbidity 2    Comorbidities R RTC repair and biceps tenotomy 05/18/2020, HTN, DM, history of lumbar fusion 06/21/2015    Examination-Activity Limitations Sleep;Dressing;Hygiene/Grooming;Lift    Examination-Participation Restrictions Cleaning;Meal Prep;Laundry    Stability/Clinical Decision Making Stable/Uncomplicated    Rehab Potential Fair    PT Frequency Other (comment)    PT Duration Other (comment)    PT Treatment/Interventions ADLs/Self Care Home Management;Cryotherapy;Electrical Stimulation;Moist Heat;Iontophoresis 4mg /ml Dexamethasone;Neuromuscular re-education;Therapeutic exercise;Therapeutic activities;Functional mobility training;Patient/family education;Manual techniques;Passive range of motion;Scar mobilization    PT Next Visit Plan Per protocol; PROM to right shoulder to tolerance, e-stim, cold pack PRN for pain relief; NO VASO (3 weeks 06/08/20)    Consulted and Agree with Plan of Care Patient           Patient will benefit from skilled therapeutic intervention in order to improve the following deficits and impairments:  Decreased activity tolerance, Decreased strength, Increased edema, Postural dysfunction, Pain, Impaired UE functional use, Decreased range of motion  Visit Diagnosis: Acute pain of right shoulder  Stiffness of right shoulder, not elsewhere classified  Muscle weakness (generalized)  Chronic right shoulder pain     Problem List Patient Active Problem List   Diagnosis Date Noted  . S/P arthroscopy of right shoulder 05/26/2020  . Chronic right shoulder pain 11/22/2019  .  Constipation 11/22/2019  . Type 2 diabetes mellitus with diabetic neuropathy, with long-term current use of insulin (HCC) 11/22/2019  . Back pain 06/21/2015  . HTN (hypertension) 10/23/2010  . Vitamin D deficiency 10/23/2010  . Hyperlipemia 10/23/2010  . Plantar fasciitis 10/23/2010    Rajiv Parlato, 10/25/2010 MPT 06/21/2020, 11:29 AM  [facility] [address] [place], [place], [place] Phone: [phone]  Fax:  [phone]  Name: Tammy Mayo MRN: [id number] Date of Birth: [birth year]

## 2020-06-23 ENCOUNTER — Ambulatory Visit: Payer: Managed Care, Other (non HMO) | Admitting: Nurse Practitioner

## 2020-06-26 ENCOUNTER — Ambulatory Visit: Payer: Managed Care, Other (non HMO) | Admitting: Nurse Practitioner

## 2020-06-27 ENCOUNTER — Other Ambulatory Visit: Payer: Self-pay

## 2020-06-27 ENCOUNTER — Encounter: Payer: Self-pay | Admitting: Orthopedic Surgery

## 2020-06-27 ENCOUNTER — Ambulatory Visit (INDEPENDENT_AMBULATORY_CARE_PROVIDER_SITE_OTHER): Payer: Managed Care, Other (non HMO) | Admitting: Orthopedic Surgery

## 2020-06-27 VITALS — BP 129/89 | HR 102 | Ht 64.0 in | Wt 165.0 lb

## 2020-06-27 DIAGNOSIS — Z9889 Other specified postprocedural states: Secondary | ICD-10-CM

## 2020-06-27 NOTE — Progress Notes (Signed)
Orthopaedic Postop Note  Assessment: Tammy Mayo is a 54 y.o. female s/p right shoulder arthroscopy, rotator cuff repair and biceps tenotomy  DOS: 05/18/2020  Plan: She is progressing very well with therapy.  OK to stop using the sling.  Continue PT following the protocol, start to incorporate strengthening activities.  Follow up 6 weeks, ok to cancel if doing well at that time.    Follow-up: Return in about 6 weeks (around 08/08/2020). XR at next visit: None  Subjective:  Chief Complaint  Patient presents with  . Shoulder Pain    right shoulder DOS 05/18/2020.     History of Present Illness: Tammy Mayo is a 54 y.o. female who presents following the above stated procedure. She continues to do well following surgery.  She is taking tylenol for pain.  Working with PT, mostly doing exercises on her own.  No issues overall.  Asking if she can DC the sling at night.  No questions or concerns otherwise.   Review of Systems: No fevers or chills No numbness or tingling No Chest Pain No shortness of breath   Objective: Physical Exam:  Incisions are healing well, no surrounding erythema or drainage  Active forward flexion to 120 degrees, IR to lumbar spine, 30 ER at side. No TTP about the shoulder    IMAGING: I personally ordered and reviewed the following images:  No new imaging today  Oliver Barre, MD 06/27/2020 9:26 AM

## 2020-06-28 ENCOUNTER — Other Ambulatory Visit: Payer: Self-pay | Admitting: Family Medicine

## 2020-06-29 ENCOUNTER — Other Ambulatory Visit: Payer: Self-pay

## 2020-06-29 ENCOUNTER — Encounter: Payer: Self-pay | Admitting: Physical Therapy

## 2020-06-29 ENCOUNTER — Ambulatory Visit: Payer: Managed Care, Other (non HMO) | Attending: Orthopedic Surgery | Admitting: Physical Therapy

## 2020-06-29 DIAGNOSIS — G8929 Other chronic pain: Secondary | ICD-10-CM | POA: Insufficient documentation

## 2020-06-29 DIAGNOSIS — M6281 Muscle weakness (generalized): Secondary | ICD-10-CM | POA: Insufficient documentation

## 2020-06-29 DIAGNOSIS — M25611 Stiffness of right shoulder, not elsewhere classified: Secondary | ICD-10-CM | POA: Insufficient documentation

## 2020-06-29 DIAGNOSIS — M25511 Pain in right shoulder: Secondary | ICD-10-CM | POA: Insufficient documentation

## 2020-06-29 NOTE — Therapy (Signed)
[facility] [address] [place], [place], [address] Phone: [phone]   Fax:  [phone]  Physical Therapy Treatment  Patient Details  Name: Tammy Mayo MRN: [id number] Date of Birth: [birth year] Referring Provider (PT): Thane Edu, MD   Encounter Date: 06/29/2020   PT End of Session - 06/29/20 1446    Visit Number 7    Number of Visits 8    Date for PT Re-Evaluation 07/25/20    Authorization Type [employer] (No Vaso) FOTO (68%); Progress note every 10th visit    PT Start Time 1430    PT Stop Time 1524    PT Time Calculation (min) 54 min    Activity Tolerance Patient tolerated treatment well    Behavior During Therapy WFL for tasks assessed/performed           Past Medical History:  Diagnosis Date  . Anemia   . Arthritis   . Chronic back pain    lumbar 4-5 slip and radicular right leg pain  . COVID-19 07/27/2019  . Depression   . Diabetes mellitus without complication (HCC)   . GERD (gastroesophageal reflux disease)   . Hyperlipidemia   . Hypertension   . Seasonal allergies   . Vitamin D deficiency   . Wears glasses     Past Surgical History:  Procedure Laterality Date  . ARTHOSCOPIC ROTAOR CUFF REPAIR Right 05/18/2020   Procedure: ARTHROSCOPIC ROTATOR CUFF REPAIR;  Surgeon: Oliver Barre, MD;  Location: [facility];  Service: Orthopedics;  Laterality: Right;  . BACK SURGERY  05/2015   lumbar fusion  . BICEPT TENODESIS Right 05/18/2020   Procedure: BICEPS TENOTOMY;  Surgeon: Oliver Barre, MD;  Location: [facility];  Service: Orthopedics;  Laterality: Right;  . CESAREAN SECTION     x 1  . CHOLECYSTECTOMY N/A 11/13/2015   Procedure: LAPAROSCOPIC CHOLECYSTECTOMY;  Surgeon: Emelia Loron, MD;  Location: [facility] OR;  Service: General;  Laterality: N/A;  . DILATION AND CURETTAGE OF UTERUS    . HERNIA REPAIR     umbilical hernia repair as a child  . TUBAL LIGATION      There were no vitals filed for this visit.    Subjective Assessment - 06/29/20 1429    Subjective COVID-19 screen performed prior to patient entering clinic.  Sling was discontinued and MD was very happy.    Pertinent History R RTC repair and biceps tenodesis 05/18/2020, HTN, DM, history of lumbar fusion 06/21/2015    Limitations Lifting;House hold activities    Diagnostic tests X-Ray, MRI; see post op notes for surgical intervention    Patient Stated Goals use arm normally again.    Currently in Pain? Yes    Pain Score 4     Pain Location Shoulder    Pain Orientation Right    Pain Descriptors / Indicators Sore    Pain Type Surgical pain    Pain Onset More than a month ago    Pain Frequency Constant              [facility] PT Assessment - 06/29/20 0001      Assessment   Medical Diagnosis S/P arthroscopy of right shoulder, complete tear of right rotator cuff, unspecified whether traumatic    Referring Provider (PT) Thane Edu, MD    Onset Date/Surgical Date 05/18/20    Hand Dominance Right    Next MD Visit 08/19/2020    Prior Therapy yes      Precautions   Precautions Shoulder  Type of Shoulder Precautions Please follow Dr. Dallas Schimke' protocol                         [facility] Adult PT Treatment/Exercise - 06/29/20 0001      Exercises   Exercises Shoulder      Shoulder Exercises: Supine   Protraction AAROM;Both;20 reps    External Rotation AAROM;Right;20 reps    Flexion AAROM;Both;20 reps    Other Supine Exercises AROM R bicep curls x20 reps      Shoulder Exercises: Pulleys   Flexion 5 minutes      Shoulder Exercises: ROM/Strengthening   Ranger in sitting; flex/ext x2 min, circles x2 min      Modalities   Modalities Cryotherapy;Electrical Stimulation      Cryotherapy   Number Minutes Cryotherapy 15 Minutes    Cryotherapy Location Shoulder    Type of Cryotherapy Ice pack      Electrical Stimulation   Electrical Stimulation Location R shoulder    Electrical Stimulation Action Pre-Mod    Electrical  Stimulation Parameters 80-150 hz x15 min    Electrical Stimulation Goals Edema;Pain      Manual Therapy   Manual Therapy Passive ROM    Passive ROM PROM of R shoulder into flexion, ER, IR with holds at end range                  PT Education - 06/29/20 1534    Education Details HEP- AAROM, isometrics, AROM progression with written instructions regarding timeline    Person(s) Educated Patient    Methods Explanation;Demonstration;Handout    Comprehension Verbalized understanding;Need further instruction               PT Long Term Goals - 06/08/20 0954      PT LONG TERM GOAL #1   Title Patient will be independent with HEP and its progression    Time 8    Period Weeks    Status On-going      PT LONG TERM GOAL #2   Title Patient will report ability to perform ADLs with right shoulder pain less than or equal to 3/10.    Time 8    Period Weeks    Status On-going      PT LONG TERM GOAL #3   Title Patient will demonstrate 60+ degrees of right shoulder ER AROM to improve donning/doffing apparel.    Time 8    Period Weeks    Status On-going      PT LONG TERM GOAL #4   Title Patient will demonstrate 140+ degrees of right shoulder flexion AROM to improve overhead activities.    Time 8    Period Weeks    Status On-going      PT LONG TERM GOAL #5   Title Patient will demonstrate 3+/5 or greater right shoulder MMT in all planes to improve stability during functional tasks.    Time 8    Period Weeks    Status On-going                 Plan - 06/29/20 1538    Clinical Impression Statement Patient progressed to more AAROM exercises as cleared by MD. Patient able to tolerate progression to [facility] well with no complaints of pain. Patient required intermittant multimodal cueing to correct exercise technique. Firm end feels and smooth arc of motion noted during PROM of R shoulder into all directions. Patient also provided new comprehensive HEP for progression  with  written instructions regarding timeline for start dates. Demo provided for each exercise as well two handouts to guide purchase of pulley system. Patient verbalized understanding of all HEP instructions. Normal modalities response noted following removal of the modalities.    Personal Factors and Comorbidities Age;Comorbidity 2    Comorbidities R RTC repair and biceps tenotomy 05/18/2020, HTN, DM, history of lumbar fusion 06/21/2015    Examination-Activity Limitations Sleep;Dressing;Hygiene/Grooming;Lift    Examination-Participation Restrictions Cleaning;Meal Prep;Laundry    Stability/Clinical Decision Making Stable/Uncomplicated    Rehab Potential Fair    PT Frequency Other (comment)    PT Duration Other (comment)    PT Treatment/Interventions ADLs/Self Care Home Management;Cryotherapy;Electrical Stimulation;Moist Heat;Iontophoresis 4mg /ml Dexamethasone;Neuromuscular re-education;Therapeutic exercise;Therapeutic activities;Functional mobility training;Patient/family education;Manual techniques;Passive range of motion;Scar mobilization    PT Next Visit Plan Progress to AAROM per protocol. Reassess HEP.    PT Home Exercise Plan cont. pendulums provided by MD; theraputty    Consulted and Agree with Plan of Care Patient           Patient will benefit from skilled therapeutic intervention in order to improve the following deficits and impairments:  Decreased activity tolerance, Decreased strength, Increased edema, Postural dysfunction, Pain, Impaired UE functional use, Decreased range of motion  Visit Diagnosis: Acute pain of right shoulder  Stiffness of right shoulder, not elsewhere classified  Muscle weakness (generalized)     Problem List Patient Active Problem List   Diagnosis Date Noted  . S/P arthroscopy of right shoulder 05/26/2020  . Chronic right shoulder pain 11/22/2019  . Constipation 11/22/2019  . Type 2 diabetes mellitus with diabetic neuropathy, with long-term current  use of insulin (HCC) 11/22/2019  . Back pain 06/21/2015  . HTN (hypertension) 10/23/2010  . Vitamin D deficiency 10/23/2010  . Hyperlipemia 10/23/2010  . Plantar fasciitis 10/23/2010    10/25/2010, PTA 06/29/2020, 3:50 PM  [facility] [address] [place], [place], [place] Phone: [phone]   Fax:  [phone]  Name: Tammy Mayo MRN: Hassan Buckler Date of Birth: Dec 11, 1965

## 2020-07-05 ENCOUNTER — Ambulatory Visit: Payer: Managed Care, Other (non HMO) | Admitting: Physical Therapy

## 2020-07-05 ENCOUNTER — Other Ambulatory Visit: Payer: Self-pay

## 2020-07-05 DIAGNOSIS — M25511 Pain in right shoulder: Secondary | ICD-10-CM

## 2020-07-05 DIAGNOSIS — M25611 Stiffness of right shoulder, not elsewhere classified: Secondary | ICD-10-CM

## 2020-07-05 DIAGNOSIS — G8929 Other chronic pain: Secondary | ICD-10-CM

## 2020-07-05 DIAGNOSIS — M6281 Muscle weakness (generalized): Secondary | ICD-10-CM

## 2020-07-05 NOTE — Therapy (Signed)
[facility] [place], [place], [address] Phone: [phone]   Fax:  [phone]  Physical Therapy Treatment  Patient Details  Name: Tammy Mayo MRN: [id number] Date of Birth: [birth year] Referring Provider (PT): Larena Glassman, MD   Encounter Date: 07/05/2020   PT End of Session - 07/05/20 1349    Visit Number 8    Number of Visits 8    Date for PT Re-Evaluation 07/25/20    Authorization Type [employer] (No Vaso) FOTO (68%); Progress note every 10th visit    PT Start Time 0144    PT Stop Time 0228    PT Time Calculation (min) 44 min    Activity Tolerance Patient tolerated treatment well    Behavior During Therapy [facility] for tasks assessed/performed           Past Medical History:  Diagnosis Date  . Anemia   . Arthritis   . Chronic back pain    lumbar 4-5 slip and radicular right leg pain  . COVID-19 07/27/2019  . Depression   . Diabetes mellitus without complication ([place])   . GERD (gastroesophageal reflux disease)   . Hyperlipidemia   . Hypertension   . Seasonal allergies   . Vitamin D deficiency   . Wears glasses     Past Surgical History:  Procedure Laterality Date  . ARTHOSCOPIC ROTAOR CUFF REPAIR Right 05/18/2020   Procedure: ARTHROSCOPIC ROTATOR CUFF REPAIR;  Surgeon: Mordecai Rasmussen, MD;  Location: [facility];  Service: Orthopedics;  Laterality: Right;  . BACK SURGERY  05/2015   lumbar fusion  . BICEPT TENODESIS Right 05/18/2020   Procedure: BICEPS TENOTOMY;  Surgeon: Mordecai Rasmussen, MD;  Location: [facility];  Service: Orthopedics;  Laterality: Right;  . CESAREAN SECTION     x 1  . CHOLECYSTECTOMY N/A 11/13/2015   Procedure: LAPAROSCOPIC CHOLECYSTECTOMY;  Surgeon: Rolm Bookbinder, MD;  Location: [facility];  Service: General;  Laterality: N/A;  . DILATION AND CURETTAGE OF UTERUS    . HERNIA REPAIR     umbilical hernia repair as a child  . TUBAL LIGATION      There were no vitals filed for this visit.    Subjective Assessment - 07/05/20 1347    Subjective COVID-19 screen performed prior to patient entering clinic. Patient arrived with no new complaints and doing "better"    Pertinent History R RTC repair and biceps tenodesis 05/18/2020, HTN, DM, history of lumbar fusion 06/21/2015    Limitations Lifting;House hold activities    Diagnostic tests X-Ray, MRI; see post op notes for surgical intervention    Patient Stated Goals use arm normally again.    Currently in Pain? Yes    Pain Score 4     Pain Location Shoulder    Pain Orientation Right    Pain Descriptors / Indicators Sore;Discomfort    Pain Type Surgical pain    Pain Onset More than a month ago    Pain Frequency Constant    Aggravating Factors  prolong movement of shoulder    Pain Relieving Factors at rest              [facility] PT Assessment - 07/05/20 0001      ROM / Strength   AROM / PROM / Strength Strength;AROM;PROM      AROM   AROM Assessment Site Shoulder    Right/Left Shoulder Right    Right Shoulder Flexion 135 Degrees    Right Shoulder External Rotation 54 Degrees  PROM   PROM Assessment Site Shoulder    Right/Left Shoulder Right    Right Shoulder Flexion 140 Degrees    Right Shoulder External Rotation 65 Degrees      Strength   Strength Assessment Site Shoulder    Right/Left Shoulder Right    Right Shoulder Flexion 3+/5    Right Shoulder Internal Rotation 3+/5    Right Shoulder External Rotation 3/5                         [facility] Adult PT Treatment/Exercise - 07/05/20 0001      Shoulder Exercises: Supine   Protraction AAROM;Both;20 reps    External Rotation AAROM;Right;20 reps    Flexion AAROM;Both;20 reps      Shoulder Exercises: Pulleys   Flexion 5 minutes      Shoulder Exercises: ROM/Strengthening   Ranger in sitting; flex/ext x2 min, circles x2 min      Cryotherapy   Number Minutes Cryotherapy 10 Minutes    Cryotherapy Location Shoulder    Type of Cryotherapy Ice pack       Electrical Stimulation   Electrical Stimulation Location R shoulder    Electrical Stimulation Action premod    Electrical Stimulation Parameters 80-_0  x48mn      Manual Therapy   Manual Therapy Passive ROM    Passive ROM PROM of R shoulder into flexion, ER, IR with holds at end range                       PT Long Term Goals - 07/05/20 1353      PT LONG TERM GOAL #1   Title Patient will be independent with HEP and its progression    Baseline independent with HEP    Time 8    Period Weeks    Status Achieved      PT LONG TERM GOAL #2   Title Patient will report ability to perform ADLs with right shoulder pain less than or equal to 3/10.    Baseline Limited with ADL's 07/05/20    Time 8    Period Weeks    Status Not Met      PT LONG TERM GOAL #3   Title Patient will demonstrate 60+ degrees of right shoulder ER AROM to improve donning/doffing apparel.    Baseline AROM 54 degrees    Time 8    Period Weeks    Status Not Met      PT LONG TERM GOAL #4   Title Patient will demonstrate 140+ degrees of right shoulder flexion AROM to improve overhead activities.    Baseline AROM 135 degrees    Time 8    Period Weeks    Status Not Met      PT LONG TERM GOAL #5   Title Patient will demonstrate 3+/5 or greater right shoulder MMT in all planes to improve stability during functional tasks.    Time 8    Period Weeks    Status Partially Met                 Plan - 07/05/20 1401    Clinical Impression Statement Patient tolerated treatment well today. Patient has improved with ROM for right shoulder today. Patient progresing with AAROM activities and no increased discomfort. Patient understands HEP as provided and other goals partial and not met due to DC for financial reasons.    Personal Factors and Comorbidities Age;Comorbidity 2  Comorbidities R RTC repair and biceps tenotomy 05/18/2020, HTN, DM, history of lumbar fusion 06/21/2015     Examination-Activity Limitations Sleep;Dressing;Hygiene/Grooming;Lift    Examination-Participation Restrictions Cleaning;Meal Prep;Laundry    Stability/Clinical Decision Making Stable/Uncomplicated    Rehab Potential Fair    PT Frequency Other (comment)    PT Duration Other (comment)    PT Treatment/Interventions ADLs/Self Care Home Management;Cryotherapy;Electrical Stimulation;Moist Heat;Iontophoresis 12m/ml Dexamethasone;Neuromuscular re-education;Therapeutic exercise;Therapeutic activities;Functional mobility training;Patient/family education;Manual techniques;Passive range of motion;Scar mobilization    PT Next Visit Plan DC    Consulted and Agree with Plan of Care Patient           Patient will benefit from skilled therapeutic intervention in order to improve the following deficits and impairments:  Decreased activity tolerance, Decreased strength, Increased edema, Postural dysfunction, Pain, Impaired UE functional use, Decreased range of motion  Visit Diagnosis: Acute pain of right shoulder  Stiffness of right shoulder, not elsewhere classified  Muscle weakness (generalized)  Chronic right shoulder pain     Problem List Patient Active Problem List   Diagnosis Date Noted  . S/P arthroscopy of right shoulder 05/26/2020  . Chronic right shoulder pain 11/22/2019  . Constipation 11/22/2019  . Type 2 diabetes mellitus with diabetic neuropathy, with long-term current use of insulin ([place] 11/22/2019  . Back pain 06/21/2015  . HTN (hypertension) 10/23/2010  . Vitamin D deficiency 10/23/2010  . Hyperlipemia 10/23/2010  . Plantar fasciitis 10/23/2010    CLadean Raya PTA 07/05/20 2:31 PM  [facility] [address] [address]Phone: [phone]  Fax:  [phone] Name: EVELEDA MUNMRN: [id number] of Birth: 6December 08, 1967

## 2020-07-25 ENCOUNTER — Other Ambulatory Visit: Payer: Self-pay

## 2020-07-25 ENCOUNTER — Ambulatory Visit (INDEPENDENT_AMBULATORY_CARE_PROVIDER_SITE_OTHER): Payer: Managed Care, Other (non HMO) | Admitting: Nurse Practitioner

## 2020-07-25 ENCOUNTER — Encounter: Payer: Self-pay | Admitting: Nurse Practitioner

## 2020-07-25 VITALS — BP 131/91 | HR 89 | Temp 97.5°F | Ht 64.0 in | Wt 171.0 lb

## 2020-07-25 DIAGNOSIS — E782 Mixed hyperlipidemia: Secondary | ICD-10-CM | POA: Diagnosis not present

## 2020-07-25 DIAGNOSIS — Z23 Encounter for immunization: Secondary | ICD-10-CM

## 2020-07-25 DIAGNOSIS — I10 Essential (primary) hypertension: Secondary | ICD-10-CM | POA: Diagnosis not present

## 2020-07-25 DIAGNOSIS — M549 Dorsalgia, unspecified: Secondary | ICD-10-CM

## 2020-07-25 DIAGNOSIS — M25511 Pain in right shoulder: Secondary | ICD-10-CM

## 2020-07-25 DIAGNOSIS — G8929 Other chronic pain: Secondary | ICD-10-CM

## 2020-07-25 MED ORDER — HYDROCODONE-ACETAMINOPHEN 5-325 MG PO TABS: 1.0000 | ORAL_TABLET | Freq: Four times a day (QID) | ORAL | 0 refills | Status: DC | PRN

## 2020-07-25 NOTE — Progress Notes (Signed)
Established Patient Office Visit  Subjective:  Patient ID: Tammy Mayo, female    DOB: [birth year]  Age: 54 y.o. MRN: [id number]  CC:  Chief Complaint  Patient presents with  . Medical Management of Chronic Issues    HPI Tammy Mayo Pt presents for follow up of hypertension. Patient was diagnosed in 10/23/2010.  The patient is tolerating the medication well without side effects. Compliance with treatment has been good; including taking medication as directed , maintains a healthy diet and regular exercise regimen , and following up as directed.  Current medication lisinopril 20 mg tablet by mouth daily.  Pain  She reports chronic back pain. was not an injury that may have caused the pain. The pain started a few years ago and is staying constant. The pain does radiate bilateral legs. The pain is described as aching, is 6/10 in intensity, occurring constantly. Symptoms are worse in the: morning, mid-day, afternoon, evening  Aggravating factors: bending backwards, bending forwards, bending sideways and walking Relieving factors: medication Hydrocodone.  She has tried prescription pain relievers with significant relief.   ---------------------------------------------------------------------------------------------------      Past Medical History:  Diagnosis Date  . Anemia   . Arthritis   . Chronic back pain    lumbar 4-5 slip and radicular right leg pain  . COVID-19 07/27/2019  . Depression   . Diabetes mellitus without complication (HCC)   . GERD (gastroesophageal reflux disease)   . Hyperlipidemia   . Hypertension   . Seasonal allergies   . Vitamin D deficiency   . Wears glasses     Past Surgical History:  Procedure Laterality Date  . ARTHOSCOPIC ROTAOR CUFF REPAIR Right 05/18/2020   Procedure: ARTHROSCOPIC ROTATOR CUFF REPAIR;  Surgeon: Oliver Barre, MD;  Location: [facility];  Service: Orthopedics;  Laterality: Right;  . BACK SURGERY  05/2015   lumbar  fusion  . BICEPT TENODESIS Right 05/18/2020   Procedure: BICEPS TENOTOMY;  Surgeon: Oliver Barre, MD;  Location: [facility];  Service: Orthopedics;  Laterality: Right;  . CESAREAN SECTION     x 1  . CHOLECYSTECTOMY N/A 11/13/2015   Procedure: LAPAROSCOPIC CHOLECYSTECTOMY;  Surgeon: Emelia Loron, MD;  Location: [facility] OR;  Service: General;  Laterality: N/A;  . DILATION AND CURETTAGE OF UTERUS    . HERNIA REPAIR     umbilical hernia repair as a child  . TUBAL LIGATION      Family History  Problem Relation Age of Onset  . Stroke Mother   . Hypertension Mother   . Diabetes Mother   . Hypertension Father   . Diabetes Father   . Kidney disease Father   . Hyperlipidemia Father   . Hypertension Sister   . Hyperlipidemia Sister   . Hypertension Brother   . Hypertension Brother   . Gout Brother   . Hypertension Sister     Social History   Socioeconomic History  . Marital status: Married    Spouse name: Not on file  . Number of children: Not on file  . Years of education: Not on file  . Highest education level: Not on file  Occupational History  . Not on file  Tobacco Use  . Smoking status: Never Smoker  . Smokeless tobacco: Never Used  Vaping Use  . Vaping Use: Never used  Substance and Sexual Activity  . Alcohol use: No  . Drug use: No  . Sexual activity: Yes    Birth control/protection: None  Other Topics  Concern  . Not on file  Social History Narrative  . Not on file   Social Determinants of Health   Financial Resource Strain: Not on file  Food Insecurity: Not on file  Transportation Needs: Not on file  Physical Activity: Not on file  Stress: Not on file  Social Connections: Not on file  Intimate Partner Violence: Not on file    Outpatient Medications Prior to Visit  Medication Sig Dispense Refill  . ARIPiprazole ER (ABILIFY MAINTENA) 300 MG PRSY prefilled syringe Inject 300 mg into the muscle every 28 (twenty-eight) days.     . Biotin 23300 MCG TABS Take  10,000 mcg by mouth daily.     . cetirizine (ZYRTEC) 10 MG tablet Take 10 mg by mouth daily.    . cholecalciferol (VITAMIN D) 1000 UNITS tablet Take 1,000 Units by mouth daily.    Marland Kitchen docusate sodium (COLACE) 100 MG capsule Take 100 mg by mouth daily.    . DULoxetine (CYMBALTA) 30 MG capsule Take 30 mg by mouth 2 (two) times daily.    Marland Kitchen gabapentin (NEURONTIN) 300 MG capsule TAKE ONE CAPSULE BY MOUTH TWICE A DAY 60 capsule 2  . lisinopril (ZESTRIL) 20 MG tablet TAKE ONE (1) TABLET EACH DAY (Patient taking differently: Take 20 mg by mouth daily.) 90 tablet 1  . omeprazole (PRILOSEC) 20 MG capsule TAKE ONE (1) CAPSULE EACH DAY 90 capsule 0  . ondansetron (ZOFRAN) 4 MG tablet Take 1 tablet (4 mg total) by mouth every 8 (eight) hours as needed for nausea or vomiting. 10 tablet 1  . oxymetazoline (AFRIN) 0.05 % nasal spray Place 1 spray into both nostrils 2 (two) times daily as needed for congestion (over the counter nasal spray).    Marland Kitchen OZEMPIC, 1 MG/DOSE, 4 MG/3ML SOPN INJECT 1MG  SQ WEEKLY 3 mL 0  . polyethylene glycol (MIRALAX / GLYCOLAX) 17 g packet Take 17 g by mouth daily. (Patient taking differently: Take 17 g by mouth daily as needed for moderate constipation.) 14 each 5  . rosuvastatin (CRESTOR) 10 MG tablet Take 10 mg by mouth daily.    . traZODone (DESYREL) 100 MG tablet Take 100 mg by mouth at bedtime.     XIGDUO XR 04-999 MG TB24 TAKE ONE (1) TABLET EACH DAY 90 tablet 1  . HYDROcodone-acetaminophen (NORCO/VICODIN) 5-325 MG tablet Take 1 tablet by mouth every 6 (six) hours as needed for moderate pain.     No facility-administered medications prior to visit.    No Known Allergies  ROS Review of Systems  Musculoskeletal: Positive for back pain.  Psychiatric/Behavioral: The patient is not nervous/anxious.   All other systems reviewed and are negative.     Objective:    Physical Exam Vitals reviewed.  Constitutional:      Appearance: Normal appearance.  HENT:     Head:  Normocephalic.     Nose: Nose normal.  Eyes:     Conjunctiva/sclera: Conjunctivae normal.  Cardiovascular:     Rate and Rhythm: Normal rate and regular rhythm.     Pulses: Normal pulses.     Heart sounds: Normal heart sounds.  Pulmonary:     Effort: Pulmonary effort is normal.     Breath sounds: Normal breath sounds.  Abdominal:     General: Bowel sounds are normal.  Musculoskeletal:        General: Tenderness present.  Skin:    General: Skin is warm.  Neurological:     Mental Status: She is alert and  oriented to person, place, and time.  Psychiatric:        Mood and Affect: Mood normal.        Behavior: Behavior normal.     BP (!) 131/91   Pulse 89   Temp (!) 97.5 F (36.4 C)   Ht 5\' 4"  (1.626 m)   Wt 171 lb (77.6 kg)   LMP  (LMP Unknown)   SpO2 98%   BMI 29.35 kg/m  Wt Readings from Last 3 Encounters:  07/25/20 171 lb (77.6 kg)  06/27/20 165 lb (74.8 kg)  05/16/20 170 lb (77.1 kg)     Health Maintenance Due  Topic Date Due  . Hepatitis C Screening  Never done  . PNEUMOCOCCAL POLYSACCHARIDE VACCINE AGE 69-64 HIGH RISK  Never done  . OPHTHALMOLOGY EXAM  Never done  . PAP SMEAR-Modifier  06/17/2015     No results found for: TSH Lab Results  Component Value Date   WBC 5.2 05/16/2020   HGB 13.2 05/16/2020   HCT 40.3 05/16/2020   MCV 90.6 05/16/2020   PLT 231 05/16/2020   Lab Results  Component Value Date   NA 141 05/16/2020   K 3.4 (L) 05/16/2020   CO2 29 05/16/2020   GLUCOSE 106 (H) 05/16/2020   BUN 8 05/16/2020   CREATININE 0.82 05/16/2020   BILITOT 0.5 03/23/2020   ALKPHOS 77 03/23/2020   AST 15 03/23/2020   ALT 20 03/23/2020   PROT 7.0 03/23/2020   ALBUMIN 4.5 03/23/2020   CALCIUM 9.6 05/16/2020   ANIONGAP 10 05/16/2020   Lab Results  Component Value Date   CHOL 131 11/18/2019   Lab Results  Component Value Date   HDL 44 11/18/2019   Lab Results  Component Value Date   LDLCALC 73 11/18/2019   Lab Results  Component Value Date    TRIG 67 11/18/2019   Lab Results  Component Value Date   CHOLHDL 3.0 11/18/2019   Lab Results  Component Value Date   HGBA1C 6.3 (H) 05/16/2020      Assessment & Plan:   Problem List Items Addressed This Visit      Cardiovascular and Mediastinum   HTN (hypertension)    Patient hypertension managed with lisinopril 20 mg tablet by mouth daily.  No changes necessary.  Continue on  diabetic diet and exercise regimen as tolerated.          Other   Hyperlipemia   Relevant Orders   Lipid Panel   Back pain    Back pain controlled with hydrocodone 5-325 mg tablet every 6 hours as needed for moderate pain.  Rx refill sent to pharmacy.  Provided education to patient with printed handouts given.  Follow-up with worsening or unresolved symptoms.      Relevant Medications   HYDROcodone-acetaminophen (NORCO/VICODIN) 5-325 MG tablet   Chronic right shoulder pain - Primary   Relevant Medications   HYDROcodone-acetaminophen (NORCO/VICODIN) 5-325 MG tablet        Follow-up: Return in about 3 months (around 10/23/2020), or if symptoms worsen or fail to improve.    10/25/2020, NP

## 2020-07-25 NOTE — Assessment & Plan Note (Signed)
Patient hypertension managed with lisinopril 20 mg tablet by mouth daily.  No changes necessary.  Continue on  diabetic diet and exercise regimen as tolerated.

## 2020-07-25 NOTE — Patient Instructions (Signed)
Acute Back Pain, Adult Acute back pain is sudden and usually short-lived. It is often caused by an injury to the muscles and tissues in the back. The injury may result from:  A muscle or ligament getting overstretched or torn (strained). Ligaments are tissues that connect bones to each other. Lifting something improperly can cause a back strain.  Wear and tear (degeneration) of the spinal disks. Spinal disks are circular tissue that provides cushioning between the bones of the spine (vertebrae).  Twisting motions, such as while playing sports or doing yard work.  A hit to the back.  Arthritis. You may have a physical exam, lab tests, and imaging tests to find the cause of your pain. Acute back pain usually goes away with rest and home care. Follow these instructions at home: Managing pain, stiffness, and swelling  Take over-the-counter and prescription medicines only as told by your health care provider.  Your health care provider may recommend applying ice during the first 24-48 hours after your pain starts. To do this: ? Put ice in a plastic bag. ? Place a towel between your skin and the bag. ? Leave the ice on for 20 minutes, 2-3 times a day.  If directed, apply heat to the affected area as often as told by your health care provider. Use the heat source that your health care provider recommends, such as a moist heat pack or a heating pad. ? Place a towel between your skin and the heat source. ? Leave the heat on for 20-30 minutes. ? Remove the heat if your skin turns bright red. This is especially important if you are unable to feel pain, heat, or cold. You have a greater risk of getting burned. Activity   Do not stay in bed. Staying in bed for more than 1-2 days can delay your recovery.  Sit up and stand up straight. Avoid leaning forward when you sit, or hunching over when you stand. ? If you work at a desk, sit close to it so you do not need to lean over. Keep your chin tucked  in. Keep your neck drawn back, and keep your elbows bent at a right angle. Your arms should look like the letter "L." ? Sit high and close to the steering wheel when you drive. Add lower back (lumbar) support to your car seat, if needed.  Take short walks on even surfaces as soon as you are able. Try to increase the length of time you walk each day.  Do not sit, drive, or stand in one place for more than 30 minutes at a time. Sitting or standing for long periods of time can put stress on your back.  Do not drive or use heavy machinery while taking prescription pain medicine.  Use proper lifting techniques. When you bend and lift, use positions that put less stress on your back: ? Bend your knees. ? Keep the load close to your body. ? Avoid twisting.  Exercise regularly as told by your health care provider. Exercising helps your back heal faster and helps prevent back injuries by keeping muscles strong and flexible.  Work with a physical therapist to make a safe exercise program, as recommended by your health care provider. Do any exercises as told by your physical therapist. Lifestyle  Maintain a healthy weight. Extra weight puts stress on your back and makes it difficult to have good posture.  Avoid activities or situations that make you feel anxious or stressed. Stress and anxiety increase muscle   tension and can make back pain worse. Learn ways to manage anxiety and stress, such as through exercise. General instructions  Sleep on a firm mattress in a comfortable position. Try lying on your side with your knees slightly bent. If you lie on your back, put a pillow under your knees.  Follow your treatment plan as told by your health care provider. This may include: ? Cognitive or behavioral therapy. ? Acupuncture or massage therapy. ? Meditation or yoga. Contact a health care provider if:  You have pain that is not relieved with rest or medicine.  You have increasing pain going down  into your legs or buttocks.  Your pain does not improve after 2 weeks.  You have pain at night.  You lose weight without trying.  You have a fever or chills. Get help right away if:  You develop new bowel or bladder control problems.  You have unusual weakness or numbness in your arms or legs.  You develop nausea or vomiting.  You develop abdominal pain.  You feel faint. Summary  Acute back pain is sudden and usually short-lived.  Use proper lifting techniques. When you bend and lift, use positions that put less stress on your back.  Take over-the-counter and prescription medicines and apply heat or ice as directed by your health care provider. This information is not intended to replace advice given to you by your health care provider. Make sure you discuss any questions you have with your health care provider. Document Revised: 11/03/2018 Document Reviewed: 02/26/2017 Elsevier Patient Education  2020 [employer]  

## 2020-07-25 NOTE — Assessment & Plan Note (Signed)
Back pain controlled with hydrocodone 5-325 mg tablet every 6 hours as needed for moderate pain.  Rx refill sent to pharmacy.  Provided education to patient with printed handouts given.  Follow-up with worsening or unresolved symptoms.

## 2020-07-25 NOTE — Addendum Note (Signed)
Addended by: Diamantina Monks on: 07/25/2020 03:44 PM   Modules accepted: Orders

## 2020-07-26 ENCOUNTER — Other Ambulatory Visit: Payer: Self-pay | Admitting: Family Medicine

## 2020-07-26 LAB — LIPID PANEL
Chol/HDL Ratio: 2.7 ratio (ref 0.0–4.4)
Cholesterol, Total: 135 mg/dL (ref 100–199)
HDL: 50 mg/dL (ref >39)
LDL Chol Calc (NIH): 70 mg/dL (ref 0–99)
Triglycerides: 75 mg/dL (ref 0–149)
VLDL Cholesterol Cal: 15 mg/dL (ref 5–40)

## 2020-08-08 ENCOUNTER — Other Ambulatory Visit: Payer: Self-pay

## 2020-08-08 ENCOUNTER — Encounter: Payer: Self-pay | Admitting: Orthopedic Surgery

## 2020-08-08 ENCOUNTER — Ambulatory Visit (INDEPENDENT_AMBULATORY_CARE_PROVIDER_SITE_OTHER): Payer: Managed Care, Other (non HMO) | Admitting: Orthopedic Surgery

## 2020-08-08 VITALS — Ht 64.0 in | Wt 174.0 lb

## 2020-08-08 DIAGNOSIS — Z9889 Other specified postprocedural states: Secondary | ICD-10-CM

## 2020-08-08 NOTE — Progress Notes (Signed)
Orthopaedic Postop Note  Assessment: Tammy Mayo is a 55 y.o. female s/p right shoulder arthroscopy, rotator cuff repair and biceps tenotomy  DOS: 05/18/2020  Plan: Continues to do well.  ROM is almost 100% of contralateral side.  As she continues to strengthen her RUE, she should gain more ROM.  Reminded her she can continue to have improvements up to 6 months postop.  Continue with rehab as per the protocol.  Follow up in 3 months, approximately 6 months postop.   Follow-up: Return in about 3 months (around 11/06/2020). XR at next visit: None  Subjective:  Chief Complaint  Patient presents with  . Shoulder Problem    DOI 05/18/20/doing much better.    History of Present Illness: Tammy Mayo is a 55 y.o. female who presents following the above stated procedure. She is doing well.  Pleased with her progress to date.  She is only taking pain for her lower back, nothing specifically for her shoulder.  She reports some pain with ADLs, but this is manageable.  Continues to work with PT. No numbness or tingling distally.   Review of Systems: No fevers or chills No numbness or tingling No Chest Pain No shortness of breath   Objective: Physical Exam:  Incisions are healing well, no surrounding erythema or drainage  Active forward flexion to 150 degrees, IR to T12, 45 ER at side. No TTP about the shoulder   Not quite able to get to 90/90 position. Strength is 4+/5 compared to contralateral side.   IMAGING: I personally ordered and reviewed the following images:  No new imaging today  Oliver Barre, MD 08/08/2020 11:32 AM

## 2020-08-21 ENCOUNTER — Other Ambulatory Visit: Payer: Self-pay | Admitting: Family Medicine

## 2020-08-23 ENCOUNTER — Other Ambulatory Visit: Payer: Self-pay | Admitting: Family Medicine

## 2020-09-07 ENCOUNTER — Other Ambulatory Visit: Payer: Self-pay | Admitting: Family Medicine

## 2020-09-20 ENCOUNTER — Other Ambulatory Visit: Payer: Self-pay | Admitting: Family Medicine

## 2020-09-20 LAB — HM DIABETES EYE EXAM

## 2020-10-19 ENCOUNTER — Other Ambulatory Visit: Payer: Self-pay | Admitting: Family Medicine

## 2020-10-24 ENCOUNTER — Encounter: Payer: Self-pay | Admitting: Family Medicine

## 2020-10-24 ENCOUNTER — Other Ambulatory Visit: Payer: Self-pay

## 2020-10-24 ENCOUNTER — Ambulatory Visit: Payer: Managed Care, Other (non HMO) | Admitting: Family Medicine

## 2020-10-24 VITALS — BP 136/93 | HR 84 | Temp 97.8°F | Ht 64.0 in | Wt 163.2 lb

## 2020-10-24 DIAGNOSIS — I10 Essential (primary) hypertension: Secondary | ICD-10-CM | POA: Diagnosis not present

## 2020-10-24 DIAGNOSIS — E1140 Type 2 diabetes mellitus with diabetic neuropathy, unspecified: Secondary | ICD-10-CM

## 2020-10-24 DIAGNOSIS — E782 Mixed hyperlipidemia: Secondary | ICD-10-CM

## 2020-10-24 DIAGNOSIS — E559 Vitamin D deficiency, unspecified: Secondary | ICD-10-CM

## 2020-10-24 DIAGNOSIS — M545 Low back pain, unspecified: Secondary | ICD-10-CM

## 2020-10-24 DIAGNOSIS — G8929 Other chronic pain: Secondary | ICD-10-CM | POA: Insufficient documentation

## 2020-10-24 DIAGNOSIS — G629 Polyneuropathy, unspecified: Secondary | ICD-10-CM | POA: Diagnosis not present

## 2020-10-24 DIAGNOSIS — Z79899 Other long term (current) drug therapy: Secondary | ICD-10-CM | POA: Insufficient documentation

## 2020-10-24 DIAGNOSIS — Z794 Long term (current) use of insulin: Secondary | ICD-10-CM

## 2020-10-24 DIAGNOSIS — R5383 Other fatigue: Secondary | ICD-10-CM

## 2020-10-24 DIAGNOSIS — M25511 Pain in right shoulder: Secondary | ICD-10-CM

## 2020-10-24 LAB — BAYER DCA HB A1C WAIVED: HB A1C (BAYER DCA - WAIVED): 6.4 % (ref <7.0)

## 2020-10-24 MED ORDER — LISINOPRIL 20 MG PO TABS: ORAL_TABLET | ORAL | 1 refills | Status: DC

## 2020-10-24 MED ORDER — GABAPENTIN 300 MG PO CAPS: 300.0000 mg | ORAL_CAPSULE | Freq: Two times a day (BID) | ORAL | 1 refills | Status: DC

## 2020-10-24 MED ORDER — XIGDUO XR 10-1000 MG PO TB24: ORAL_TABLET | ORAL | 1 refills | Status: DC

## 2020-10-24 MED ORDER — HYDROCODONE-ACETAMINOPHEN 5-325 MG PO TABS: 1.0000 | ORAL_TABLET | Freq: Four times a day (QID) | ORAL | 0 refills | Status: DC | PRN

## 2020-10-24 MED ORDER — OMEPRAZOLE 20 MG PO CPDR: DELAYED_RELEASE_CAPSULE | ORAL | 1 refills | Status: DC

## 2020-10-24 MED ORDER — HYDROCODONE-ACETAMINOPHEN 5-325 MG PO TABS: 1.0000 | ORAL_TABLET | Freq: Every day | ORAL | 0 refills | Status: DC | PRN

## 2020-10-24 NOTE — Progress Notes (Signed)
Assessment & Plan:  1. Primary hypertension Diastolic mildly elevated today. Patient to monitor. - CBC with Differential/Platelet - CMP14+EGFR  2. Mixed hyperlipidemia Well controlled on current regimen.  - Lipid panel  3. Type 2 diabetes mellitus with diabetic neuropathy, with long-term current use of insulin (HCC) Lab Results  Component Value Date   HGBA1C 6.4 10/24/2020   HGBA1C 6.3 (H) 05/16/2020   HGBA1C 6.3 03/23/2020    - Diabetes is at goal of A1c < 7. - Medications: continue current medications - Home glucose monitoring: continue monitoring - Patient is currently taking a statin. Patient is taking an ACE-inhibitor/ARB.   Diabetes Health Maintenance Due  Topic Date Due  . OPHTHALMOLOGY EXAM  Never done  . FOOT EXAM  03/23/2021  . HEMOGLOBIN A1C  04/26/2021    Lab Results  Component Value Date   LABMICR 15.8 11/18/2019   - Bayer DCA Hb A1c Waived  4. Vitamin D deficiency Vitamin D level added to labs.  5-7. Chronic bilateral low back pain without sciatica/Chronic right shoulder pain/Controlled substance agreement signed Well controlled on current regimen. PDMP reviewed with no concerning findings. Controlled substance agreement in place. Urine drug screen up to date and as expected. Plan to update UDS and CSA at next visit.  - HYDROcodone-acetaminophen (NORCO/VICODIN) 5-325 MG tablet; Take 1 tablet by mouth every 6 (six) hours as needed for moderate pain.  Dispense: 30 tablet; Refill: 0  8. Fatigue, unspecified type Vitamin D level, Vitamin B12 level, and TSH added to today's lab work.   Return for annual physical w. pap smear.  Hendricks Limes, MSN, APRN, FNP-C [name]  Subjective:    Patient ID: Tammy Mayo, female    DOB: 1965/11/13, 55 y.o.   MRN: [id number]  Patient Care Team: Loman Brooklyn, FNP as PCP - General (Family Medicine) Celestia Khat, [place] (Optometry)   Chief Complaint:  Chief Complaint  Patient  presents with  . Diabetes    3 month check-up. Pt states she feels tired and sluggish. She thinks it may one of her medications.    HPI: Tammy Mayo is a 55 y.o. female presenting on 10/24/2020 for Diabetes (3 month check-up. Pt states she feels tired and sluggish. She thinks it may one of her medications.)  Diabetes: Patient presents for follow up of diabetes. Current symptoms include: hyperglycemia. Known diabetic complications: peripheral neuropathy. Medication compliance: yes. Current diet: in general, an "unhealthy" diet. Current exercise: none. Home blood sugar records: BGs have been labile ranging between 83 and 225. Most of her readings are <130. Is she  on ACE inhibitor or angiotensin II receptor blocker? Yes. Is she on a statin? Yes.   Pain assessment: Cause of pain- lumbar fusion at L4-L5 with screws and a spacer Pain location- low back Pain on scale of 1-10- 5/10 with medication; 9/10 without medication Frequency- daily What increases pain- house chores and bending What makes pain better- Biofreeze, rest, and pain medication Effects on ADL - makes difficult Any change in general medical condition- no  Current opioids rx- Norco 5/325 - patient is only taking as needed which is about twice weekly # meds rx- 30 Effectiveness of current meds- good Adverse reactions from pain meds- none Morphine equivalent- 5 MME/day  Pill count performed-No Last drug screen - 11/18/2019 ( high risk q61m moderate risk q667mlow risk yearly ) Urine drug screen today- No [place]- Yes  If yes were their any concerning findings? - No  Overdose risk: 220  Opioid Risk  12/17/2019  Alcohol 3  Illegal Drugs 0  Rx Drugs 0  Alcohol 0  Illegal Drugs 0  Rx Drugs 0  Age between 16-45 years  0  History of Preadolescent Sexual Abuse 0  Psychological Disease 0  Depression 1  Opioid Risk Tool Scoring 4  Opioid Risk Interpretation Moderate Risk   Pain contract signed on:  12/21/2019   New complaints: Patient reports feeling tired and sluggish for the past couple of months.  Social history:  Relevant past medical, surgical, family and social history reviewed and updated as indicated. Interim medical history since our last visit reviewed.  Allergies and medications reviewed and updated.  DATA REVIEWED: CHART IN EPIC  ROS: Negative unless specifically indicated above in HPI.    Current Outpatient Medications:  .  ARIPiprazole ER (ABILIFY MAINTENA) 300 MG PRSY prefilled syringe, Inject 300 mg into the muscle every 28 (twenty-eight) days. , Disp: , Rfl:  .  Biotin 10000 MCG TABS, Take 10,000 mcg by mouth daily. , Disp: , Rfl:  .  cetirizine (ZYRTEC) 10 MG tablet, Take 10 mg by mouth daily., Disp: , Rfl:  .  cholecalciferol (VITAMIN D) 1000 UNITS tablet, Take 1,000 Units by mouth daily., Disp: , Rfl:  .  docusate sodium (COLACE) 100 MG capsule, Take 100 mg by mouth daily., Disp: , Rfl:  .  DULoxetine (CYMBALTA) 30 MG capsule, Take 30 mg by mouth 2 (two) times daily., Disp: , Rfl:  .  gabapentin (NEURONTIN) 300 MG capsule, TAKE ONE CAPSULE BY MOUTH TWICE A DAY, Disp: 60 capsule, Rfl: 2 .  HYDROcodone-acetaminophen (NORCO/VICODIN) 5-325 MG tablet, Take 1 tablet by mouth every 6 (six) hours as needed for moderate pain., Disp: 30 tablet, Rfl: 0 .  lisinopril (ZESTRIL) 20 MG tablet, TAKE ONE (1) TABLET EACH DAY, Disp: 90 tablet, Rfl: 0 .  omeprazole (PRILOSEC) 20 MG capsule, TAKE ONE (1) CAPSULE EACH DAY, Disp: 90 capsule, Rfl: 0 .  ondansetron (ZOFRAN) 4 MG tablet, Take 1 tablet (4 mg total) by mouth every 8 (eight) hours as needed for nausea or vomiting., Disp: 10 tablet, Rfl: 1 .  oxymetazoline (AFRIN) 0.05 % nasal spray, Place 1 spray into both nostrils 2 (two) times daily as needed for congestion (over the counter nasal spray)., Disp: , Rfl:  .  OZEMPIC, 1 MG/DOSE, 4 MG/3ML SOPN, INJECT 1MG SQ WEEKLY, Disp: 3 mL, Rfl: 0 .  polyethylene glycol (MIRALAX /  GLYCOLAX) 17 g packet, Take 17 g by mouth daily. (Patient taking differently: Take 17 g by mouth daily as needed for moderate constipation.), Disp: 14 each, Rfl: 5 .  rosuvastatin (CRESTOR) 10 MG tablet, Take 10 mg by mouth daily., Disp: , Rfl:  .  traZODone (DESYREL) 100 MG tablet, Take 100 mg by mouth at bedtime. , Disp: , Rfl:  .  XIGDUO XR 04-999 MG TB24, TAKE ONE (1) TABLET EACH DAY, Disp: 90 tablet, Rfl: 1   No Known Allergies Past Medical History:  Diagnosis Date  . Anemia   . Arthritis   . Chronic back pain    lumbar 4-5 slip and radicular right leg pain  . COVID-19 07/27/2019  . Depression   . Diabetes mellitus without complication ([place])   . GERD (gastroesophageal reflux disease)   . Hyperlipidemia   . Hypertension   . Seasonal allergies   . Vitamin D deficiency   . Wears glasses     Past Surgical History:  Procedure Laterality Date  .  ARTHOSCOPIC ROTAOR CUFF REPAIR Right 05/18/2020   Procedure: ARTHROSCOPIC ROTATOR CUFF REPAIR;  Surgeon: Mordecai Rasmussen, MD;  Location: [facility];  Service: Orthopedics;  Laterality: Right;  . BACK SURGERY  05/2015   lumbar fusion  . BICEPT TENODESIS Right 05/18/2020   Procedure: BICEPS TENOTOMY;  Surgeon: Mordecai Rasmussen, MD;  Location: [facility];  Service: Orthopedics;  Laterality: Right;  . CESAREAN SECTION     x 1  . CHOLECYSTECTOMY N/A 11/13/2015   Procedure: LAPAROSCOPIC CHOLECYSTECTOMY;  Surgeon: Rolm Bookbinder, MD;  Location: [place];  Service: General;  Laterality: N/A;  . DILATION AND CURETTAGE OF UTERUS    . HERNIA REPAIR     umbilical hernia repair as a child  . TUBAL LIGATION      Social History   Socioeconomic History  . Marital status: Married    Spouse name: Not on file  . Number of children: Not on file  . Years of education: Not on file  . Highest education level: Not on file  Occupational History  . Not on file  Tobacco Use  . Smoking status: Never Smoker  . Smokeless tobacco: Never Used  Vaping Use  . Vaping  Use: Never used  Substance and Sexual Activity  . Alcohol use: No  . Drug use: No  . Sexual activity: Yes    Birth control/protection: None  Other Topics Concern  . Not on file  Social History Narrative  . Not on file   Social Determinants of Health   Financial Resource Strain: Not on file  Food Insecurity: Not on file  Transportation Needs: Not on file  Physical Activity: Not on file  Stress: Not on file  Social Connections: Not on file  Intimate Partner Violence: Not on file        Objective:    BP (!) 136/93   Pulse 84   Temp 97.8 F (36.6 C) (Temporal)   Ht '5\' 4"'  (1.626 m)   Wt 163 lb 3.2 oz (74 kg)   LMP  (LMP Unknown)   SpO2 99%   BMI 28.01 kg/m   Wt Readings from Last 3 Encounters:  10/24/20 163 lb 3.2 oz (74 kg)  08/08/20 174 lb (78.9 kg)  07/25/20 171 lb (77.6 kg)    Physical Exam Vitals reviewed.  Constitutional:      General: She is not in acute distress.    Appearance: Normal appearance. She is overweight. She is not ill-appearing, toxic-appearing or diaphoretic.  HENT:     Head: Normocephalic and atraumatic.  Eyes:     General: No scleral icterus.       Right eye: No discharge.        Left eye: No discharge.     Conjunctiva/sclera: Conjunctivae normal.  Cardiovascular:     Rate and Rhythm: Normal rate and regular rhythm.     Heart sounds: Normal heart sounds. No murmur heard. No friction rub. No gallop.   Pulmonary:     Effort: Pulmonary effort is normal. No respiratory distress.     Breath sounds: Normal breath sounds. No stridor. No wheezing, rhonchi or rales.  Musculoskeletal:        General: Normal range of motion.     Cervical back: Normal range of motion.  Skin:    General: Skin is warm and dry.     Capillary Refill: Capillary refill takes less than 2 seconds.  Neurological:     General: No focal deficit present.     Mental Status:  She is alert and oriented to person, place, and time. Mental status is at baseline.  Psychiatric:         Mood and Affect: Mood normal.        Behavior: Behavior normal.        Thought Content: Thought content normal.        Judgment: Judgment normal.     No results found for: TSH Lab Results  Component Value Date   WBC 5.2 05/16/2020   HGB 13.2 05/16/2020   HCT 40.3 05/16/2020   MCV 90.6 05/16/2020   PLT 231 05/16/2020   Lab Results  Component Value Date   NA 141 05/16/2020   K 3.4 (L) 05/16/2020   CO2 29 05/16/2020   GLUCOSE 106 (H) 05/16/2020   BUN 8 05/16/2020   CREATININE 0.82 05/16/2020   BILITOT 0.5 03/23/2020   ALKPHOS 77 03/23/2020   AST 15 03/23/2020   ALT 20 03/23/2020   PROT 7.0 03/23/2020   ALBUMIN 4.5 03/23/2020   CALCIUM 9.6 05/16/2020   ANIONGAP 10 05/16/2020   Lab Results  Component Value Date   CHOL 135 07/25/2020   Lab Results  Component Value Date   HDL 50 07/25/2020   Lab Results  Component Value Date   LDLCALC 70 07/25/2020   Lab Results  Component Value Date   TRIG 75 07/25/2020   Lab Results  Component Value Date   CHOLHDL 2.7 07/25/2020   Lab Results  Component Value Date   HGBA1C 6.3 (H) 05/16/2020

## 2020-10-25 LAB — CBC WITH DIFFERENTIAL/PLATELET
Basophils Absolute: 0 10*3/uL (ref 0.0–0.2)
Basos: 1 %
EOS (ABSOLUTE): 0.1 10*3/uL (ref 0.0–0.4)
Eos: 1 %
Hematocrit: 39.8 % (ref 34.0–46.6)
Hemoglobin: 12.7 g/dL (ref 11.1–15.9)
Immature Grans (Abs): 0 10*3/uL (ref 0.0–0.1)
Immature Granulocytes: 0 %
Lymphocytes Absolute: 2.6 10*3/uL (ref 0.7–3.1)
Lymphs: 41 %
MCH: 27.5 pg (ref 26.6–33.0)
MCHC: 31.9 g/dL (ref 31.5–35.7)
MCV: 86 fL (ref 79–97)
Monocytes Absolute: 0.5 10*3/uL (ref 0.1–0.9)
Monocytes: 8 %
Neutrophils Absolute: 3.1 10*3/uL (ref 1.4–7.0)
Neutrophils: 49 %
Platelets: 355 10*3/uL (ref 150–450)
RBC: 4.61 x10E6/uL (ref 3.77–5.28)
RDW: 13 % (ref 11.7–15.4)
WBC: 6.3 10*3/uL (ref 3.4–10.8)

## 2020-10-25 LAB — CMP14+EGFR
ALT: 20 IU/L (ref 0–32)
AST: 19 IU/L (ref 0–40)
Albumin/Globulin Ratio: 1.6 (ref 1.2–2.2)
Albumin: 4.3 g/dL (ref 3.8–4.9)
Alkaline Phosphatase: 92 IU/L (ref 44–121)
BUN/Creatinine Ratio: 8 — ABNORMAL LOW (ref 9–23)
BUN: 7 mg/dL (ref 6–24)
Bilirubin Total: 0.4 mg/dL (ref 0.0–1.2)
CO2: 26 mmol/L (ref 20–29)
Calcium: 9.6 mg/dL (ref 8.7–10.2)
Chloride: 104 mmol/L (ref 96–106)
Creatinine, Ser: 0.84 mg/dL (ref 0.57–1.00)
Globulin, Total: 2.7 g/dL (ref 1.5–4.5)
Glucose: 85 mg/dL (ref 65–99)
Potassium: 3.6 mmol/L (ref 3.5–5.2)
Sodium: 146 mmol/L — ABNORMAL HIGH (ref 134–144)
Total Protein: 7 g/dL (ref 6.0–8.5)
eGFR: 83 mL/min/{1.73_m2} (ref >59)

## 2020-10-25 LAB — LIPID PANEL
Chol/HDL Ratio: 2.8 ratio (ref 0.0–4.4)
Cholesterol, Total: 119 mg/dL (ref 100–199)
HDL: 42 mg/dL (ref >39)
LDL Chol Calc (NIH): 61 mg/dL (ref 0–99)
Triglycerides: 77 mg/dL (ref 0–149)
VLDL Cholesterol Cal: 16 mg/dL (ref 5–40)

## 2020-10-26 LAB — VITAMIN D 25 HYDROXY (VIT D DEFICIENCY, FRACTURES): Vit D, 25-Hydroxy: 37.6 ng/mL (ref 30.0–100.0)

## 2020-10-26 LAB — TSH: TSH: 1.13 u[IU]/mL (ref 0.450–4.500)

## 2020-10-26 LAB — VITAMIN B12: Vitamin B-12: 525 pg/mL (ref 232–1245)

## 2020-10-26 LAB — SPECIMEN STATUS REPORT

## 2020-11-07 ENCOUNTER — Other Ambulatory Visit: Payer: Self-pay

## 2020-11-07 ENCOUNTER — Ambulatory Visit: Payer: Managed Care, Other (non HMO) | Admitting: Orthopedic Surgery

## 2020-11-07 ENCOUNTER — Encounter: Payer: Self-pay | Admitting: Orthopedic Surgery

## 2020-11-07 VITALS — BP 135/81 | HR 84 | Ht 64.0 in | Wt 164.0 lb

## 2020-11-07 DIAGNOSIS — M75121 Complete rotator cuff tear or rupture of right shoulder, not specified as traumatic: Secondary | ICD-10-CM

## 2020-11-07 DIAGNOSIS — Z9889 Other specified postprocedural states: Secondary | ICD-10-CM

## 2020-11-07 NOTE — Progress Notes (Signed)
Orthopaedic Postop Note  Assessment: Tammy Mayo is a 55 y.o. female s/p right shoulder arthroscopy, rotator cuff repair and biceps tenotomy  DOS: 05/18/2020  Plan: She is now 6 months out from surgery on her right shoulder, and she is doing very well.  She has no pain.  Her range of motion and strength have completely recovered.  She is very pleased with her improvements following surgery.  She has no restrictions at this time, and she can return to all of her previous activities.  No further follow-up is needed, but we are available in clinic if she has any issues.  Follow-up: Return if symptoms worsen or fail to improve. XR at next visit: None  Subjective:  Chief Complaint  Patient presents with  . Routine Post Op    S/P Rt shoulder arthroscopy DOS 05/18/20    History of Present Illness: Tammy Mayo is a 55 y.o. female who presents following the above stated procedure.  She has recovered very well.  She is not having any pain.  She feels as though her strength is improved.  She has no complaints at this time.  She is happy with her recovery, and pleased that she had the surgery.  No issues with her surgical incisions.  She denies numbness and tingling.  No other complaints at this time.  Review of Systems: No fevers or chills No numbness or tingling No Chest Pain No shortness of breath   Objective: Physical Exam:  Incisions are healing well, no surrounding erythema or drainage  Active forward flexion to 170 degrees, IR to T12, 45 ER at side. No TTP about the shoulder   Easily gets to the 90/90 position Strength is 5/5 in deltoids, biceps, triceps, wrist extension as well as with supraspinatus and infraspinatus testing.  Negative belly press. Sensation is intact throughout the right upper extremity.  Fingers are warm and well-perfused.  IMAGING: I personally ordered and reviewed the following images:  No new imaging today  Oliver Barre,  MD 11/07/2020 11:26 AM

## 2020-11-22 ENCOUNTER — Ambulatory Visit (INDEPENDENT_AMBULATORY_CARE_PROVIDER_SITE_OTHER): Payer: Managed Care, Other (non HMO) | Admitting: Family Medicine

## 2020-11-22 ENCOUNTER — Other Ambulatory Visit: Payer: Self-pay

## 2020-11-22 ENCOUNTER — Encounter: Payer: Self-pay | Admitting: Family Medicine

## 2020-11-22 ENCOUNTER — Other Ambulatory Visit: Payer: Self-pay | Admitting: Family Medicine

## 2020-11-22 VITALS — BP 136/84 | HR 84 | Temp 98.0°F | Ht 64.0 in | Wt 162.0 lb

## 2020-11-22 DIAGNOSIS — R300 Dysuria: Secondary | ICD-10-CM

## 2020-11-22 LAB — MICROSCOPIC EXAMINATION: RBC, Urine: NONE SEEN /hpf (ref 0–2)

## 2020-11-22 LAB — URINALYSIS, ROUTINE W REFLEX MICROSCOPIC
Bilirubin, UA: NEGATIVE
Ketones, UA: NEGATIVE
Nitrite, UA: NEGATIVE
Protein,UA: NEGATIVE
Specific Gravity, UA: 1.015 (ref 1.005–1.030)
Urobilinogen, Ur: 2 mg/dL — ABNORMAL HIGH (ref 0.2–1.0)
pH, UA: 6 (ref 5.0–7.5)

## 2020-11-22 MED ORDER — NITROFURANTOIN MONOHYD MACRO 100 MG PO CAPS: 100.0000 mg | ORAL_CAPSULE | Freq: Two times a day (BID) | ORAL | 0 refills | Status: AC

## 2020-11-22 NOTE — Patient Instructions (Signed)
Urinary Tract Infection, Adult  A urinary tract infection (UTI) is an infection of any part of the urinary tract. The urinary tract includes the kidneys, ureters, bladder, and urethra. These organs make, store, and get rid of urine in the body. An upper UTI affects the ureters and kidneys. A lower UTI affects the bladder and urethra. What are the causes? Most urinary tract infections are caused by bacteria in your genital area around your urethra, where urine leaves your body. These bacteria grow and cause inflammation of your urinary tract. What increases the risk? You are more likely to develop this condition if:  You have a urinary catheter that stays in place.  You are not able to control when you urinate or have a bowel movement (incontinence).  You are female and you: ? Use a spermicide or diaphragm for birth control. ? Have low estrogen levels. ? Are pregnant.  You have certain genes that increase your risk.  You are sexually active.  You take antibiotic medicines.  You have a condition that causes your flow of urine to slow down, such as: ? An enlarged prostate, if you are female. ? Blockage in your urethra. ? A kidney stone. ? A nerve condition that affects your bladder control (neurogenic bladder). ? Not getting enough to drink, or not urinating often.  You have certain medical conditions, such as: ? Diabetes. ? A weak disease-fighting system (immunesystem). ? Sickle cell disease. ? Gout. ? Spinal cord injury. What are the signs or symptoms? Symptoms of this condition include:  Needing to urinate right away (urgency).  Frequent urination. This may include small amounts of urine each time you urinate.  Pain or burning with urination.  Blood in the urine.  Urine that smells bad or unusual.  Trouble urinating.  Cloudy urine.  Vaginal discharge, if you are female.  Pain in the abdomen or the lower back. You may also have:  Vomiting or a decreased  appetite.  Confusion.  Irritability or tiredness.  A fever or chills.  Diarrhea. The first symptom in older adults may be confusion. In some cases, they may not have any symptoms until the infection has worsened. How is this diagnosed? This condition is diagnosed based on your medical history and a physical exam. You may also have other tests, including:  Urine tests.  Blood tests.  Tests for STIs (sexually transmitted infections). If you have had more than one UTI, a cystoscopy or imaging studies may be done to determine the cause of the infections. How is this treated? Treatment for this condition includes:  Antibiotic medicine.  Over-the-counter medicines to treat discomfort.  Drinking enough water to stay hydrated. If you have frequent infections or have other conditions such as a kidney stone, you may need to see a health care provider who specializes in the urinary tract (urologist). In rare cases, urinary tract infections can cause sepsis. Sepsis is a life-threatening condition that occurs when the body responds to an infection. Sepsis is treated in the hospital with IV antibiotics, fluids, and other medicines. Follow these instructions at home: Medicines  Take over-the-counter and prescription medicines only as told by your health care provider.  If you were prescribed an antibiotic medicine, take it as told by your health care provider. Do not stop using the antibiotic even if you start to feel better. General instructions  Make sure you: ? Empty your bladder often and completely. Do not hold urine for long periods of time. ? Empty your bladder after   sex. ? Wipe from front to back after urinating or having a bowel movement if you are female. Use each tissue only one time when you wipe.  Drink enough fluid to keep your urine pale yellow.  Keep all follow-up visits. This is important.   Contact a health care provider if:  Your symptoms do not get better after 1-2  days.  Your symptoms go away and then return. Get help right away if:  You have severe pain in your back or your lower abdomen.  You have a fever or chills.  You have nausea or vomiting. Summary  A urinary tract infection (UTI) is an infection of any part of the urinary tract, which includes the kidneys, ureters, bladder, and urethra.  Most urinary tract infections are caused by bacteria in your genital area.  Treatment for this condition often includes antibiotic medicines.  If you were prescribed an antibiotic medicine, take it as told by your health care provider. Do not stop using the antibiotic even if you start to feel better.  Keep all follow-up visits. This is important. This information is not intended to replace advice given to you by your health care provider. Make sure you discuss any questions you have with your health care provider. Document Revised: 02/25/2020 Document Reviewed: 02/25/2020 Elsevier Patient Education  2021 [employer]  

## 2020-11-22 NOTE — Progress Notes (Signed)
Acute Office Visit  Subjective:    Patient ID: Tammy Mayo, female    DOB: [birth year], 55 y.o.   MRN: [id number]  Chief Complaint  Patient presents with  . Dysuria    HPI Patient is in today for dysuria x 1 week. She has had some abdominal pressure as well. Also reports frequency. Denies nausea, vomiting, fever, flank pain, or urgency. She denies vaginal itching or discharge. She took some AZO a few days ago with some improvement. She has had UTIs in the past and these are typical symptoms for her.   Past Medical History:  Diagnosis Date  . Anemia   . Arthritis   . Chronic back pain    lumbar 4-5 slip and radicular right leg pain  . COVID-19 07/27/2019  . Depression   . Diabetes mellitus without complication ([place])   . GERD (gastroesophageal reflux disease)   . Hyperlipidemia   . Hypertension   . Neuropathy   . Seasonal allergies   . Vitamin D deficiency   . Wears glasses     Past Surgical History:  Procedure Laterality Date  . ARTHOSCOPIC ROTAOR CUFF REPAIR Right 05/18/2020   Procedure: ARTHROSCOPIC ROTATOR CUFF REPAIR;  Surgeon: Mordecai Rasmussen, MD;  Location: [facility];  Service: Orthopedics;  Laterality: Right;  . BACK SURGERY  05/2015   lumbar fusion  . BICEPT TENODESIS Right 05/18/2020   Procedure: BICEPS TENOTOMY;  Surgeon: Mordecai Rasmussen, MD;  Location: [facility];  Service: Orthopedics;  Laterality: Right;  . CESAREAN SECTION     x 1  . CHOLECYSTECTOMY N/A 11/13/2015   Procedure: LAPAROSCOPIC CHOLECYSTECTOMY;  Surgeon: Rolm Bookbinder, MD;  Location: [facility];  Service: General;  Laterality: N/A;  . DILATION AND CURETTAGE OF UTERUS    . HERNIA REPAIR     umbilical hernia repair as a child  . TUBAL LIGATION      Family History  Problem Relation Age of Onset  . Stroke Mother   . Hypertension Mother   . Diabetes Mother   . Hypertension Father   . Diabetes Father   . Kidney disease Father   . Hyperlipidemia Father   . Hypertension Sister   .  Hyperlipidemia Sister   . Hypertension Brother   . Hypertension Brother   . Gout Brother   . Hypertension Sister     Social History   Socioeconomic History  . Marital status: Married    Spouse name: Not on file  . Number of children: Not on file  . Years of education: Not on file  . Highest education level: Not on file  Occupational History  . Not on file  Tobacco Use  . Smoking status: Never Smoker  . Smokeless tobacco: Never Used  Vaping Use  . Vaping Use: Never used  Substance and Sexual Activity  . Alcohol use: No  . Drug use: No  . Sexual activity: Yes    Birth control/protection: None  Other Topics Concern  . Not on file  Social History Narrative  . Not on file   Social Determinants of Health   Financial Resource Strain: Not on file  Food Insecurity: Not on file  Transportation Needs: Not on file  Physical Activity: Not on file  Stress: Not on file  Social Connections: Not on file  Intimate Partner Violence: Not on file    Outpatient Medications Prior to Visit  Medication Sig Dispense Refill  . ARIPiprazole ER (ABILIFY MAINTENA) 300 MG PRSY prefilled syringe Inject 300  mg into the muscle every 28 (twenty-eight) days.     . Biotin 10000 MCG TABS Take 10,000 mcg by mouth daily.     . cetirizine (ZYRTEC) 10 MG tablet Take 10 mg by mouth daily.    . cholecalciferol (VITAMIN D) 1000 UNITS tablet Take 1,000 Units by mouth daily.    . Dapagliflozin-metFORMIN HCl ER (XIGDUO XR) 04-999 MG TB24 TAKE ONE (1) TABLET EACH DAY 90 tablet 1  . docusate sodium (COLACE) 100 MG capsule Take 100 mg by mouth daily.    . DULoxetine (CYMBALTA) 30 MG capsule Take 30 mg by mouth 2 (two) times daily.    Marland Kitchen gabapentin (NEURONTIN) 300 MG capsule Take 1 capsule (300 mg total) by mouth 2 (two) times daily. 180 capsule 1  . HYDROcodone-acetaminophen (NORCO/VICODIN) 5-325 MG tablet Take 1 tablet by mouth daily as needed for moderate pain. 30 tablet 0  . lisinopril (ZESTRIL) 20 MG tablet  TAKE ONE (1) TABLET EACH DAY 90 tablet 1  . omeprazole (PRILOSEC) 20 MG capsule TAKE ONE (1) CAPSULE EACH DAY 90 capsule 1  . oxymetazoline (AFRIN) 0.05 % nasal spray Place 1 spray into both nostrils 2 (two) times daily as needed for congestion (over the counter nasal spray).    Marland Kitchen OZEMPIC, 1 MG/DOSE, 4 MG/3ML SOPN INJECT 1MG SQ WEEKLY 3 mL 0  . polyethylene glycol (MIRALAX / GLYCOLAX) 17 g packet Take 17 g by mouth daily. (Patient taking differently: Take 17 g by mouth daily as needed for moderate constipation.) 14 each 5  . rosuvastatin (CRESTOR) 10 MG tablet Take 10 mg by mouth daily.    . traZODone (DESYREL) 100 MG tablet Take 100 mg by mouth at bedtime.      No facility-administered medications prior to visit.    No Known Allergies  Review of Systems As per HPI.     Objective:    Physical Exam Vitals and nursing note reviewed.  Constitutional:      General: She is not in acute distress.    Appearance: She is not ill-appearing, toxic-appearing or diaphoretic.  Cardiovascular:     Rate and Rhythm: Normal rate and regular rhythm.     Heart sounds: Normal heart sounds. No murmur heard.   Pulmonary:     Effort: Pulmonary effort is normal. No respiratory distress.     Breath sounds: Normal breath sounds.  Abdominal:     General: Bowel sounds are normal. There is no distension.     Palpations: Abdomen is soft.     Tenderness: There is no abdominal tenderness. There is no right CVA tenderness, left CVA tenderness, guarding or rebound.  Skin:    General: Skin is warm and dry.  Neurological:     Mental Status: She is alert.  Psychiatric:        Mood and Affect: Mood normal.        Behavior: Behavior normal.     BP 136/84   Pulse 84   Temp 98 F (36.7 C) (Temporal)   Ht '5\' 4"'  (1.626 m)   Wt 162 lb (73.5 kg)   LMP  (LMP Unknown)   BMI 27.81 kg/m  Wt Readings from Last 3 Encounters:  11/22/20 162 lb (73.5 kg)  11/07/20 164 lb (74.4 kg)  10/24/20 163 lb 3.2 oz (74 kg)    Urine dipstick shows positive for glucose, leuks.  Micro exam: 6-10 WBC's per HPF and few + bacteria.   Health Maintenance Due  Topic Date Due  . HIV  Screening  Never done  . PAP SMEAR-Modifier  06/17/2015    There are no preventive care reminders to display for this patient.   Lab Results  Component Value Date   TSH 1.130 10/24/2020   Lab Results  Component Value Date   WBC 6.3 10/24/2020   HGB 12.7 10/24/2020   HCT 39.8 10/24/2020   MCV 86 10/24/2020   PLT 355 10/24/2020   Lab Results  Component Value Date   NA 146 (H) 10/24/2020   K 3.6 10/24/2020   CO2 26 10/24/2020   GLUCOSE 85 10/24/2020   BUN 7 10/24/2020   CREATININE 0.84 10/24/2020   BILITOT 0.4 10/24/2020   ALKPHOS 92 10/24/2020   AST 19 10/24/2020   ALT 20 10/24/2020   PROT 7.0 10/24/2020   ALBUMIN 4.3 10/24/2020   CALCIUM 9.6 10/24/2020   ANIONGAP 10 05/16/2020   EGFR 83 10/24/2020   Lab Results  Component Value Date   CHOL 119 10/24/2020   Lab Results  Component Value Date   HDL 42 10/24/2020   Lab Results  Component Value Date   LDLCALC 61 10/24/2020   Lab Results  Component Value Date   TRIG 77 10/24/2020   Lab Results  Component Value Date   CHOLHDL 2.8 10/24/2020   Lab Results  Component Value Date   HGBA1C 6.4 10/24/2020       Assessment & Plan:   Tammy Mayo was seen today for dysuria.  Diagnoses and all orders for this visit:  Dysuria Increased leuks and few bacteria. Reports history of UTIs with same symptoms. Culture pending. Start of macrobid as below, will discontinue if negative culture. Return to office for new or worsening symptoms, or if symptoms persist.  -     Urinalysis, Routine w reflex microscopic -     Urine Culture -     nitrofurantoin, macrocrystal-monohydrate, (MACROBID) 100 MG capsule; Take 1 capsule (100 mg total) by mouth 2 (two) times daily for 5 days. 1 po BId  The patient indicates understanding of these issues and agrees with the  plan.  Gwenlyn Perking, FNP

## 2020-11-26 LAB — URINE CULTURE

## 2020-12-21 ENCOUNTER — Other Ambulatory Visit: Payer: Self-pay | Admitting: Family Medicine

## 2021-01-25 ENCOUNTER — Encounter: Payer: Self-pay | Admitting: Family Medicine

## 2021-01-25 ENCOUNTER — Other Ambulatory Visit (HOSPITAL_COMMUNITY)
Admission: RE | Admit: 2021-01-25 | Discharge: 2021-01-25 | Disposition: A | Discharge status: Home / Self Care | Payer: Managed Care, Other (non HMO) | Source: Ambulatory Visit | Attending: Family Medicine | Admitting: Family Medicine

## 2021-01-25 ENCOUNTER — Other Ambulatory Visit: Payer: Self-pay

## 2021-01-25 ENCOUNTER — Ambulatory Visit (INDEPENDENT_AMBULATORY_CARE_PROVIDER_SITE_OTHER): Payer: Managed Care, Other (non HMO) | Admitting: Family Medicine

## 2021-01-25 VITALS — BP 125/88 | HR 87 | Temp 98.1°F | Ht 64.0 in | Wt 173.4 lb

## 2021-01-25 DIAGNOSIS — Z1151 Encounter for screening for human papillomavirus (HPV): Secondary | ICD-10-CM | POA: Insufficient documentation

## 2021-01-25 DIAGNOSIS — Z113 Encounter for screening for infections with a predominantly sexual mode of transmission: Secondary | ICD-10-CM | POA: Insufficient documentation

## 2021-01-25 DIAGNOSIS — F331 Major depressive disorder, recurrent, moderate: Secondary | ICD-10-CM

## 2021-01-25 DIAGNOSIS — M545 Low back pain, unspecified: Secondary | ICD-10-CM

## 2021-01-25 DIAGNOSIS — Z124 Encounter for screening for malignant neoplasm of cervix: Secondary | ICD-10-CM | POA: Insufficient documentation

## 2021-01-25 DIAGNOSIS — G8929 Other chronic pain: Secondary | ICD-10-CM

## 2021-01-25 DIAGNOSIS — Z79899 Other long term (current) drug therapy: Secondary | ICD-10-CM

## 2021-01-25 DIAGNOSIS — Z0001 Encounter for general adult medical examination with abnormal findings: Secondary | ICD-10-CM | POA: Diagnosis not present

## 2021-01-25 DIAGNOSIS — I10 Essential (primary) hypertension: Secondary | ICD-10-CM

## 2021-01-25 DIAGNOSIS — Z23 Encounter for immunization: Secondary | ICD-10-CM

## 2021-01-25 DIAGNOSIS — Z0000 Encounter for general adult medical examination without abnormal findings: Secondary | ICD-10-CM

## 2021-01-25 DIAGNOSIS — E1140 Type 2 diabetes mellitus with diabetic neuropathy, unspecified: Secondary | ICD-10-CM

## 2021-01-25 DIAGNOSIS — F419 Anxiety disorder, unspecified: Secondary | ICD-10-CM

## 2021-01-25 DIAGNOSIS — Z794 Long term (current) use of insulin: Secondary | ICD-10-CM

## 2021-01-25 LAB — BAYER DCA HB A1C WAIVED: HB A1C (BAYER DCA - WAIVED): 5.8 % (ref <7.0)

## 2021-01-25 MED ORDER — HYDROCODONE-ACETAMINOPHEN 5-325 MG PO TABS: 1.0000 | ORAL_TABLET | Freq: Every day | ORAL | 0 refills | Status: DC | PRN

## 2021-01-25 NOTE — Patient Instructions (Signed)
Preventive Care 68-54 Years Old, Female Preventive care refers to lifestyle choices and visits with your health care provider that can promote health and wellness. This includes: A yearly physical exam. This is also called an annual wellness visit. Regular dental and eye exams. Immunizations. Screening for certain conditions. Healthy lifestyle choices, such as: Eating a healthy diet. Getting regular exercise. Not using drugs or products that contain nicotine and tobacco. Limiting alcohol use. What can I expect for my preventive care visit? Physical exam Your health care provider will check your: Height and weight. These may be used to calculate your BMI (body mass index). BMI is a measurement that tells if you are at a healthy weight. Heart rate and blood pressure. Body temperature. Skin for abnormal spots. Counseling Your health care provider may ask you questions about your: Past medical problems. Family's medical history. Alcohol, tobacco, and drug use. Emotional well-being. Home life and relationship well-being. Sexual activity. Diet, exercise, and sleep habits. Work and work Statistician. Access to firearms. Method of birth control. Menstrual cycle. Pregnancy history. What immunizations do I need?  Vaccines are usually given at various ages, according to a schedule. Your health care provider will recommend vaccines for you based on your age, medicalhistory, and lifestyle or other factors, such as travel or where you work. What tests do I need? Blood tests Lipid and cholesterol levels. These may be checked every 5 years, or more often if you are over 37 years old. Hepatitis C test. Hepatitis B test. Screening Lung cancer screening. You may have this screening every year starting at age 30 if you have a 30-pack-year history of smoking and currently smoke or have quit within the past 15 years. Colorectal cancer screening. All adults should have this screening starting at  age 23 and continuing until age 3. Your health care provider may recommend screening at age 88 if you are at increased risk. You will have tests every 1-10 years, depending on your results and the type of screening test. Diabetes screening. This is done by checking your blood sugar (glucose) after you have not eaten for a while (fasting). You may have this done every 1-3 years. Mammogram. This may be done every 1-2 years. Talk with your health care provider about when you should start having regular mammograms. This may depend on whether you have a family history of breast cancer. BRCA-related cancer screening. This may be done if you have a family history of breast, ovarian, tubal, or peritoneal cancers. Pelvic exam and Pap test. This may be done every 3 years starting at age 79. Starting at age 54, this may be done every 5 years if you have a Pap test in combination with an HPV test. Other tests STD (sexually transmitted disease) testing, if you are at risk. Bone density scan. This is done to screen for osteoporosis. You may have this scan if you are at high risk for osteoporosis. Talk with your health care provider about your test results, treatment options,and if necessary, the need for more tests. Follow these instructions at home: Eating and drinking  Eat a diet that includes fresh fruits and vegetables, whole grains, lean protein, and low-fat dairy products. Take vitamin and mineral supplements as recommended by your health care provider. Do not drink alcohol if: Your health care provider tells you not to drink. You are pregnant, may be pregnant, or are planning to become pregnant. If you drink alcohol: Limit how much you have to 0-1 drink a day. Be aware  of how much alcohol is in your drink. In the [place] one drink equals one 12 oz bottle of beer (355 mL), one 5 oz glass of wine (148 mL), or one 1 oz glass of hard liquor (44 mL).  Lifestyle Take daily care of your teeth and  gums. Brush your teeth every morning and night with fluoride toothpaste. Floss one time each day. Stay active. Exercise for at least 30 minutes 5 or more days each week. Do not use any products that contain nicotine or tobacco, such as cigarettes, e-cigarettes, and chewing tobacco. If you need help quitting, ask your health care provider. Do not use drugs. If you are sexually active, practice safe sex. Use a condom or other form of protection to prevent STIs (sexually transmitted infections). If you do not wish to become pregnant, use a form of birth control. If you plan to become pregnant, see your health care provider for a prepregnancy visit. If told by your health care provider, take low-dose aspirin daily starting at age 29. Find healthy ways to cope with stress, such as: Meditation, yoga, or listening to music. Journaling. Talking to a trusted person. Spending time with friends and family. Safety Always wear your seat belt while driving or riding in a vehicle. Do not drive: If you have been drinking alcohol. Do not ride with someone who has been drinking. When you are tired or distracted. While texting. Wear a helmet and other protective equipment during sports activities. If you have firearms in your house, make sure you follow all gun safety procedures. What's next? Visit your health care provider once a year for an annual wellness visit. Ask your health care provider how often you should have your eyes and teeth checked. Stay up to date on all vaccines. This information is not intended to replace advice given to you by your health care provider. Make sure you discuss any questions you have with your healthcare provider. Document Revised: 04/18/2020 Document Reviewed: 03/26/2018 Elsevier Patient Education  2022 [employer].

## 2021-01-25 NOTE — Progress Notes (Signed)
Assessment & Plan:  1. Well adult exam Preventive health education provided. Patient declined HIV and hepatitis C screening. She will get Shingrix at the pharmamcy.  - CMP14+EGFR  2. Screening for cervical cancer - Cytology - PAP  3. Routine screening for STI (sexually transmitted infection) - Cytology - PAP  4. Screening for human papillomavirus (HPV) - Cytology - PAP  5. Type 2 diabetes mellitus with diabetic neuropathy, with long-term current use of insulin (HCC) Lab Results  Component Value Date   HGBA1C 5.8 01/25/2021   HGBA1C 6.4 10/24/2020   HGBA1C 6.3 (H) 05/16/2020    - Diabetes is at goal of A1c < 7. - Medications: continue current medications - Patient is currently taking a statin. Patient is taking an ACE-inhibitor/ARB.   Diabetes Health Maintenance Due  Topic Date Due   FOOT EXAM  03/23/2021   HEMOGLOBIN A1C  07/27/2021   OPHTHALMOLOGY EXAM  09/20/2021    - CMP14+EGFR - Bayer DCA Hb A1c Waived  6. Essential hypertension Well controlled on current regimen.   7. Chronic bilateral low back pain without sciatica Well controlled on current regimen. Controlled substance agreement updated today. Patient left a urine for UDS (I forgot to place the order before it was thrown out). We will obtain next visit. PDMP reviewed with no concerning findings. - HYDROcodone-acetaminophen (NORCO/VICODIN) 5-325 MG tablet; Take 1 tablet by mouth daily as needed for moderate pain.  Dispense: 30 tablet; Refill: 0 - CMP14+EGFR  8. Controlled substance agreement signed - HYDROcodone-acetaminophen (NORCO/VICODIN) 5-325 MG tablet; Take 1 tablet by mouth daily as needed for moderate pain.  Dispense: 30 tablet; Refill: 0  9. Recurrent moderate major depressive disorder with anxiety [facility]) Managed by psychiatry.  10. Immunization due - SHINGRIX injection; Inject 0.5 mLs into the muscle once for 1 dose.  Dispense: 0.5 mL; Refill: 0 - advised to get at the pharmacy.    Follow-up:  Return in about 3 months (around 04/27/2021) for follow-up of chronic medication conditions.   Hendricks Limes, MSN, APRN, FNP-C [facility]  Subjective:  Patient ID: Tammy Mayo, female    DOB: 03-Apr-1966  Age: 55 y.o. MRN: [id number]  Patient Care Team: Loman Brooklyn, FNP as PCP - General (Family Medicine) Celestia Khat, OD (Optometry)   CC:  Chief Complaint  Patient presents with   Gynecologic Exam    HPI Tammy Mayo presents for her annual physical.  Occupation: Unemployed, Marital status: Married, Substance use: None Diet: None, Exercise: None Last eye exam: February 2022 Last dental exam: Goes every 6 months Last colonoscopy: 05/02/2016 Last mammogram: 04/04/2020 Last pap smear: 06/16/2012 Hepatitis C Screening: Declined Immunizations: Flu Vaccine:  not flu season Tdap Vaccine: up to date  Shingrix Vaccine: at the pharmacy for administration, so it can be run through insurance first   COVID-19 Vaccine: up to date  Diabetes: Patient presents for follow up of diabetes. Current symptoms include: hyperglycemia. Known diabetic complications: peripheral neuropathy. Medication compliance: yes. Current diet: in general, an "unhealthy" diet. Current exercise: none. Is she  on ACE inhibitor or angiotensin II receptor blocker? Yes. Is she on a statin? Yes.   Pain assessment: Cause of pain- lumbar fusion at L4-L5 with screws and a spacer Pain location- low back Pain on scale of 1-10- 5/10 with medication; 9/10 without medication Frequency- daily What increases pain- house chores and bending What makes pain better- Biofreeze, rest, and pain medication Effects on ADL - makes difficult Any change in general medical  condition- no  Current opioids rx- Norco 5/325 - patient is only taking as needed which is about twice weekly # meds rx- 30 Effectiveness of current meds- good Adverse reactions from pain meds- none Morphine equivalent- 5  MME/day  Pill count performed-No Last drug screen - 11/18/2019 ( high risk q38m moderate risk q637mlow risk yearly ) Urine drug screen today- Yes Was the [place]- Yes  If yes were their any concerning findings? - No  Overdose risk: 100  Opioid Risk  12/17/2019  Alcohol 3  Illegal Drugs 0  Rx Drugs 0  Alcohol 0  Illegal Drugs 0  Rx Drugs 0  Age between 16-45 years  0  History of Preadolescent Sexual Abuse 0  Psychological Disease 0  Depression 1  Opioid Risk Tool Scoring 4  Opioid Risk Interpretation Moderate Risk   Pain contract signed on: updated today (01/25/2021)  Depression: patient is seeing a psychiatrist and doing counseling. She had an appointment last week. She states they are adjusting her medications and trying to help.  [facility] 01/25/2021 10/24/2020 07/25/2020  Decreased Interest '2 2 2  ' Down, Depressed, Hopeless '2 2 2  ' PHQ - 2 Score '4 4 4  ' Altered sleeping 0 1 1  Tired, decreased energy '3 3 3  ' Change in appetite 2 2 0  Feeling bad or failure about yourself  '3 2 1  ' Trouble concentrating 2 1 0  Moving slowly or fidgety/restless '2 1 1  ' Suicidal thoughts '2 2 1  ' PHQ-9 Score '18 16 11  ' Difficult doing work/chores Very difficult Very difficult Not difficult at all   *Thoughts are that she would be better off dead, not of harming herself*  GAD 7 : Generalized Anxiety Score 01/25/2021 10/24/2020 07/25/2020 03/23/2020  Nervous, Anxious, on Edge '2 2 1 2  ' Control/stop worrying '2 1 1 3  ' Worry too much - different things 0 '2 1 2  ' Trouble relaxing '3 1 1 2  ' Restless '1 1 2 2  ' Easily annoyed or irritable '2 2 1 1  ' Afraid - awful might happen '3 3 2 2  ' Total GAD 7 Score '13 12 9 14  ' Anxiety Difficulty Very difficult Very difficult Somewhat difficult -    Review of Systems  Constitutional:  Negative for chills, fever, malaise/fatigue and weight loss.  HENT:  Negative for congestion, ear discharge, ear pain, nosebleeds, sinus pain, sore throat and  tinnitus.   Eyes:  Negative for blurred vision, double vision, pain, discharge and redness.  Respiratory:  Negative for cough, shortness of breath and wheezing.   Cardiovascular:  Negative for chest pain, palpitations and leg swelling.  Gastrointestinal:  Negative for abdominal pain, constipation, diarrhea, heartburn, nausea and vomiting.  Genitourinary:  Negative for dysuria, frequency and urgency.  Musculoskeletal:  Positive for back pain. Negative for myalgias.  Skin:  Negative for rash.  Neurological:  Negative for dizziness, seizures, weakness and headaches.  Psychiatric/Behavioral:  Positive for depression. Negative for substance abuse and suicidal ideas. The patient is nervous/anxious.     Current Outpatient Medications:    ARIPiprazole ER (ABILIFY MAINTENA) 300 MG PRSY prefilled syringe, Inject 300 mg into the muscle every 28 (twenty-eight) days. , Disp: , Rfl:    Biotin 10000 MCG TABS, Take 10,000 mcg by mouth daily. , Disp: , Rfl:    cetirizine (ZYRTEC) 10 MG tablet, Take 10 mg by mouth daily., Disp: , Rfl:    cholecalciferol (VITAMIN D) 1000 UNITS tablet, Take 1,000 Units by mouth  daily., Disp: , Rfl:    Dapagliflozin-metFORMIN HCl ER (XIGDUO XR) 04-999 MG TB24, TAKE ONE (1) TABLET EACH DAY, Disp: 90 tablet, Rfl: 1   docusate sodium (COLACE) 100 MG capsule, Take 100 mg by mouth daily., Disp: , Rfl:    DULoxetine (CYMBALTA) 30 MG capsule, Take 30 mg by mouth 2 (two) times daily., Disp: , Rfl:    gabapentin (NEURONTIN) 300 MG capsule, Take 1 capsule (300 mg total) by mouth 2 (two) times daily., Disp: 180 capsule, Rfl: 1   HYDROcodone-acetaminophen (NORCO/VICODIN) 5-325 MG tablet, Take 1 tablet by mouth daily as needed for moderate pain., Disp: 30 tablet, Rfl: 0   lisinopril (ZESTRIL) 20 MG tablet, TAKE ONE (1) TABLET EACH DAY, Disp: 90 tablet, Rfl: 1   omeprazole (PRILOSEC) 20 MG capsule, TAKE ONE (1) CAPSULE EACH DAY, Disp: 90 capsule, Rfl: 1   oxymetazoline (AFRIN) 0.05 % nasal  spray, Place 1 spray into both nostrils 2 (two) times daily as needed for congestion (over the counter nasal spray)., Disp: , Rfl:    OZEMPIC, 1 MG/DOSE, 4 MG/3ML SOPN, INJECT 1MG SQ WEEKLY, Disp: 3 mL, Rfl: 2   polyethylene glycol (MIRALAX / GLYCOLAX) 17 g packet, Take 17 g by mouth daily. (Patient taking differently: Take 17 g by mouth daily as needed for moderate constipation.), Disp: 14 each, Rfl: 5   rosuvastatin (CRESTOR) 10 MG tablet, Take 10 mg by mouth daily., Disp: , Rfl:    traZODone (DESYREL) 100 MG tablet, Take 100 mg by mouth at bedtime. , Disp: , Rfl:   No Known Allergies  Past Medical History:  Diagnosis Date   Anemia    Arthritis    Chronic back pain    lumbar 4-5 slip and radicular right leg pain   COVID-19 07/27/2019   Depression    Diabetes mellitus without complication (HCC)    GERD (gastroesophageal reflux disease)    Hyperlipidemia    Hypertension    Neuropathy    Seasonal allergies    Vitamin D deficiency    Wears glasses     Past Surgical History:  Procedure Laterality Date   ARTHOSCOPIC ROTAOR CUFF REPAIR Right 05/18/2020   Procedure: ARTHROSCOPIC ROTATOR CUFF REPAIR;  Surgeon: Mordecai Rasmussen, MD;  Location: [facility];  Service: Orthopedics;  Laterality: Right;   BACK SURGERY  05/2015   lumbar fusion   BICEPT TENODESIS Right 05/18/2020   Procedure: BICEPS TENOTOMY;  Surgeon: Mordecai Rasmussen, MD;  Location: [facility];  Service: Orthopedics;  Laterality: Right;   CESAREAN SECTION     x 1   CHOLECYSTECTOMY N/A 11/13/2015   Procedure: LAPAROSCOPIC CHOLECYSTECTOMY;  Surgeon: Rolm Bookbinder, MD;  Location: [place];  Service: General;  Laterality: N/A;   DILATION AND CURETTAGE OF UTERUS     HERNIA REPAIR     umbilical hernia repair as a child   TUBAL LIGATION      Family History  Problem Relation Age of Onset   Stroke Mother    Hypertension Mother    Diabetes Mother    Hypertension Father    Diabetes Father    Kidney disease Father    Hyperlipidemia  Father    Hypertension Sister    Hyperlipidemia Sister    Hypertension Brother    Hypertension Brother    Gout Brother    Hypertension Sister     Social History   Socioeconomic History   Marital status: Married    Spouse name: Not on file   Number of children:  Not on file   Years of education: Not on file   Highest education level: Not on file  Occupational History   Not on file  Tobacco Use   Smoking status: Never   Smokeless tobacco: Never  Vaping Use   Vaping Use: Never used  Substance and Sexual Activity   Alcohol use: No   Drug use: No   Sexual activity: Yes    Birth control/protection: None  Other Topics Concern   Not on file  Social History Narrative   Not on file   Social Determinants of Health   Financial Resource Strain: Not on file  Food Insecurity: Not on file  Transportation Needs: Not on file  Physical Activity: Not on file  Stress: Not on file  Social Connections: Not on file  Intimate Partner Violence: Not on file      Objective:    BP 125/88   Pulse 87   Temp 98.1 F (36.7 C) (Temporal)   Ht '5\' 4"'  (1.626 m)   Wt 173 lb 6.4 oz (78.7 kg)   LMP  (LMP Unknown)   SpO2 96%   BMI 29.76 kg/m   Wt Readings from Last 3 Encounters:  01/25/21 173 lb 6.4 oz (78.7 kg)  11/22/20 162 lb (73.5 kg)  11/07/20 164 lb (74.4 kg)    Physical Exam Vitals reviewed. Exam conducted with a chaperone present.  Constitutional:      General: She is not in acute distress.    Appearance: Normal appearance. She is overweight. She is not ill-appearing, toxic-appearing or diaphoretic.  HENT:     Head: Normocephalic and atraumatic.     Right Ear: Tympanic membrane, ear canal and external ear normal. There is no impacted cerumen.     Left Ear: Tympanic membrane, ear canal and external ear normal. There is no impacted cerumen.     Nose: Nose normal. No congestion or rhinorrhea.     Mouth/Throat:     Mouth: Mucous membranes are moist.     Pharynx: Oropharynx is  clear. No oropharyngeal exudate or posterior oropharyngeal erythema.  Eyes:     General: No scleral icterus.       Right eye: No discharge.        Left eye: No discharge.     Conjunctiva/sclera: Conjunctivae normal.     Pupils: Pupils are equal, round, and reactive to light.  Cardiovascular:     Rate and Rhythm: Normal rate and regular rhythm.     Heart sounds: Normal heart sounds. No murmur heard.   No friction rub. No gallop.  Pulmonary:     Effort: Pulmonary effort is normal. No respiratory distress.     Breath sounds: Normal breath sounds. No stridor. No wheezing, rhonchi or rales.  Abdominal:     General: Abdomen is flat. Bowel sounds are normal. There is no distension.     Palpations: Abdomen is soft. There is no mass.     Tenderness: There is no abdominal tenderness. There is no guarding or rebound.     Hernia: No hernia is present.  Genitourinary:    Pubic Area: No rash or pubic lice.      Labia:        Right: No rash, tenderness, lesion or injury.        Left: No rash, tenderness, lesion or injury.      Vagina: No foreign body. Vaginal discharge present. No erythema, tenderness, bleeding or lesions.     Cervix: Normal.  Uterus: Normal.      Adnexa: Right adnexa normal and left adnexa normal.  Musculoskeletal:        General: Normal range of motion.     Cervical back: Normal range of motion and neck supple. No rigidity. No muscular tenderness.  Lymphadenopathy:     Cervical: No cervical adenopathy.  Skin:    General: Skin is warm and dry.     Capillary Refill: Capillary refill takes less than 2 seconds.  Neurological:     General: No focal deficit present.     Mental Status: She is alert and oriented to person, place, and time. Mental status is at baseline.  Psychiatric:        Mood and Affect: Mood normal.        Behavior: Behavior normal.        Thought Content: Thought content normal.        Judgment: Judgment normal.    Lab Results  Component Value Date    TSH 1.130 10/24/2020   Lab Results  Component Value Date   WBC 6.3 10/24/2020   HGB 12.7 10/24/2020   HCT 39.8 10/24/2020   MCV 86 10/24/2020   PLT 355 10/24/2020   Lab Results  Component Value Date   NA 146 (H) 10/24/2020   K 3.6 10/24/2020   CO2 26 10/24/2020   GLUCOSE 85 10/24/2020   BUN 7 10/24/2020   CREATININE 0.84 10/24/2020   BILITOT 0.4 10/24/2020   ALKPHOS 92 10/24/2020   AST 19 10/24/2020   ALT 20 10/24/2020   PROT 7.0 10/24/2020   ALBUMIN 4.3 10/24/2020   CALCIUM 9.6 10/24/2020   ANIONGAP 10 05/16/2020   EGFR 83 10/24/2020   Lab Results  Component Value Date   CHOL 119 10/24/2020   Lab Results  Component Value Date   HDL 42 10/24/2020   Lab Results  Component Value Date   LDLCALC 61 10/24/2020   Lab Results  Component Value Date   TRIG 77 10/24/2020   Lab Results  Component Value Date   CHOLHDL 2.8 10/24/2020   Lab Results  Component Value Date   HGBA1C 6.4 10/24/2020

## 2021-01-26 LAB — CMP14+EGFR
ALT: 17 IU/L (ref 0–32)
AST: 17 IU/L (ref 0–40)
Albumin/Globulin Ratio: 1.9 (ref 1.2–2.2)
Albumin: 4.5 g/dL (ref 3.8–4.9)
Alkaline Phosphatase: 86 IU/L (ref 44–121)
BUN/Creatinine Ratio: 12 (ref 9–23)
BUN: 10 mg/dL (ref 6–24)
Bilirubin Total: 0.4 mg/dL (ref 0.0–1.2)
CO2: 22 mmol/L (ref 20–29)
Calcium: 9.9 mg/dL (ref 8.7–10.2)
Chloride: 103 mmol/L (ref 96–106)
Creatinine, Ser: 0.85 mg/dL (ref 0.57–1.00)
Globulin, Total: 2.4 g/dL (ref 1.5–4.5)
Glucose: 72 mg/dL (ref 65–99)
Potassium: 4 mmol/L (ref 3.5–5.2)
Sodium: 145 mmol/L — ABNORMAL HIGH (ref 134–144)
Total Protein: 6.9 g/dL (ref 6.0–8.5)
eGFR: 81 mL/min/{1.73_m2} (ref >59)

## 2021-01-28 DIAGNOSIS — F331 Major depressive disorder, recurrent, moderate: Secondary | ICD-10-CM | POA: Insufficient documentation

## 2021-01-28 MED ORDER — SHINGRIX 50 MCG/0.5ML IM SUSR: 0.5000 mL | Freq: Once | INTRAMUSCULAR | 0 refills | Status: AC

## 2021-01-31 LAB — CYTOLOGY - PAP
Chlamydia: NEGATIVE
Comment: NEGATIVE
Comment: NEGATIVE
Comment: NEGATIVE
Comment: NORMAL
Diagnosis: NEGATIVE
High risk HPV: NEGATIVE
Neisseria Gonorrhea: NEGATIVE
Trichomonas: NEGATIVE

## 2021-03-01 ENCOUNTER — Other Ambulatory Visit: Payer: Self-pay | Admitting: Family Medicine

## 2021-03-19 ENCOUNTER — Other Ambulatory Visit: Payer: Self-pay | Admitting: Family Medicine

## 2021-04-12 ENCOUNTER — Other Ambulatory Visit: Payer: Self-pay | Admitting: Family Medicine

## 2021-04-19 ENCOUNTER — Other Ambulatory Visit: Payer: Self-pay | Admitting: Family Medicine

## 2021-04-27 ENCOUNTER — Ambulatory Visit: Payer: Managed Care, Other (non HMO) | Admitting: Family Medicine

## 2021-05-03 ENCOUNTER — Other Ambulatory Visit: Payer: Self-pay | Admitting: Family Medicine

## 2021-05-11 ENCOUNTER — Ambulatory Visit: Payer: Managed Care, Other (non HMO) | Admitting: Family Medicine

## 2021-05-11 ENCOUNTER — Encounter: Payer: Self-pay | Admitting: Family Medicine

## 2021-05-11 ENCOUNTER — Other Ambulatory Visit: Payer: Self-pay | Admitting: Family Medicine

## 2021-05-11 ENCOUNTER — Other Ambulatory Visit: Payer: Self-pay

## 2021-05-11 VITALS — BP 113/81 | HR 90 | Temp 97.8°F | Ht 64.0 in | Wt 175.0 lb

## 2021-05-11 DIAGNOSIS — E782 Mixed hyperlipidemia: Secondary | ICD-10-CM | POA: Diagnosis not present

## 2021-05-11 DIAGNOSIS — Z79899 Other long term (current) drug therapy: Secondary | ICD-10-CM

## 2021-05-11 DIAGNOSIS — E1140 Type 2 diabetes mellitus with diabetic neuropathy, unspecified: Secondary | ICD-10-CM

## 2021-05-11 DIAGNOSIS — G629 Polyneuropathy, unspecified: Secondary | ICD-10-CM

## 2021-05-11 DIAGNOSIS — M545 Low back pain, unspecified: Secondary | ICD-10-CM

## 2021-05-11 DIAGNOSIS — Z23 Encounter for immunization: Secondary | ICD-10-CM

## 2021-05-11 DIAGNOSIS — F331 Major depressive disorder, recurrent, moderate: Secondary | ICD-10-CM

## 2021-05-11 DIAGNOSIS — G8929 Other chronic pain: Secondary | ICD-10-CM

## 2021-05-11 DIAGNOSIS — I10 Essential (primary) hypertension: Secondary | ICD-10-CM

## 2021-05-11 DIAGNOSIS — K219 Gastro-esophageal reflux disease without esophagitis: Secondary | ICD-10-CM

## 2021-05-11 DIAGNOSIS — F419 Anxiety disorder, unspecified: Secondary | ICD-10-CM

## 2021-05-11 DIAGNOSIS — Z794 Long term (current) use of insulin: Secondary | ICD-10-CM

## 2021-05-11 LAB — BAYER DCA HB A1C WAIVED: HB A1C (BAYER DCA - WAIVED): 6 % — ABNORMAL HIGH (ref 4.8–5.6)

## 2021-05-11 MED ORDER — LISINOPRIL 20 MG PO TABS: 20.0000 mg | ORAL_TABLET | Freq: Every day | ORAL | 1 refills | Status: DC

## 2021-05-11 MED ORDER — HYDROCODONE-ACETAMINOPHEN 5-325 MG PO TABS: 1.0000 | ORAL_TABLET | Freq: Every day | ORAL | 0 refills | Status: DC | PRN

## 2021-05-11 MED ORDER — OMEPRAZOLE 20 MG PO CPDR: 20.0000 mg | DELAYED_RELEASE_CAPSULE | Freq: Every day | ORAL | 1 refills | Status: DC

## 2021-05-11 MED ORDER — GABAPENTIN 300 MG PO CAPS: 300.0000 mg | ORAL_CAPSULE | Freq: Two times a day (BID) | ORAL | 1 refills | Status: DC

## 2021-05-11 NOTE — Patient Instructions (Signed)
[facility]   Address: [address], [place], [place] [address]  Phone: [phone]  

## 2021-05-11 NOTE — Progress Notes (Signed)
Assessment & Plan:  1. Type 2 diabetes mellitus with diabetic neuropathy, with long-term current use of insulin (HCC) Lab Results  Component Value Date   HGBA1C 6.0 (H) 05/11/2021   HGBA1C 5.8 01/25/2021   HGBA1C 6.4 10/24/2020    - Diabetes is at goal of A1c < 7. - Medications: continue current medications - Home glucose monitoring: continue monitoring - Patient is currently taking a statin. Patient is taking an ACE-inhibitor/ARB.  - Instruction/counseling given: discussed foot care  Diabetes Health Maintenance Due  Topic Date Due   OPHTHALMOLOGY EXAM  09/20/2021   HEMOGLOBIN A1C  11/09/2021   FOOT EXAM  05/11/2022    Lab Results  Component Value Date   LABMICR See below: 11/22/2020   LABMICR 15.8 11/18/2019   - Lipid panel - CBC with Differential/Platelet - CMP14+EGFR - Bayer DCA Hb A1c Waived  2. Essential hypertension Well controlled on current regimen.  - Lipid panel - CBC with Differential/Platelet - CMP14+EGFR - lisinopril (ZESTRIL) 20 MG tablet; Take 1 tablet (20 mg total) by mouth daily.  Dispense: 90 tablet; Refill: 1  3. Mixed hyperlipidemia Well controlled on current regimen.  - Lipid panel - CMP14+EGFR  4. Chronic bilateral low back pain without sciatica Well controlled on current regimen. Controlled substance agreement in place. Urine drug screen collected today. PDMP reviewed with no concerning findings.  - CMP14+EGFR - HYDROcodone-acetaminophen (NORCO/VICODIN) 5-325 MG tablet; Take 1 tablet by mouth daily as needed for moderate pain.  Dispense: 30 tablet; Refill: 0 - gabapentin (NEURONTIN) 300 MG capsule; Take 1 capsule (300 mg total) by mouth 2 (two) times daily.  Dispense: 180 capsule; Refill: 1 - ToxASSURE Select 13 (MW), Urine  5. Neuropathy Well controlled on current regimen.  - CMP14+EGFR - gabapentin (NEURONTIN) 300 MG capsule; Take 1 capsule (300 mg total) by mouth 2 (two) times daily.  Dispense: 180 capsule; Refill: 1  6. Controlled  substance agreement signed - HYDROcodone-acetaminophen (NORCO/VICODIN) 5-325 MG tablet; Take 1 tablet by mouth daily as needed for moderate pain.  Dispense: 30 tablet; Refill: 0 - ToxASSURE Select 13 (MW), Urine  7. Gastroesophageal reflux disease, unspecified whether esophagitis present Well controlled on current regimen.  - CMP14+EGFR - omeprazole (PRILOSEC) 20 MG capsule; Take 1 capsule (20 mg total) by mouth daily.  Dispense: 90 capsule; Refill: 1  8. Recurrent moderate major depressive disorder with anxiety ([place]) Continue counseling and following with psychiatry. Phone # and address provided to mental health hospital open 24 hours in [place]. - CMP14+EGFR  9. Need for immunization against influenza - Flu Vaccine QUAD 41moIM (Fluarix, Fluzone & Alfiuria Quad PF)  10. Immunization due - Varicella-zoster vaccine IM (Shingrix)   Return in about 3 months (around 08/11/2021) for follow-up of chronic medication conditions.  BHendricks Limes MSN, APRN, FNP-C [facility]  Subjective:    Patient ID: Tammy Mayo female    DOB: [id number] 55y.o.   MRN: [id number] Patient Care Team: JLoman Brooklyn FNP as PCP - General (Family Medicine) JCelestia Khat ODe Graff(Optometry)   Chief Complaint:  Chief Complaint  Patient presents with   Diabetes   Hypertension    3 month follow up of chronic medical conditions     HPI: Tammy JANUSZis a 55y.o. female presenting on 05/11/2021 for Diabetes and Hypertension (3 month follow up of chronic medical conditions )  Diabetes: Patient presents for follow up of diabetes. Current symptoms include: hyperglycemia. Known diabetic complications: peripheral neuropathy. Medication compliance: yes.  Current diet: in general, an "unhealthy" diet. Current exercise: none. Home blood sugar records: BGs have been labile ranging between 87 and 187 . Is she  on ACE inhibitor or angiotensin II receptor blocker? Yes. Is she on a  statin? Yes.   Depression: patient is seeing a psychiatrist and doing counseling. They are adjusting her medications which she feels is helping. She does have thoughts that she would be better off dead and of harming herself. She has not tried to harm herself. She does not have a plan to harm herself. She does not think she would and feels if the thoughts did become more real that she knows she could not do it because of her children and grandchildren.   Depression screen [facility] 2/9 05/11/2021 01/25/2021 10/24/2020  Decreased Interest '2 2 2  ' Down, Depressed, Hopeless '2 2 2  ' PHQ - 2 Score '4 4 4  ' Altered sleeping 2 0 1  Tired, decreased energy 0 3 3  Change in appetite '1 2 2  ' Feeling bad or failure about yourself  '3 3 2  ' Trouble concentrating '1 2 1  ' Moving slowly or fidgety/restless '1 2 1  ' Suicidal thoughts '3 2 2  ' PHQ-9 Score '15 18 16  ' Difficult doing work/chores Very difficult Very difficult Very difficult   GAD 7 : Generalized Anxiety Score 05/11/2021 01/25/2021 10/24/2020 07/25/2020  Nervous, Anxious, on Edge '2 2 2 1  ' Control/stop worrying '3 2 1 1  ' Worry too much - different things 2 0 2 1  Trouble relaxing '1 3 1 1  ' Restless '2 1 1 2  ' Easily annoyed or irritable '2 2 2 1  ' Afraid - awful might happen '3 3 3 2  ' Total GAD 7 Score '15 13 12 9  ' Anxiety Difficulty Very difficult Very difficult Very difficult Somewhat difficult    Pain assessment: Cause of pain- lumbar fusion at L4-L5 with screws and a spacer Pain location- low back Pain on scale of 1-10- 5/10 with medication; 9/10 without medication Frequency- daily What increases pain- house chores and bending What makes pain better- Biofreeze, rest, and pain medication Effects on ADL - makes difficult Any change in general medical condition- no  Current opioids rx- Norco 5/325 - patient is only taking as needed which is about twice weekly # meds rx- 30 (which is lasting 3 months at a time) Effectiveness of current meds- good Adverse  reactions from pain meds- none Morphine equivalent- 5 MME/day  Pill count performed-No Last drug screen - 11/18/2019 (patient left sample at our last visit, but I forgot to put in the order) ( high risk q70m moderate risk q676mlow risk yearly ) Urine drug screen today- Yes Was the NCElyriaeviewed- Yes  If yes were their any concerning findings? - No  Overdose risk: 190  Opioid Risk  12/17/2019  Alcohol 3  Illegal Drugs 0  Rx Drugs 0  Alcohol 0  Illegal Drugs 0  Rx Drugs 0  Age between 16-45 years  0  History of Preadolescent Sexual Abuse 0  Psychological Disease 0  Depression 1  Opioid Risk Tool Scoring 4  Opioid Risk Interpretation Moderate Risk   Pain contract signed on: updated on 01/25/2021   New complaints: None   Social history:  Relevant past medical, surgical, family and social history reviewed and updated as indicated. Interim medical history since our last visit reviewed.  Allergies and medications reviewed and updated.  DATA REVIEWED: CHART IN EPIC  ROS: Negative unless specifically indicated above  in HPI.    Current Outpatient Medications:    ARIPiprazole ER (ABILIFY MAINTENA) 300 MG PRSY prefilled syringe, Inject 400 mg into the muscle every 28 (twenty-eight) days., Disp: , Rfl:    Biotin 10000 MCG TABS, Take 10,000 mcg by mouth daily. , Disp: , Rfl:    cetirizine (ZYRTEC) 10 MG tablet, Take 10 mg by mouth daily., Disp: , Rfl:    cholecalciferol (VITAMIN D) 1000 UNITS tablet, Take 1,000 Units by mouth daily., Disp: , Rfl:    docusate sodium (COLACE) 100 MG capsule, Take 100 mg by mouth daily., Disp: , Rfl:    DULoxetine (CYMBALTA) 30 MG capsule, Take 30 mg by mouth 2 (two) times daily., Disp: , Rfl:    gabapentin (NEURONTIN) 300 MG capsule, Take 1 capsule (300 mg total) by mouth 2 (two) times daily., Disp: 180 capsule, Rfl: 1   HYDROcodone-acetaminophen (NORCO/VICODIN) 5-325 MG tablet, Take 1 tablet by mouth daily as needed for moderate pain., Disp: 30  tablet, Rfl: 0   lisinopril (ZESTRIL) 20 MG tablet, TAKE ONE (1) TABLET EACH DAY, Disp: 90 tablet, Rfl: 1   omeprazole (PRILOSEC) 20 MG capsule, TAKE ONE (1) CAPSULE EACH DAY, Disp: 90 capsule, Rfl: 1   oxymetazoline (AFRIN) 0.05 % nasal spray, Place 1 spray into both nostrils 2 (two) times daily as needed for congestion (over the counter nasal spray)., Disp: , Rfl:    OZEMPIC, 1 MG/DOSE, 4 MG/3ML SOPN, INJECT 1MG SQ WEEKLY, Disp: 3 mL, Rfl: 0   polyethylene glycol (MIRALAX / GLYCOLAX) 17 g packet, Take 17 g by mouth daily. (Patient taking differently: Take 17 g by mouth daily as needed for moderate constipation.), Disp: 14 each, Rfl: 5   rosuvastatin (CRESTOR) 10 MG tablet, TAKE ONE (1) TABLET EACH DAY, Disp: 90 tablet, Rfl: 0   traZODone (DESYREL) 100 MG tablet, Take 100 mg by mouth at bedtime. , Disp: , Rfl:    TRUEplus Lancets 30G MISC, 1 Device by Does not apply route 4 (four) times daily as needed., Disp: 100 each, Rfl: 3   XIGDUO XR 04-999 MG TB24, TAKE ONE (1) TABLET EACH DAY, Disp: 90 tablet, Rfl: 0   No Known Allergies Past Medical History:  Diagnosis Date   Anemia    Arthritis    Chronic back pain    lumbar 4-5 slip and radicular right leg pain   COVID-19 07/27/2019   Depression    Diabetes mellitus without complication (HCC)    GERD (gastroesophageal reflux disease)    Hyperlipidemia    Hypertension    Neuropathy    Seasonal allergies    Vitamin D deficiency    Wears glasses     Past Surgical History:  Procedure Laterality Date   ARTHOSCOPIC ROTAOR CUFF REPAIR Right 05/18/2020   Procedure: ARTHROSCOPIC ROTATOR CUFF REPAIR;  Surgeon: Mordecai Rasmussen, MD;  Location: [facility];  Service: Orthopedics;  Laterality: Right;   BACK SURGERY  05/2015   lumbar fusion   BICEPT TENODESIS Right 05/18/2020   Procedure: BICEPS TENOTOMY;  Surgeon: Mordecai Rasmussen, MD;  Location: [facility];  Service: Orthopedics;  Laterality: Right;   CESAREAN SECTION     x 1   CHOLECYSTECTOMY N/A 11/13/2015    Procedure: LAPAROSCOPIC CHOLECYSTECTOMY;  Surgeon: Rolm Bookbinder, MD;  Location: [place];  Service: General;  Laterality: N/A;   DILATION AND CURETTAGE OF UTERUS     HERNIA REPAIR     umbilical hernia repair as a child   TUBAL LIGATION  Social History   Socioeconomic History   Marital status: Married    Spouse name: Not on file   Number of children: Not on file   Years of education: Not on file   Highest education level: Not on file  Occupational History   Not on file  Tobacco Use   Smoking status: Never   Smokeless tobacco: Never  Vaping Use   Vaping Use: Never used  Substance and Sexual Activity   Alcohol use: No   Drug use: No   Sexual activity: Yes    Birth control/protection: None  Other Topics Concern   Not on file  Social History Narrative   Not on file   Social Determinants of Health   Financial Resource Strain: Not on file  Food Insecurity: Not on file  Transportation Needs: Not on file  Physical Activity: Not on file  Stress: Not on file  Social Connections: Not on file  Intimate Partner Violence: Not on file        Objective:    BP 113/81   Pulse 90   Temp 97.8 F (36.6 C) (Temporal)   Ht '5\' 4"'  (1.626 m)   Wt 175 lb (79.4 kg)   LMP  (LMP Unknown)   SpO2 94%   BMI 30.04 kg/m   Wt Readings from Last 3 Encounters:  05/11/21 175 lb (79.4 kg)  01/25/21 173 lb 6.4 oz (78.7 kg)  11/22/20 162 lb (73.5 kg)    Physical Exam Vitals reviewed.  Constitutional:      General: She is not in acute distress.    Appearance: Normal appearance. She is obese. She is not ill-appearing, toxic-appearing or diaphoretic.  HENT:     Head: Normocephalic and atraumatic.  Eyes:     General: No scleral icterus.       Right eye: No discharge.        Left eye: No discharge.     Conjunctiva/sclera: Conjunctivae normal.  Cardiovascular:     Rate and Rhythm: Normal rate and regular rhythm.     Heart sounds: Normal heart sounds. No murmur heard.   No  friction rub. No gallop.  Pulmonary:     Effort: Pulmonary effort is normal. No respiratory distress.     Breath sounds: Normal breath sounds. No stridor. No wheezing, rhonchi or rales.  Musculoskeletal:        General: Normal range of motion.     Cervical back: Normal range of motion.  Skin:    General: Skin is warm and dry.     Capillary Refill: Capillary refill takes less than 2 seconds.  Neurological:     General: No focal deficit present.     Mental Status: She is alert and oriented to person, place, and time. Mental status is at baseline.  Psychiatric:        Mood and Affect: Mood normal.        Behavior: Behavior normal.        Thought Content: Thought content normal.        Judgment: Judgment normal.    Lab Results  Component Value Date   TSH 1.130 10/24/2020   Lab Results  Component Value Date   WBC 6.3 10/24/2020   HGB 12.7 10/24/2020   HCT 39.8 10/24/2020   MCV 86 10/24/2020   PLT 355 10/24/2020   Lab Results  Component Value Date   NA 145 (H) 01/25/2021   K 4.0 01/25/2021   CO2 22 01/25/2021   GLUCOSE 72 01/25/2021  BUN 10 01/25/2021   CREATININE 0.85 01/25/2021   BILITOT 0.4 01/25/2021   ALKPHOS 86 01/25/2021   AST 17 01/25/2021   ALT 17 01/25/2021   PROT 6.9 01/25/2021   ALBUMIN 4.5 01/25/2021   CALCIUM 9.9 01/25/2021   ANIONGAP 10 05/16/2020   EGFR 81 01/25/2021   Lab Results  Component Value Date   CHOL 119 10/24/2020   Lab Results  Component Value Date   HDL 42 10/24/2020   Lab Results  Component Value Date   LDLCALC 61 10/24/2020   Lab Results  Component Value Date   TRIG 77 10/24/2020   Lab Results  Component Value Date   CHOLHDL 2.8 10/24/2020   Lab Results  Component Value Date   HGBA1C 5.8 01/25/2021

## 2021-05-12 LAB — LIPID PANEL
Chol/HDL Ratio: 2.3 ratio (ref 0.0–4.4)
Cholesterol, Total: 132 mg/dL (ref 100–199)
HDL: 57 mg/dL (ref >39)
LDL Chol Calc (NIH): 62 mg/dL (ref 0–99)
Triglycerides: 62 mg/dL (ref 0–149)
VLDL Cholesterol Cal: 13 mg/dL (ref 5–40)

## 2021-05-12 LAB — CBC WITH DIFFERENTIAL/PLATELET
Basophils Absolute: 0 10*3/uL (ref 0.0–0.2)
Basos: 1 %
EOS (ABSOLUTE): 0.1 10*3/uL (ref 0.0–0.4)
Eos: 1 %
Hematocrit: 40.7 % (ref 34.0–46.6)
Hemoglobin: 13.7 g/dL (ref 11.1–15.9)
Immature Grans (Abs): 0 10*3/uL (ref 0.0–0.1)
Immature Granulocytes: 0 %
Lymphocytes Absolute: 2.7 10*3/uL (ref 0.7–3.1)
Lymphs: 46 %
MCH: 29.2 pg (ref 26.6–33.0)
MCHC: 33.7 g/dL (ref 31.5–35.7)
MCV: 87 fL (ref 79–97)
Monocytes Absolute: 0.4 10*3/uL (ref 0.1–0.9)
Monocytes: 7 %
Neutrophils Absolute: 2.6 10*3/uL (ref 1.4–7.0)
Neutrophils: 45 %
Platelets: 227 10*3/uL (ref 150–450)
RBC: 4.69 x10E6/uL (ref 3.77–5.28)
RDW: 13.6 % (ref 11.7–15.4)
WBC: 5.8 10*3/uL (ref 3.4–10.8)

## 2021-05-12 LAB — CMP14+EGFR
ALT: 19 IU/L (ref 0–32)
AST: 20 IU/L (ref 0–40)
Albumin/Globulin Ratio: 1.7 (ref 1.2–2.2)
Albumin: 4.5 g/dL (ref 3.8–4.9)
Alkaline Phosphatase: 86 IU/L (ref 44–121)
BUN/Creatinine Ratio: 11 (ref 9–23)
BUN: 9 mg/dL (ref 6–24)
Bilirubin Total: 0.4 mg/dL (ref 0.0–1.2)
CO2: 24 mmol/L (ref 20–29)
Calcium: 9.7 mg/dL (ref 8.7–10.2)
Chloride: 104 mmol/L (ref 96–106)
Creatinine, Ser: 0.8 mg/dL (ref 0.57–1.00)
Globulin, Total: 2.6 g/dL (ref 1.5–4.5)
Glucose: 82 mg/dL (ref 70–99)
Potassium: 4.1 mmol/L (ref 3.5–5.2)
Sodium: 143 mmol/L (ref 134–144)
Total Protein: 7.1 g/dL (ref 6.0–8.5)
eGFR: 88 mL/min/{1.73_m2} (ref >59)

## 2021-05-17 LAB — TOXASSURE SELECT 13 (MW), URINE

## 2021-05-21 ENCOUNTER — Other Ambulatory Visit: Payer: Self-pay | Admitting: Family Medicine

## 2021-05-21 DIAGNOSIS — K5900 Constipation, unspecified: Secondary | ICD-10-CM

## 2021-06-07 ENCOUNTER — Other Ambulatory Visit: Payer: Self-pay | Admitting: Family Medicine

## 2021-06-19 ENCOUNTER — Other Ambulatory Visit: Payer: Self-pay | Admitting: Family Medicine

## 2021-08-01 ENCOUNTER — Other Ambulatory Visit: Payer: Self-pay | Admitting: Family Medicine

## 2021-08-09 ENCOUNTER — Other Ambulatory Visit: Payer: Self-pay

## 2021-08-09 ENCOUNTER — Ambulatory Visit: Payer: BC Managed Care – PPO | Admitting: Family Medicine

## 2021-08-09 ENCOUNTER — Encounter: Payer: Self-pay | Admitting: Family Medicine

## 2021-08-09 VITALS — BP 116/82 | HR 78 | Temp 97.4°F | Ht 64.0 in | Wt 176.4 lb

## 2021-08-09 DIAGNOSIS — M25511 Pain in right shoulder: Secondary | ICD-10-CM

## 2021-08-09 DIAGNOSIS — Z79899 Other long term (current) drug therapy: Secondary | ICD-10-CM

## 2021-08-09 DIAGNOSIS — G479 Sleep disorder, unspecified: Secondary | ICD-10-CM

## 2021-08-09 DIAGNOSIS — Z23 Encounter for immunization: Secondary | ICD-10-CM | POA: Diagnosis not present

## 2021-08-09 DIAGNOSIS — E1140 Type 2 diabetes mellitus with diabetic neuropathy, unspecified: Secondary | ICD-10-CM | POA: Diagnosis not present

## 2021-08-09 DIAGNOSIS — I10 Essential (primary) hypertension: Secondary | ICD-10-CM | POA: Diagnosis not present

## 2021-08-09 DIAGNOSIS — F331 Major depressive disorder, recurrent, moderate: Secondary | ICD-10-CM

## 2021-08-09 DIAGNOSIS — E782 Mixed hyperlipidemia: Secondary | ICD-10-CM

## 2021-08-09 DIAGNOSIS — K219 Gastro-esophageal reflux disease without esophagitis: Secondary | ICD-10-CM

## 2021-08-09 DIAGNOSIS — Z794 Long term (current) use of insulin: Secondary | ICD-10-CM

## 2021-08-09 DIAGNOSIS — F419 Anxiety disorder, unspecified: Secondary | ICD-10-CM

## 2021-08-09 DIAGNOSIS — G629 Polyneuropathy, unspecified: Secondary | ICD-10-CM | POA: Diagnosis not present

## 2021-08-09 DIAGNOSIS — K5900 Constipation, unspecified: Secondary | ICD-10-CM

## 2021-08-09 DIAGNOSIS — G8929 Other chronic pain: Secondary | ICD-10-CM

## 2021-08-09 DIAGNOSIS — E559 Vitamin D deficiency, unspecified: Secondary | ICD-10-CM

## 2021-08-09 DIAGNOSIS — M545 Low back pain, unspecified: Secondary | ICD-10-CM

## 2021-08-09 LAB — BAYER DCA HB A1C WAIVED: HB A1C (BAYER DCA - WAIVED): 5.8 % — ABNORMAL HIGH (ref 4.8–5.6)

## 2021-08-09 MED ORDER — OZEMPIC (2 MG/DOSE) 8 MG/3ML ~~LOC~~ SOPN: 2.0000 mg | PEN_INJECTOR | SUBCUTANEOUS | 5 refills | Status: DC

## 2021-08-09 NOTE — Progress Notes (Signed)
Assessment & Plan:  1. Type 2 diabetes mellitus with diabetic neuropathy, with long-term current use of insulin (HCC) Lab Results  Component Value Date   HGBA1C 5.8 (H) 08/09/2021   HGBA1C 6.0 (H) 05/11/2021   HGBA1C 5.8 01/25/2021    - Diabetes is at goal of A1c < 7. - Medications: continue current medications with an increase in Ozempic from 1 mg to 2 mg to maximize benefits from GLP. - Home glucose monitoring: continue monitoring and keeping a log - Patient is currently taking a statin. Patient is taking an ACE-inhibitor/ARB.   Diabetes Health Maintenance Due  Topic Date Due   OPHTHALMOLOGY EXAM  09/20/2021   HEMOGLOBIN A1C  02/06/2022   FOOT EXAM  05/11/2022    Lab Results  Component Value Date   LABMICR See below: 11/22/2020   LABMICR 15.8 11/18/2019   - Lipid panel - CBC with Differential/Platelet - CMP14+EGFR - Bayer DCA Hb A1c Waived - Semaglutide, 2 MG/DOSE, (OZEMPIC, 2 MG/DOSE,) 8 MG/3ML SOPN; Inject 2 mg into the skin once a week.  Dispense: 3 mL; Refill: 5 - Pneumococcal conjugate vaccine 20-valent (Prevnar 20)  2. Neuropathy Well controlled on current regimen.  - CMP14+EGFR  3. Essential hypertension Well controlled on current regimen.  - Lipid panel - CBC with Differential/Platelet - CMP14+EGFR  4. Mixed hyperlipidemia Well controlled on current regimen.  - Lipid panel - CMP14+EGFR  5. Recurrent moderate major depressive disorder with anxiety ([place]) Well controlled on current regimen. Managed by psychiatry.  - CMP14+EGFR  6-8. Chronic bilateral low back pain without sciatica/Chronic right shoulder pain/Controlled substance agreement signed Well controlled on current regimen. Controlled substance agreement in place. Urine drug screen as expected. PDMP reviewed with no concerning findings.  - CMP14+EGFR  9. Constipation, unspecified constipation type Well controlled on current regimen.  - CMP14+EGFR  10. Gastroesophageal reflux disease,  unspecified whether esophagitis present Well controlled on current regimen.  - CMP14+EGFR  11. Vitamin D deficiency Well controlled on current regimen.   12. Immunization due - Pneumococcal conjugate vaccine 20-valent (Prevnar 20)  13. Difficulty sleeping - Discussed with patient that I feel her being tired during the day is related to the fact that she is not sleeping well. Her Trazodone helps with sleep, but she does not tolerate higher doses. Discussed possibility of switching this to something else for sleep, but this medication also helps with depression and is prescribed to her by psychiatry. I have placed a call to her provider, Tammy Mayo, to discuss options for our mutual patient and am awaiting a call back. She does have an appointment with him tomorrow and I advised if she does not hear back from me before then, to make sure she mentions it to him as well.    Return in about 6 weeks (around 09/20/2021) for Sleep.  Hendricks Limes, MSN, APRN, FNP-C [facility]  Subjective:    Patient ID: Tammy Mayo, female    DOB: [birth year], 56 y.o.   MRN: [id number]  Patient Care Team: Loman Brooklyn, FNP as PCP - General (Family Medicine) Celestia Khat, [place] (Optometry)   Chief Complaint:  Chief Complaint  Patient presents with   Diabetes   Hypertension    3 month follow up of chronic medical conditions    Fatigue    States it has been going on for while    HPI: Tammy Mayo is a 56 y.o. female presenting on 08/09/2021 for Diabetes, Hypertension (3 month follow up of  chronic medical conditions ), and Fatigue (States it has been going on for while)  Diabetes: Patient presents for follow up of diabetes. Current symptoms include: hyperglycemia. Known diabetic complications: peripheral neuropathy, which she takes gabapentin for. Medication compliance: yes. Current diet: in general, an "unhealthy" diet. Current exercise: none. Home blood sugar  records: BGs have been labile ranging between 83 and 198 . Only 4/118 > 180 which was at Christmas time. Is she  on ACE inhibitor or angiotensin II receptor blocker? Yes (Lisionpril). Is she on a statin? Yes (rosuvastatin).   GERD: taking omeprazole daily.  Constipation: controlled with Miralax and Colace daily.   Vitamin D Deficiency: taking vitamin D 1,000 units daily. Last vitamin D level in March 2022 was normal.  Depression: patient is seeing a psychiatrist and doing counseling. They are adjusting her medications which she feels is helping.   Depression screen [facility] 2/9 08/09/2021 05/11/2021 01/25/2021  Decreased Interest _0 Down, Depressed, Hopeless _1 PHQ - 2 Score _2 Altered sleeping 2 2 0  Tired, decreased energy 3 0 3  Change in appetite _3 Feeling bad or failure about yourself  _4 Trouble concentrating _5 Moving slowly or fidgety/restless 0 1 2  Suicidal thoughts 0 3 2  PHQ-9 Score _6 Difficult doing work/chores Very difficult Very difficult Very difficult   GAD 7 : Generalized Anxiety Score 08/09/2021 05/11/2021 01/25/2021 10/24/2020  Nervous, Anxious, on Edge _7 Control/stop worrying _8 Worry too much - different things 1 2 0 2  Trouble relaxing _9 Restless _10 Easily annoyed or irritable _11 Afraid - awful might happen _12 Total GAD 7 Score _13 Anxiety Difficulty Very difficult Very difficult Very difficult Very difficult    Pain assessment: Cause of pain- lumbar fusion at L4-L5 with screws and a spacer Pain location- low back Pain on scale of 1-10- 5/10 with medication; 9/10 without medication Frequency- daily What increases pain- house chores and bending What makes pain better- Biofreeze, rest, and pain medication Effects on ADL - makes difficult Any change in general medical condition- no  Current opioids rx- Norco 5/325 - patient is only taking as needed which is about twice weekly #  meds rx- 30 (which is lasting 3 months at a time) Effectiveness of current meds- good Adverse reactions from pain meds- none Morphine equivalent- 5 MME/day  Pill count performed-No Last drug screen - 05/11/2021 ( high risk q5m moderate risk q672mlow risk yearly ) Urine drug screen today- No Was the NCDerryeviewed- Yes  If yes were their any concerning findings? - No  Overdose risk: 180  Opioid Risk  12/17/2019  Alcohol 3  Illegal Drugs 0  Rx Drugs 0  Alcohol 0  Illegal Drugs 0  Rx Drugs 0  Age between 16-45 years  0  History of Preadolescent Sexual Abuse 0  Psychological Disease 0  Depression 1  Opioid Risk Tool Scoring 4  Opioid Risk Interpretation Moderate Risk   Pain contract signed on: updated on 01/25/2021   New complaints: Patient reports feeling extra tired for months now. She is not sleeping well. She has trouble both falling and staying asleep. Denies snoring. She does take Trazodone 100 mg at bedtime which she reports helps sometimes. She has  been on it for over a year. She previously tried taking 150 mg and felt sluggish the next day. This is prescribed by her psychiatrist, Tammy Mayo, [place].   Social history:  Relevant past medical, surgical, family and social history reviewed and updated as indicated. Interim medical history since our last visit reviewed.  Allergies and medications reviewed and updated.  DATA REVIEWED: CHART IN EPIC  ROS: Negative unless specifically indicated above in HPI.    Current Outpatient Medications:    ARIPiprazole ER (ABILIFY MAINTENA) 300 MG PRSY prefilled syringe, Inject 400 mg into the muscle every 28 (twenty-eight) days., Disp: , Rfl:    Biotin 10000 MCG TABS, Take 10,000 mcg by mouth daily. , Disp: , Rfl:    cetirizine (ZYRTEC) 10 MG tablet, Take 10 mg by mouth daily., Disp: , Rfl:    cholecalciferol (VITAMIN D) 1000 UNITS tablet, Take 1,000 Units by mouth daily., Disp: , Rfl:    docusate sodium (COLACE) 100 MG capsule,  Take 100 mg by mouth daily., Disp: , Rfl:    DULoxetine (CYMBALTA) 30 MG capsule, Take 30 mg by mouth 2 (two) times daily., Disp: , Rfl:    gabapentin (NEURONTIN) 300 MG capsule, Take 1 capsule (300 mg total) by mouth 2 (two) times daily., Disp: 180 capsule, Rfl: 1   GAVILAX 17 GM/SCOOP powder, MIX 17 GRAMS INTO 8OZ OF WATER AND DRINK DAILY, Disp: 510 g, Rfl: 0   HYDROcodone-acetaminophen (NORCO/VICODIN) 5-325 MG tablet, Take 1 tablet by mouth daily as needed for moderate pain., Disp: 30 tablet, Rfl: 0   lisinopril (ZESTRIL) 20 MG tablet, Take 1 tablet (20 mg total) by mouth daily., Disp: 90 tablet, Rfl: 1   omeprazole (PRILOSEC) 20 MG capsule, Take 1 capsule (20 mg total) by mouth daily., Disp: 90 capsule, Rfl: 1   oxymetazoline (AFRIN) 0.05 % nasal spray, Place 1 spray into both nostrils 2 (two) times daily as needed for congestion (over the counter nasal spray)., Disp: , Rfl:    rosuvastatin (CRESTOR) 10 MG tablet, TAKE ONE (1) TABLET EACH DAY, Disp: 90 tablet, Rfl: 0   Semaglutide, 1 MG/DOSE, (OZEMPIC, 1 MG/DOSE,) 4 MG/3ML SOPN, Inject 1 mg into the skin once a week., Disp: 9 mL, Rfl: 0   traZODone (DESYREL) 100 MG tablet, Take 100 mg by mouth at bedtime. , Disp: , Rfl:    TRUEplus Lancets 30G MISC, 1 Device by Does not apply route 4 (four) times daily as needed., Disp: 100 each, Rfl: 3   XIGDUO XR 04-999 MG TB24, TAKE ONE (1) TABLET EACH DAY, Disp: 90 tablet, Rfl: 0   No Known Allergies Past Medical History:  Diagnosis Date   Anemia    Arthritis    Chronic back pain    lumbar 4-5 slip and radicular right leg pain   COVID-19 07/27/2019   Depression    Diabetes mellitus without complication (HCC)    GERD (gastroesophageal reflux disease)    Hyperlipidemia    Hypertension    Neuropathy    Seasonal allergies    Vitamin D deficiency    Wears glasses     Past Surgical History:  Procedure Laterality Date   ARTHOSCOPIC ROTAOR CUFF REPAIR Right 05/18/2020   Procedure: ARTHROSCOPIC  ROTATOR CUFF REPAIR;  Surgeon: Mordecai Rasmussen, MD;  Location: [facility];  Service: Orthopedics;  Laterality: Right;   BACK SURGERY  05/2015   lumbar fusion   BICEPT TENODESIS Right 05/18/2020   Procedure: BICEPS TENOTOMY;  Surgeon: Mordecai Rasmussen, MD;  Location: [facility];  Service: Orthopedics;  Laterality: Right;   CESAREAN SECTION     x 1   CHOLECYSTECTOMY N/A 11/13/2015   Procedure: LAPAROSCOPIC CHOLECYSTECTOMY;  Surgeon: Rolm Bookbinder, MD;  Location: [facility];  Service: General;  Laterality: N/A;   DILATION AND CURETTAGE OF UTERUS     HERNIA REPAIR     umbilical hernia repair as a child   TUBAL LIGATION      Social History   Socioeconomic History   Marital status: Married    Spouse name: Not on file   Number of children: Not on file   Years of education: Not on file   Highest education level: Not on file  Occupational History   Not on file  Tobacco Use   Smoking status: Never   Smokeless tobacco: Never  Vaping Use   Vaping Use: Never used  Substance and Sexual Activity   Alcohol use: No   Drug use: No   Sexual activity: Yes    Birth control/protection: None  Other Topics Concern   Not on file  Social History Narrative   Not on file   Social Determinants of Health   Financial Resource Strain: Not on file  Food Insecurity: Not on file  Transportation Needs: Not on file  Physical Activity: Not on file  Stress: Not on file  Social Connections: Not on file  Intimate Partner Violence: Not on file        Objective:    BP 116/82    Pulse 78    Temp (!) 97.4 F (36.3 C) (Temporal)    Ht $R'5\' 4"'wZ$  (1.626 m)    Wt 176 lb 6.4 oz (80 kg)    LMP  (LMP Unknown)    SpO2 98%    BMI 30.28 kg/m   Wt Readings from Last 3 Encounters:  08/09/21 176 lb 6.4 oz (80 kg)  05/11/21 175 lb (79.4 kg)  01/25/21 173 lb 6.4 oz (78.7 kg)    Physical Exam Vitals reviewed.  Constitutional:      General: She is not in acute distress.    Appearance: Normal appearance. She is obese. She is not  ill-appearing, toxic-appearing or diaphoretic.  HENT:     Head: Normocephalic and atraumatic.  Eyes:     General: No scleral icterus.       Right eye: No discharge.        Left eye: No discharge.     Conjunctiva/sclera: Conjunctivae normal.  Cardiovascular:     Rate and Rhythm: Normal rate and regular rhythm.     Heart sounds: Normal heart sounds. No murmur heard.   No friction rub. No gallop.  Pulmonary:     Effort: Pulmonary effort is normal. No respiratory distress.     Breath sounds: Normal breath sounds. No stridor. No wheezing, rhonchi or rales.  Musculoskeletal:        General: Normal range of motion.     Cervical back: Normal range of motion.  Skin:    General: Skin is warm and dry.     Capillary Refill: Capillary refill takes less than 2 seconds.  Neurological:     General: No focal deficit present.     Mental Status: She is alert and oriented to person, place, and time. Mental status is at baseline.  Psychiatric:        Mood and Affect: Mood normal.        Behavior: Behavior normal.        Thought Content: Thought  content normal.        Judgment: Judgment normal.    Lab Results  Component Value Date   TSH 1.130 10/24/2020   Lab Results  Component Value Date   WBC 5.8 05/11/2021   HGB 13.7 05/11/2021   HCT 40.7 05/11/2021   MCV 87 05/11/2021   PLT 227 05/11/2021   Lab Results  Component Value Date   NA 143 05/11/2021   K 4.1 05/11/2021   CO2 24 05/11/2021   GLUCOSE 82 05/11/2021   BUN 9 05/11/2021   CREATININE 0.80 05/11/2021   BILITOT 0.4 05/11/2021   ALKPHOS 86 05/11/2021   AST 20 05/11/2021   ALT 19 05/11/2021   PROT 7.1 05/11/2021   ALBUMIN 4.5 05/11/2021   CALCIUM 9.7 05/11/2021   ANIONGAP 10 05/16/2020   EGFR 88 05/11/2021   Lab Results  Component Value Date   CHOL 132 05/11/2021   Lab Results  Component Value Date   HDL 57 05/11/2021   Lab Results  Component Value Date   LDLCALC 62 05/11/2021   Lab Results  Component Value Date    TRIG 62 05/11/2021   Lab Results  Component Value Date   CHOLHDL 2.3 05/11/2021   Lab Results  Component Value Date   HGBA1C 6.0 (H) 05/11/2021

## 2021-08-10 ENCOUNTER — Telehealth: Payer: Self-pay | Admitting: Family Medicine

## 2021-08-10 LAB — CBC WITH DIFFERENTIAL/PLATELET
Basophils Absolute: 0 10*3/uL (ref 0.0–0.2)
Basos: 0 %
EOS (ABSOLUTE): 0 10*3/uL (ref 0.0–0.4)
Eos: 1 %
Hematocrit: 42.7 % (ref 34.0–46.6)
Hemoglobin: 13.8 g/dL (ref 11.1–15.9)
Immature Grans (Abs): 0 10*3/uL (ref 0.0–0.1)
Immature Granulocytes: 0 %
Lymphocytes Absolute: 2.3 10*3/uL (ref 0.7–3.1)
Lymphs: 43 %
MCH: 28.7 pg (ref 26.6–33.0)
MCHC: 32.3 g/dL (ref 31.5–35.7)
MCV: 89 fL (ref 79–97)
Monocytes Absolute: 0.4 10*3/uL (ref 0.1–0.9)
Monocytes: 7 %
Neutrophils Absolute: 2.6 10*3/uL (ref 1.4–7.0)
Neutrophils: 49 %
Platelets: 240 10*3/uL (ref 150–450)
RBC: 4.81 x10E6/uL (ref 3.77–5.28)
RDW: 12.8 % (ref 11.7–15.4)
WBC: 5.4 10*3/uL (ref 3.4–10.8)

## 2021-08-10 LAB — CMP14+EGFR
ALT: 17 IU/L (ref 0–32)
AST: 20 IU/L (ref 0–40)
Albumin/Globulin Ratio: 1.8 (ref 1.2–2.2)
Albumin: 4.6 g/dL (ref 3.8–4.9)
Alkaline Phosphatase: 91 IU/L (ref 44–121)
BUN/Creatinine Ratio: 10 (ref 9–23)
BUN: 9 mg/dL (ref 6–24)
Bilirubin Total: 0.4 mg/dL (ref 0.0–1.2)
CO2: 27 mmol/L (ref 20–29)
Calcium: 10 mg/dL (ref 8.7–10.2)
Chloride: 108 mmol/L — ABNORMAL HIGH (ref 96–106)
Creatinine, Ser: 0.92 mg/dL (ref 0.57–1.00)
Globulin, Total: 2.5 g/dL (ref 1.5–4.5)
Glucose: 75 mg/dL (ref 70–99)
Potassium: 4.1 mmol/L (ref 3.5–5.2)
Sodium: 146 mmol/L — ABNORMAL HIGH (ref 134–144)
Total Protein: 7.1 g/dL (ref 6.0–8.5)
eGFR: 74 mL/min/{1.73_m2} (ref >59)

## 2021-08-10 LAB — LIPID PANEL
Chol/HDL Ratio: 2.6 ratio (ref 0.0–4.4)
Cholesterol, Total: 135 mg/dL (ref 100–199)
HDL: 52 mg/dL (ref >39)
LDL Chol Calc (NIH): 69 mg/dL (ref 0–99)
Triglycerides: 71 mg/dL (ref 0–149)
VLDL Cholesterol Cal: 14 mg/dL (ref 5–40)

## 2021-08-10 NOTE — Telephone Encounter (Signed)
This request has received a Favorable outcome from [facility] [place].  Please keep in mind this is not a guarantee of payment. Eligibility and Benefit determinations will be made at the time of service.  Please note any additional information provided by [facility] [place] at the bottom of the screen. 

## 2021-08-12 ENCOUNTER — Encounter: Payer: Self-pay | Admitting: Family Medicine

## 2021-08-13 ENCOUNTER — Other Ambulatory Visit: Payer: Self-pay | Admitting: Family Medicine

## 2021-08-13 DIAGNOSIS — E1140 Type 2 diabetes mellitus with diabetic neuropathy, unspecified: Secondary | ICD-10-CM

## 2021-08-13 DIAGNOSIS — Z794 Long term (current) use of insulin: Secondary | ICD-10-CM

## 2021-08-13 MED ORDER — DAPAGLIFLOZIN PROPANEDIOL 10 MG PO TABS: 10.0000 mg | ORAL_TABLET | Freq: Every day | ORAL | 1 refills | Status: DC

## 2021-08-20 ENCOUNTER — Other Ambulatory Visit: Payer: Self-pay | Admitting: Family Medicine

## 2021-09-20 ENCOUNTER — Ambulatory Visit: Payer: BC Managed Care – PPO | Admitting: Family Medicine

## 2021-09-20 ENCOUNTER — Encounter: Payer: Self-pay | Admitting: Family Medicine

## 2021-09-20 VITALS — BP 107/77 | HR 92 | Temp 97.6°F | Ht 64.0 in | Wt 172.4 lb

## 2021-09-20 DIAGNOSIS — G479 Sleep disorder, unspecified: Secondary | ICD-10-CM

## 2021-09-20 DIAGNOSIS — R42 Dizziness and giddiness: Secondary | ICD-10-CM

## 2021-09-20 DIAGNOSIS — Z23 Encounter for immunization: Secondary | ICD-10-CM | POA: Diagnosis not present

## 2021-09-20 NOTE — Progress Notes (Signed)
Assessment & Plan:  1. Difficulty sleeping Well controlled on current regimen.   2. Dizzy spells Encouraged to drink more water throughout the day and stand up slowly, giving herself time before she starts walking to prevent falls. Education provided on dizziness.    Return on/after 01/25/2022, for annual physical.  Hendricks Limes, MSN, APRN, FNP-C [name]  Subjective:    Patient ID: Tammy Mayo, female    DOB: [birth year], 56 y.o.   MRN: [id number]  Patient Care Team: Loman Brooklyn, FNP as PCP - General (Family Medicine) Celestia Khat, [place] (Optometry)   Chief Complaint:  Chief Complaint  Patient presents with   Sleeping Problem    6 week follow up- better     HPI: Tammy Mayo is a 56 y.o. female presenting on 09/20/2021 for Sleeping Problem (6 week follow up- better/)  At our last visit patient was feeling tired during the day and not sleeping well at night.  At that time she was taking trazodone that was managed by her psychiatrist and since I did not want to change a medication someone else was prescribing I reached out to him.  Patient reports today that he stopped the trazodone and started her on Cap[place]ta and now she is sleeping much better.   New complaints: Today she reports recently she has been getting dizzy upon standing. This does not occur when changing positions from lying to standing or when turning over in the bed. Denies chest pain, shortness of breath, and allergy symptoms. She drinks four cups of water per day.   Social history:  Relevant past medical, surgical, family and social history reviewed and updated as indicated. Interim medical history since our last visit reviewed.  Allergies and medications reviewed and updated.  DATA REVIEWED: CHART IN EPIC  ROS: Negative unless specifically indicated above in HPI.    Current Outpatient Medications:    ARIPiprazole (ABILIFY) 30 MG tablet, Take 30 mg by mouth  daily., Disp: , Rfl:    Biotin 10000 MCG TABS, Take 10,000 mcg by mouth daily. , Disp: , Rfl:    CAPLYTA 42 MG capsule, Take 42 mg by mouth daily., Disp: , Rfl:    cetirizine (ZYRTEC) 10 MG tablet, Take 10 mg by mouth daily., Disp: , Rfl:    cholecalciferol (VITAMIN D) 1000 UNITS tablet, Take 1,000 Units by mouth daily., Disp: , Rfl:    dapagliflozin propanediol (FARXIGA) 10 MG TABS tablet, Take 1 tablet (10 mg total) by mouth daily before breakfast., Disp: 90 tablet, Rfl: 1   docusate sodium (COLACE) 100 MG capsule, Take 100 mg by mouth daily., Disp: , Rfl:    DULoxetine (CYMBALTA) 30 MG capsule, Take 30 mg by mouth 2 (two) times daily., Disp: , Rfl:    gabapentin (NEURONTIN) 300 MG capsule, Take 1 capsule (300 mg total) by mouth 2 (two) times daily., Disp: 180 capsule, Rfl: 1   GAVILAX 17 GM/SCOOP powder, MIX 17 GRAMS INTO 8OZ OF WATER AND DRINK DAILY, Disp: 510 g, Rfl: 0   HYDROcodone-acetaminophen (NORCO/VICODIN) 5-325 MG tablet, Take 1 tablet by mouth daily as needed for moderate pain., Disp: 30 tablet, Rfl: 0   lisinopril (ZESTRIL) 20 MG tablet, Take 1 tablet (20 mg total) by mouth daily., Disp: 90 tablet, Rfl: 1   omeprazole (PRILOSEC) 20 MG capsule, Take 1 capsule (20 mg total) by mouth daily., Disp: 90 capsule, Rfl: 1   oxymetazoline (AFRIN) 0.05 % nasal spray, Place 1 spray into both nostrils  2 (two) times daily as needed for congestion (over the counter nasal spray)., Disp: , Rfl:    rosuvastatin (CRESTOR) 10 MG tablet, TAKE ONE (1) TABLET EACH DAY, Disp: 90 tablet, Rfl: 0   Semaglutide, 2 MG/DOSE, (OZEMPIC, 2 MG/DOSE,) 8 MG/3ML SOPN, Inject 2 mg into the skin once a week., Disp: 3 mL, Rfl: 5   TRUEplus Lancets 30G MISC, 1 Device by Does not apply route 4 (four) times daily as needed., Disp: 100 each, Rfl: 3   traZODone (DESYREL) 100 MG tablet, Take 100 mg by mouth at bedtime.  (Patient not taking: Reported on 09/20/2021), Disp: , Rfl:    No Known Allergies Past Medical History:   Diagnosis Date   Anemia    Arthritis    Chronic back pain    lumbar 4-5 slip and radicular right leg pain   COVID-19 07/27/2019   Depression    Diabetes mellitus without complication (HCC)    GERD (gastroesophageal reflux disease)    Hyperlipidemia    Hypertension    Neuropathy    Seasonal allergies    Vitamin D deficiency    Wears glasses     Past Surgical History:  Procedure Laterality Date   ARTHOSCOPIC ROTAOR CUFF REPAIR Right 05/18/2020   Procedure: ARTHROSCOPIC ROTATOR CUFF REPAIR;  Surgeon: Mordecai Rasmussen, MD;  Location: [facility];  Service: Orthopedics;  Laterality: Right;   BACK SURGERY  05/2015   lumbar fusion   BICEPT TENODESIS Right 05/18/2020   Procedure: BICEPS TENOTOMY;  Surgeon: Mordecai Rasmussen, MD;  Location: [facility];  Service: Orthopedics;  Laterality: Right;   CESAREAN SECTION     x 1   CHOLECYSTECTOMY N/A 11/13/2015   Procedure: LAPAROSCOPIC CHOLECYSTECTOMY;  Surgeon: Rolm Bookbinder, MD;  Location: [facility];  Service: General;  Laterality: N/A;   DILATION AND CURETTAGE OF UTERUS     HERNIA REPAIR     umbilical hernia repair as a child   TUBAL LIGATION      Social History   Socioeconomic History   Marital status: Married    Spouse name: Not on file   Number of children: Not on file   Years of education: Not on file   Highest education level: Not on file  Occupational History   Not on file  Tobacco Use   Smoking status: Never   Smokeless tobacco: Never  Vaping Use   Vaping Use: Never used  Substance and Sexual Activity   Alcohol use: No   Drug use: No   Sexual activity: Yes    Birth control/protection: None  Other Topics Concern   Not on file  Social History Narrative   Not on file   Social Determinants of Health   Financial Resource Strain: Not on file  Food Insecurity: Not on file  Transportation Needs: Not on file  Physical Activity: Not on file  Stress: Not on file  Social Connections: Not on file  Intimate Partner Violence: Not on  file        Objective:    BP 107/77    Pulse 92    Temp 97.6 F (36.4 C) (Temporal)    Ht '5\' 4"'  (1.626 m)    Wt 172 lb 6.4 oz (78.2 kg)    LMP  (LMP Unknown)    SpO2 97%    BMI 29.59 kg/m   Wt Readings from Last 3 Encounters:  09/20/21 172 lb 6.4 oz (78.2 kg)  08/09/21 176 lb 6.4 oz (80 kg)  05/11/21 175 lb (  79.4 kg)   Orthostatic VS for the past 72 hrs (Last 3 readings):  Orthostatic BP Patient Position BP Location Cuff Size Orthostatic Pulse  09/20/21 1415 103/76 Standing Right Arm Normal 128  09/20/21 1414 116/78 Sitting Right Arm Normal 66  09/20/21 1413 106/74 Supine Right Arm Normal 66   Physical Exam Vitals reviewed.  Constitutional:      General: She is not in acute distress.    Appearance: Normal appearance. She is not ill-appearing, toxic-appearing or diaphoretic.  HENT:     Head: Normocephalic and atraumatic.     Right Ear: Tympanic membrane, ear canal and external ear normal. There is no impacted cerumen.     Left Ear: Tympanic membrane, ear canal and external ear normal. There is no impacted cerumen.     Nose: Nose normal. No congestion or rhinorrhea.     Mouth/Throat:     Mouth: Mucous membranes are moist.     Pharynx: Oropharynx is clear. No oropharyngeal exudate or posterior oropharyngeal erythema.  Eyes:     General: No scleral icterus.       Right eye: No discharge.        Left eye: No discharge.     Conjunctiva/sclera: Conjunctivae normal.  Neck:     Vascular: No carotid bruit.  Cardiovascular:     Rate and Rhythm: Normal rate and regular rhythm.     Heart sounds: Normal heart sounds. No murmur heard.   No friction rub. No gallop.  Pulmonary:     Effort: Pulmonary effort is normal. No respiratory distress.     Breath sounds: Normal breath sounds. No stridor. No wheezing, rhonchi or rales.  Musculoskeletal:        General: Normal range of motion.     Cervical back: Normal range of motion.  Lymphadenopathy:     Cervical: No cervical adenopathy.   Skin:    General: Skin is warm and dry.     Capillary Refill: Capillary refill takes less than 2 seconds.  Neurological:     General: No focal deficit present.     Mental Status: She is alert and oriented to person, place, and time. Mental status is at baseline.  Psychiatric:        Mood and Affect: Mood normal.        Behavior: Behavior normal.        Thought Content: Thought content normal.        Judgment: Judgment normal.    Lab Results  Component Value Date   TSH 1.130 10/24/2020   Lab Results  Component Value Date   WBC 5.4 08/09/2021   HGB 13.8 08/09/2021   HCT 42.7 08/09/2021   MCV 89 08/09/2021   PLT 240 08/09/2021   Lab Results  Component Value Date   NA 146 (H) 08/09/2021   K 4.1 08/09/2021   CO2 27 08/09/2021   GLUCOSE 75 08/09/2021   BUN 9 08/09/2021   CREATININE 0.92 08/09/2021   BILITOT 0.4 08/09/2021   ALKPHOS 91 08/09/2021   AST 20 08/09/2021   ALT 17 08/09/2021   PROT 7.1 08/09/2021   ALBUMIN 4.6 08/09/2021   CALCIUM 10.0 08/09/2021   ANIONGAP 10 05/16/2020   EGFR 74 08/09/2021   Lab Results  Component Value Date   CHOL 135 08/09/2021   Lab Results  Component Value Date   HDL 52 08/09/2021   Lab Results  Component Value Date   LDLCALC 69 08/09/2021   Lab Results  Component Value Date  TRIG 71 08/09/2021   Lab Results  Component Value Date   CHOLHDL 2.6 08/09/2021   Lab Results  Component Value Date   HGBA1C 5.8 (H) 08/09/2021

## 2021-09-24 LAB — HM DIABETES EYE EXAM

## 2021-11-01 ENCOUNTER — Other Ambulatory Visit: Payer: Self-pay | Admitting: Family Medicine

## 2021-11-07 ENCOUNTER — Other Ambulatory Visit: Payer: Self-pay | Admitting: Family Medicine

## 2021-11-07 DIAGNOSIS — I10 Essential (primary) hypertension: Secondary | ICD-10-CM

## 2021-12-05 ENCOUNTER — Telehealth: Payer: Self-pay | Admitting: Family Medicine

## 2021-12-05 ENCOUNTER — Other Ambulatory Visit: Payer: Self-pay | Admitting: Family Medicine

## 2021-12-06 NOTE — Telephone Encounter (Signed)
Patient aware that she has refills at pharmacy.  

## 2021-12-20 ENCOUNTER — Other Ambulatory Visit: Payer: Self-pay | Admitting: Family Medicine

## 2022-01-16 ENCOUNTER — Other Ambulatory Visit: Payer: Self-pay | Admitting: Family Medicine

## 2022-01-16 DIAGNOSIS — E1140 Type 2 diabetes mellitus with diabetic neuropathy, unspecified: Secondary | ICD-10-CM

## 2022-01-30 ENCOUNTER — Ambulatory Visit (INDEPENDENT_AMBULATORY_CARE_PROVIDER_SITE_OTHER): Payer: Medicare Other | Admitting: Family Medicine

## 2022-01-30 ENCOUNTER — Encounter: Payer: Self-pay | Admitting: Family Medicine

## 2022-01-30 VITALS — BP 124/83 | HR 87 | Temp 98.1°F | Ht 64.0 in | Wt 172.2 lb

## 2022-01-30 DIAGNOSIS — I10 Essential (primary) hypertension: Secondary | ICD-10-CM

## 2022-01-30 DIAGNOSIS — E782 Mixed hyperlipidemia: Secondary | ICD-10-CM

## 2022-01-30 DIAGNOSIS — Z794 Long term (current) use of insulin: Secondary | ICD-10-CM

## 2022-01-30 DIAGNOSIS — E1140 Type 2 diabetes mellitus with diabetic neuropathy, unspecified: Secondary | ICD-10-CM | POA: Diagnosis not present

## 2022-01-30 DIAGNOSIS — Z0000 Encounter for general adult medical examination without abnormal findings: Secondary | ICD-10-CM

## 2022-01-30 LAB — BAYER DCA HB A1C WAIVED: HB A1C (BAYER DCA - WAIVED): 5.8 % — ABNORMAL HIGH (ref 4.8–5.6)

## 2022-01-30 NOTE — Progress Notes (Signed)
Assessment & Plan:  Well adult exam Discussed health benefits of physical activity, and encouraged her to engage in regular exercise appropriate for her age and condition. Preventive health education provided. Declined Hep C/HIV screening and COVID booster.  Immunization History  Administered Date(s) Administered   Influenza,inj,Quad PF,6+ Mos 05/19/2014, 05/05/2020, 05/11/2021   Moderna Sars-Covid-2 Vaccination 10/13/2019, 11/08/2019, 06/27/2020   PNEUMOCOCCAL CONJUGATE-20 08/09/2021   Pneumococcal Polysaccharide-23 07/25/2020   Td 03/14/2007   Tdap 03/14/2007, 11/17/2016   Zoster Recombinat (Shingrix) 05/11/2021, 09/20/2021   Health Maintenance  Topic Date Due   OPHTHALMOLOGY EXAM  01/30/2022 (Originally 09/20/2021)   COVID-19 Vaccine (4 - Booster for Moderna series) 02/15/2022 (Originally 08/22/2020)   Hepatitis C Screening  01/31/2023 (Originally 01/12/1985)   HIV Screening  01/31/2023 (Originally 01/12/1982)   HEMOGLOBIN A1C  02/06/2022   INFLUENZA VACCINE  02/26/2022   FOOT EXAM  05/11/2022   MAMMOGRAM  04/04/2023   PAP SMEAR-Modifier  01/25/2026   COLONOSCOPY (Pts 45-90yr Insurance coverage will need to be confirmed)  05/02/2026   TETANUS/TDAP  11/18/2026   Zoster Vaccines- Shingrix  Completed   HPV VACCINES  Aged Out   - Lipid panel - CBC with Differential/Platelet - CMP14+EGFR  2. Type 2 diabetes mellitus with diabetic neuropathy, with long-term current use of insulin (HCC) Lab Results  Component Value Date   HGBA1C 5.8 (H) 01/30/2022   HGBA1C 5.8 (H) 08/09/2021   HGBA1C 6.0 (H) 05/11/2021    - Diabetes is at goal of A1c < 7. - Medications: continue current medications - Patient is currently taking a statin. Patient is taking an ACE-inhibitor/ARB.   Diabetes Health Maintenance Due  Topic Date Due   OPHTHALMOLOGY EXAM  01/30/2022 (Originally 09/20/2021)   HEMOGLOBIN A1C  02/06/2022   FOOT EXAM  05/11/2022    Lab Results  Component Value Date   LABMICR See  below: 11/22/2020   LABMICR 15.8 11/18/2019   - Lipid panel - CBC with Differential/Platelet - CMP14+EGFR - Bayer DCA Hb A1c Waived - Microalbumin / creatinine urine ratio  3. Essential hypertension Well controlled on current regimen.  - Lipid panel - CBC with Differential/Platelet - CMP14+EGFR  4. Mixed hyperlipidemia Well controlled on current regimen.  - Lipid panel - CBC with Differential/Platelet - CMP14+EGFR   Follow-up: Return in about 6 months (around 08/02/2022) for follow-up of chronic medication conditions.   BHendricks Limes MSN, APRN, FNP-C [facility]  Subjective:  Patient ID: Tammy Mayo female    DOB: [birth year] Age: 56y.o. MRN: [id number] Patient Care Team: JLoman Brooklyn FNP as PCP - General (Family Medicine) JCelestia Khat OManchester(Optometry)   CC:  Chief Complaint  Patient presents with   Annual Exam    HPI Tammy KARRASis a 56y.o. female who presents today for a complete physical exam. She reports consuming a general diet. The patient does not participate in regular exercise at present. She generally feels well. She reports sleeping well. She does not have additional problems to discuss today.   Vision:Within last year Dental:Receives regular dental care  DEPRESSION SCREENING    01/30/2022    3:01 PM 09/20/2021    3:07 PM 08/09/2021    2:32 PM 05/11/2021    2:38 PM 01/25/2021    4:01 PM 11/22/2020    3:28 PM 10/24/2020    3:23 PM  PHQ 2/9 Scores  PHQ - 2 Score '3 3 2 4 4  4  ' PHQ- 9 Score 11 13 13  '15 18  16  ' Exception Documentation      Medical reason      Review of Systems  Constitutional:  Negative for chills, fever, malaise/fatigue and weight loss.  HENT:  Positive for tinnitus (bilateral with reports of normal hearing testing). Negative for congestion, ear discharge, ear pain, hearing loss, nosebleeds, sinus pain and sore throat.   Eyes:  Negative for blurred vision, double vision, pain, discharge and  redness.  Respiratory:  Negative for cough, shortness of breath and wheezing.   Cardiovascular:  Negative for chest pain, palpitations and leg swelling.  Gastrointestinal:  Negative for abdominal pain, constipation, diarrhea, heartburn, nausea and vomiting.  Genitourinary:  Negative for dysuria, frequency and urgency.  Musculoskeletal:  Negative for myalgias.  Skin:  Negative for rash.  Neurological:  Negative for dizziness, seizures, weakness and headaches.  Psychiatric/Behavioral:  Negative for depression, substance abuse and suicidal ideas. The patient is not nervous/anxious.      Current Outpatient Medications:    ARIPiprazole (ABILIFY) 30 MG tablet, Take 30 mg by mouth daily., Disp: , Rfl:    Biotin 10000 MCG TABS, Take 10,000 mcg by mouth daily. , Disp: , Rfl:    CAPLYTA 42 MG capsule, Take 42 mg by mouth daily., Disp: , Rfl:    cetirizine (ZYRTEC) 10 MG tablet, Take 10 mg by mouth daily., Disp: , Rfl:    cholecalciferol (VITAMIN D) 1000 UNITS tablet, Take 1,000 Units by mouth daily., Disp: , Rfl:    dapagliflozin propanediol (FARXIGA) 10 MG TABS tablet, Take 1 tablet (10 mg total) by mouth daily before breakfast., Disp: 90 tablet, Rfl: 1   docusate sodium (COLACE) 100 MG capsule, Take 100 mg by mouth daily., Disp: , Rfl:    DULoxetine (CYMBALTA) 30 MG capsule, Take 30 mg by mouth 2 (two) times daily., Disp: , Rfl:    gabapentin (NEURONTIN) 300 MG capsule, Take 1 capsule (300 mg total) by mouth 2 (two) times daily., Disp: 180 capsule, Rfl: 1   GAVILAX 17 GM/SCOOP powder, MIX 17 GRAMS INTO 8OZ OF WATER AND DRINK DAILY, Disp: 510 g, Rfl: 0   HYDROcodone-acetaminophen (NORCO/VICODIN) 5-325 MG tablet, Take 1 tablet by mouth daily as needed for moderate pain., Disp: 30 tablet, Rfl: 0   lisinopril (ZESTRIL) 20 MG tablet, TAKE ONE (1) TABLET BY MOUTH EVERY DAY, Disp: 90 tablet, Rfl: 0   omeprazole (PRILOSEC) 20 MG capsule, Take 1 capsule (20 mg total) by mouth daily., Disp: 90 capsule, Rfl:  1   oxymetazoline (AFRIN) 0.05 % nasal spray, Place 1 spray into both nostrils 2 (two) times daily as needed for congestion (over the counter nasal spray)., Disp: , Rfl:    OZEMPIC, 2 MG/DOSE, 8 MG/3ML SOPN, INJECT 2MG INTO THE SKIN ONCE A WEEK, Disp: 3 mL, Rfl: 0   rosuvastatin (CRESTOR) 10 MG tablet, TAKE ONE (1) TABLET EACH DAY, Disp: 90 tablet, Rfl: 0   TRUEplus Lancets 30G MISC, 1 Device by Does not apply route 4 (four) times daily as needed., Disp: 100 each, Rfl: 3  No Known Allergies  Past Medical History:  Diagnosis Date   Anemia    Arthritis    Chronic back pain    lumbar 4-5 slip and radicular right leg pain   COVID-19 07/27/2019   Depression    Diabetes mellitus without complication (HCC)    GERD (gastroesophageal reflux disease)    Hyperlipidemia    Hypertension    Neuropathy    Seasonal allergies    Vitamin D deficiency  Wears glasses     Past Surgical History:  Procedure Laterality Date   ARTHOSCOPIC ROTAOR CUFF REPAIR Right 05/18/2020   Procedure: ARTHROSCOPIC ROTATOR CUFF REPAIR;  Surgeon: Mordecai Rasmussen, MD;  Location: [facility];  Service: Orthopedics;  Laterality: Right;   BACK SURGERY  05/2015   lumbar fusion   BICEPT TENODESIS Right 05/18/2020   Procedure: BICEPS TENOTOMY;  Surgeon: Mordecai Rasmussen, MD;  Location: [facility];  Service: Orthopedics;  Laterality: Right;   CESAREAN SECTION     x 1   CHOLECYSTECTOMY N/A 11/13/2015   Procedure: LAPAROSCOPIC CHOLECYSTECTOMY;  Surgeon: Rolm Bookbinder, MD;  Location: [facility];  Service: General;  Laterality: N/A;   DILATION AND CURETTAGE OF UTERUS     HERNIA REPAIR     umbilical hernia repair as a child   TUBAL LIGATION      Family History  Problem Relation Age of Onset   Stroke Mother    Hypertension Mother    Diabetes Mother    Hypertension Father    Diabetes Father    Kidney disease Father    Hyperlipidemia Father    Hypertension Sister    Hyperlipidemia Sister    Hypertension Brother    Hypertension  Brother    Gout Brother    Hypertension Sister     Social History   Socioeconomic History   Marital status: Married    Spouse name: Not on file   Number of children: Not on file   Years of education: Not on file   Highest education level: Not on file  Occupational History   Not on file  Tobacco Use   Smoking status: Never   Smokeless tobacco: Never  Vaping Use   Vaping Use: Never used  Substance and Sexual Activity   Alcohol use: No   Drug use: No   Sexual activity: Yes    Birth control/protection: None  Other Topics Concern   Not on file  Social History Narrative   Not on file   Social Determinants of Health   Financial Resource Strain: Not on file  Food Insecurity: Not on file  Transportation Needs: Not on file  Physical Activity: Not on file  Stress: Not on file  Social Connections: Not on file  Intimate Partner Violence: Not on file      Objective:    BP 124/83   Pulse 87   Temp 98.1 F (36.7 C)   Ht '5\' 4"'  (1.626 m)   Wt 172 lb 3.2 oz (78.1 kg)   LMP  (LMP Unknown)   SpO2 98%   BMI 29.56 kg/m   BP Readings from Last 3 Encounters:  01/30/22 124/83  09/20/21 107/77  08/09/21 116/82   Wt Readings from Last 3 Encounters:  01/30/22 172 lb 3.2 oz (78.1 kg)  09/20/21 172 lb 6.4 oz (78.2 kg)  08/09/21 176 lb 6.4 oz (80 kg)    Physical Exam Vitals reviewed.  Constitutional:      General: She is not in acute distress.    Appearance: Normal appearance. She is overweight. She is not ill-appearing, toxic-appearing or diaphoretic.  HENT:     Head: Normocephalic and atraumatic.     Right Ear: Tympanic membrane, ear canal and external ear normal. There is no impacted cerumen.     Left Ear: Tympanic membrane, ear canal and external ear normal. There is no impacted cerumen.     Nose: Nose normal. No congestion or rhinorrhea.     Mouth/Throat:  Mouth: Mucous membranes are moist.     Pharynx: Oropharynx is clear. No oropharyngeal exudate or posterior  oropharyngeal erythema.  Eyes:     General: No scleral icterus.       Right eye: No discharge.        Left eye: No discharge.     Conjunctiva/sclera: Conjunctivae normal.     Pupils: Pupils are equal, round, and reactive to light.  Cardiovascular:     Rate and Rhythm: Normal rate and regular rhythm.     Heart sounds: Normal heart sounds. No murmur heard.    No friction rub. No gallop.  Pulmonary:     Effort: Pulmonary effort is normal. No respiratory distress.     Breath sounds: Normal breath sounds. No stridor. No wheezing, rhonchi or rales.  Abdominal:     General: Abdomen is flat. Bowel sounds are normal. There is no distension.     Palpations: Abdomen is soft. There is no hepatomegaly, splenomegaly or mass.     Tenderness: There is no abdominal tenderness. There is no guarding or rebound.     Hernia: No hernia is present.  Musculoskeletal:        General: Normal range of motion.     Cervical back: Normal range of motion and neck supple. No rigidity. No muscular tenderness.  Lymphadenopathy:     Cervical: No cervical adenopathy.  Skin:    General: Skin is warm and dry.     Capillary Refill: Capillary refill takes less than 2 seconds.  Neurological:     General: No focal deficit present.     Mental Status: She is alert and oriented to person, place, and time. Mental status is at baseline.  Psychiatric:        Mood and Affect: Mood normal.        Behavior: Behavior normal.        Thought Content: Thought content normal.        Judgment: Judgment normal.     Lab Results  Component Value Date   TSH 1.130 10/24/2020   Lab Results  Component Value Date   WBC 5.4 08/09/2021   HGB 13.8 08/09/2021   HCT 42.7 08/09/2021   MCV 89 08/09/2021   PLT 240 08/09/2021   Lab Results  Component Value Date   NA 146 (H) 08/09/2021   K 4.1 08/09/2021   CO2 27 08/09/2021   GLUCOSE 75 08/09/2021   BUN 9 08/09/2021   CREATININE 0.92 08/09/2021   BILITOT 0.4 08/09/2021   ALKPHOS 91  08/09/2021   AST 20 08/09/2021   ALT 17 08/09/2021   PROT 7.1 08/09/2021   ALBUMIN 4.6 08/09/2021   CALCIUM 10.0 08/09/2021   ANIONGAP 10 05/16/2020   EGFR 74 08/09/2021   Lab Results  Component Value Date   CHOL 135 08/09/2021   Lab Results  Component Value Date   HDL 52 08/09/2021   Lab Results  Component Value Date   LDLCALC 69 08/09/2021   Lab Results  Component Value Date   TRIG 71 08/09/2021   Lab Results  Component Value Date   CHOLHDL 2.6 08/09/2021   Lab Results  Component Value Date   HGBA1C 5.8 (H) 08/09/2021

## 2022-01-30 NOTE — Patient Instructions (Signed)
Go ahead and schedule your mammogram for on/after 04/03/2022.

## 2022-01-31 LAB — CMP14+EGFR
ALT: 19 IU/L (ref 0–32)
AST: 20 IU/L (ref 0–40)
Albumin/Globulin Ratio: 1.7 (ref 1.2–2.2)
Albumin: 4.2 g/dL (ref 3.8–4.9)
Alkaline Phosphatase: 93 IU/L (ref 44–121)
BUN/Creatinine Ratio: 10 (ref 9–23)
BUN: 8 mg/dL (ref 6–24)
Bilirubin Total: 0.4 mg/dL (ref 0.0–1.2)
CO2: 21 mmol/L (ref 20–29)
Calcium: 9.4 mg/dL (ref 8.7–10.2)
Chloride: 108 mmol/L — ABNORMAL HIGH (ref 96–106)
Creatinine, Ser: 0.82 mg/dL (ref 0.57–1.00)
Globulin, Total: 2.5 g/dL (ref 1.5–4.5)
Glucose: 109 mg/dL — ABNORMAL HIGH (ref 70–99)
Potassium: 3.9 mmol/L (ref 3.5–5.2)
Sodium: 146 mmol/L — ABNORMAL HIGH (ref 134–144)
Total Protein: 6.7 g/dL (ref 6.0–8.5)
eGFR: 84 mL/min/{1.73_m2} (ref >59)

## 2022-01-31 LAB — CBC WITH DIFFERENTIAL/PLATELET
Basophils Absolute: 0 10*3/uL (ref 0.0–0.2)
Basos: 1 %
EOS (ABSOLUTE): 0.1 10*3/uL (ref 0.0–0.4)
Eos: 1 %
Hematocrit: 40.9 % (ref 34.0–46.6)
Hemoglobin: 13.3 g/dL (ref 11.1–15.9)
Immature Grans (Abs): 0 10*3/uL (ref 0.0–0.1)
Immature Granulocytes: 0 %
Lymphocytes Absolute: 2 10*3/uL (ref 0.7–3.1)
Lymphs: 36 %
MCH: 29.8 pg (ref 26.6–33.0)
MCHC: 32.5 g/dL (ref 31.5–35.7)
MCV: 92 fL (ref 79–97)
Monocytes Absolute: 0.4 10*3/uL (ref 0.1–0.9)
Monocytes: 7 %
Neutrophils Absolute: 3 10*3/uL (ref 1.4–7.0)
Neutrophils: 55 %
Platelets: 237 10*3/uL (ref 150–450)
RBC: 4.47 x10E6/uL (ref 3.77–5.28)
RDW: 12.6 % (ref 11.7–15.4)
WBC: 5.4 10*3/uL (ref 3.4–10.8)

## 2022-01-31 LAB — LIPID PANEL
Chol/HDL Ratio: 2.3 ratio (ref 0.0–4.4)
Cholesterol, Total: 142 mg/dL (ref 100–199)
HDL: 61 mg/dL (ref >39)
LDL Chol Calc (NIH): 64 mg/dL (ref 0–99)
Triglycerides: 92 mg/dL (ref 0–149)
VLDL Cholesterol Cal: 17 mg/dL (ref 5–40)

## 2022-01-31 LAB — MICROALBUMIN / CREATININE URINE RATIO
Creatinine, Urine: 90.3 mg/dL
Microalb/Creat Ratio: 5 mg/g creat (ref 0–29)
Microalbumin, Urine: 4.6 ug/mL

## 2022-02-05 ENCOUNTER — Other Ambulatory Visit: Payer: Self-pay | Admitting: Family Medicine

## 2022-02-05 DIAGNOSIS — I10 Essential (primary) hypertension: Secondary | ICD-10-CM

## 2022-02-14 ENCOUNTER — Other Ambulatory Visit: Payer: Self-pay | Admitting: Family Medicine

## 2022-02-14 DIAGNOSIS — K219 Gastro-esophageal reflux disease without esophagitis: Secondary | ICD-10-CM

## 2022-02-21 ENCOUNTER — Other Ambulatory Visit: Payer: Self-pay | Admitting: Family Medicine

## 2022-02-21 DIAGNOSIS — E1140 Type 2 diabetes mellitus with diabetic neuropathy, unspecified: Secondary | ICD-10-CM

## 2022-03-14 ENCOUNTER — Ambulatory Visit (INDEPENDENT_AMBULATORY_CARE_PROVIDER_SITE_OTHER): Payer: Medicare Other | Admitting: Nurse Practitioner

## 2022-03-14 ENCOUNTER — Encounter: Payer: Self-pay | Admitting: Nurse Practitioner

## 2022-03-14 ENCOUNTER — Encounter: Payer: Self-pay | Admitting: *Deleted

## 2022-03-14 VITALS — BP 113/78 | HR 78 | Temp 97.3°F | Ht 64.0 in | Wt 178.8 lb

## 2022-03-14 DIAGNOSIS — N3000 Acute cystitis without hematuria: Secondary | ICD-10-CM | POA: Diagnosis not present

## 2022-03-14 DIAGNOSIS — R300 Dysuria: Secondary | ICD-10-CM

## 2022-03-14 LAB — MICROSCOPIC EXAMINATION
RBC, Urine: NONE SEEN /hpf (ref 0–2)
Renal Epithel, UA: NONE SEEN /hpf

## 2022-03-14 LAB — URINALYSIS, ROUTINE W REFLEX MICROSCOPIC
Bilirubin, UA: NEGATIVE
Ketones, UA: NEGATIVE
Leukocytes,UA: NEGATIVE
Nitrite, UA: NEGATIVE
Protein,UA: NEGATIVE
RBC, UA: NEGATIVE
Specific Gravity, UA: 1.015 (ref 1.005–1.030)
Urobilinogen, Ur: 1 mg/dL (ref 0.2–1.0)
pH, UA: 5.5 (ref 5.0–7.5)

## 2022-03-14 MED ORDER — CEPHALEXIN 500 MG PO CAPS
500.0000 mg | ORAL_CAPSULE | Freq: Two times a day (BID) | ORAL | 0 refills | Status: DC
Start: 2022-03-14 — End: 2022-11-15

## 2022-03-14 NOTE — Progress Notes (Signed)
   Subjective:    Patient ID: Tammy Mayo, female    DOB: 06-04-1966, 56 y.o.   MRN: [id number]   Chief Complaint: Dysuria (X 3 days)   Dysuria  This is a new problem. The problem occurs every urination. The problem has been waxing and waning. The quality of the pain is described as burning. The pain is at a severity of 5/10. The pain is moderate. There has been no fever. She is Sexually active. There is No history of pyelonephritis. Associated symptoms include frequency, hesitancy and urgency. Pertinent negatives include no chills or flank pain. She has tried nothing for the symptoms.       Review of Systems  Constitutional:  Negative for chills.  Genitourinary:  Positive for dysuria, frequency, hesitancy and urgency. Negative for flank pain.       Objective:   Physical Exam Constitutional:      Appearance: Normal appearance. She is obese.  Cardiovascular:     Rate and Rhythm: Normal rate and regular rhythm.     Heart sounds: Normal heart sounds.  Pulmonary:     Effort: Pulmonary effort is normal.     Breath sounds: Normal breath sounds.  Abdominal:     General: Bowel sounds are normal.     Palpations: Abdomen is soft.     Tenderness: There is no abdominal tenderness. There is no right CVA tenderness or left CVA tenderness.  Skin:    General: Skin is warm.  Neurological:     General: No focal deficit present.     Mental Status: She is alert and oriented to person, place, and time.  Psychiatric:        Mood and Affect: Mood normal.        Behavior: Behavior normal.    BP 113/78   Pulse 78   Temp (!) 97.3 F (36.3 C) (Temporal)   Ht 5\' 4"  (1.626 m)   Wt 178 lb 12.8 oz (81.1 kg)   LMP  (LMP Unknown)   SpO2 99%   BMI 30.69 kg/m         Assessment & Plan:  in today with chief complaint of Dysuria (X 3 days)   1. Dysuria - Urinalysis, Routine w reflex microscopic  2. Acute cystitis without hematuria Take medication as  prescribe Cotton underwear Take shower not bath Cranberry juice, yogurt Force fluids AZO over the counter X2 days Culture pending RTO prn  - Urine Culture  Meds ordered this encounter  Medications   cephALEXin (KEFLEX) 500 MG capsule    Sig: Take 1 capsule (500 mg total) by mouth 2 (two) times daily.    Dispense:  14 capsule    Refill:  0    Order Specific Question:   Supervising Provider    Answer:   Tammy Mayo A [[id number]]     The above assessment and management plan was discussed with the patient. The patient verbalized understanding of and has agreed to the management plan. Patient is aware to call the clinic if symptoms persist or worsen. Patient is aware when to return to the clinic for a follow-up visit. Patient educated on when it is appropriate to go to the emergency department.   Mary-Margaret Arville Care, FNP

## 2022-03-14 NOTE — Patient Instructions (Signed)
Urinary Tract Infection, Adult A urinary tract infection (UTI) is an infection of any part of the urinary tract. The urinary tract includes: The kidneys. The ureters. The bladder. The urethra. These organs make, store, and get rid of pee (urine) in the body. What are the causes? This infection is caused by germs (bacteria) in your genital area. These germs grow and cause swelling (inflammation) of your urinary tract. What increases the risk? The following factors may make you more likely to develop this condition: Using a small, thin tube (catheter) to drain pee. Not being able to control when you pee or poop (incontinence). Being female. If you are female, these things can increase the risk: Using these methods to prevent pregnancy: A medicine that kills sperm (spermicide). A device that blocks sperm (diaphragm). Having low levels of a female hormone (estrogen). Being pregnant. You are more likely to develop this condition if: You have genes that add to your risk. You are sexually active. You take antibiotic medicines. You have trouble peeing because of: A prostate that is bigger than normal, if you are female. A blockage in the part of your body that drains pee from the bladder. A kidney stone. A nerve condition that affects your bladder. Not getting enough to drink. Not peeing often enough. You have other conditions, such as: Diabetes. A weak disease-fighting system (immune system). Sickle cell disease. Gout. Injury of the spine. What are the signs or symptoms? Symptoms of this condition include: Needing to pee right away. Peeing small amounts often. Pain or burning when peeing. Blood in the pee. Pee that smells bad or not like normal. Trouble peeing. Pee that is cloudy. Fluid coming from the vagina, if you are female. Pain in the belly or lower back. Other symptoms include: Vomiting. Not feeling hungry. Feeling mixed up (confused). This may be the first symptom in  older adults. Being tired and grouchy (irritable). A fever. Watery poop (diarrhea). How is this treated? Taking antibiotic medicine. Taking other medicines. Drinking enough water. In some cases, you may need to see a specialist. Follow these instructions at home:  Medicines Take over-the-counter and prescription medicines only as told by your doctor. If you were prescribed an antibiotic medicine, take it as told by your doctor. Do not stop taking it even if you start to feel better. General instructions Make sure you: Pee until your bladder is empty. Do not hold pee for a long time. Empty your bladder after sex. Wipe from front to back after peeing or pooping if you are a female. Use each tissue one time when you wipe. Drink enough fluid to keep your pee pale yellow. Keep all follow-up visits. Contact a doctor if: You do not get better after 1-2 days. Your symptoms go away and then come back. Get help right away if: You have very bad back pain. You have very bad pain in your lower belly. You have a fever. You have chills. You feeling like you will vomit or you vomit. Summary A urinary tract infection (UTI) is an infection of any part of the urinary tract. This condition is caused by germs in your genital area. There are many risk factors for a UTI. Treatment includes antibiotic medicines. Drink enough fluid to keep your pee pale yellow. This information is not intended to replace advice given to you by your health care provider. Make sure you discuss any questions you have with your health care provider. Document Revised: 02/25/2020 Document Reviewed: 02/25/2020 Elsevier Patient Education    2023 [employer] Inc[employer]  

## 2022-03-17 LAB — URINE CULTURE

## 2022-03-20 ENCOUNTER — Other Ambulatory Visit: Payer: Self-pay | Admitting: Family Medicine

## 2022-04-03 ENCOUNTER — Other Ambulatory Visit: Payer: Self-pay | Admitting: Family Medicine

## 2022-04-03 DIAGNOSIS — E1140 Type 2 diabetes mellitus with diabetic neuropathy, unspecified: Secondary | ICD-10-CM

## 2022-05-08 ENCOUNTER — Other Ambulatory Visit: Payer: Self-pay | Admitting: Family Medicine

## 2022-05-08 DIAGNOSIS — I10 Essential (primary) hypertension: Secondary | ICD-10-CM

## 2022-05-21 ENCOUNTER — Encounter: Payer: Self-pay | Admitting: Nurse Practitioner

## 2022-05-21 ENCOUNTER — Ambulatory Visit (INDEPENDENT_AMBULATORY_CARE_PROVIDER_SITE_OTHER): Payer: Medicare Other | Admitting: Nurse Practitioner

## 2022-05-21 VITALS — BP 108/74 | HR 85 | Temp 98.9°F | Ht 64.0 in | Wt 185.0 lb

## 2022-05-21 DIAGNOSIS — Z23 Encounter for immunization: Secondary | ICD-10-CM | POA: Diagnosis not present

## 2022-05-21 DIAGNOSIS — R300 Dysuria: Secondary | ICD-10-CM | POA: Diagnosis not present

## 2022-05-21 LAB — URINALYSIS, ROUTINE W REFLEX MICROSCOPIC
Bilirubin, UA: NEGATIVE
Ketones, UA: NEGATIVE
Nitrite, UA: NEGATIVE
Specific Gravity, UA: 1.02 (ref 1.005–1.030)
Urobilinogen, Ur: >8 mg/dL — ABNORMAL HIGH (ref 0.2–1.0)
pH, UA: 6 (ref 5.0–7.5)

## 2022-05-21 LAB — MICROSCOPIC EXAMINATION
RBC, Urine: NONE SEEN /hpf (ref 0–2)
Renal Epithel, UA: NONE SEEN /hpf

## 2022-05-21 MED ORDER — NITROFURANTOIN MONOHYD MACRO 100 MG PO CAPS: 100.0000 mg | ORAL_CAPSULE | Freq: Two times a day (BID) | ORAL | 0 refills | Status: DC

## 2022-05-21 NOTE — Progress Notes (Signed)
   Acute Office Visit  Subjective:     Patient ID: Tammy Mayo, female    DOB: [birth year], 55 y.o.   MRN: [id number]  Chief Complaint  Patient presents with   Dysuria    Burning, frequency x 4-5 days    Dysuria  This is a new problem. The current episode started yesterday. The problem occurs every urination. The problem has been gradually worsening. The quality of the pain is described as aching and burning. The pain is moderate. There has been no fever. Associated symptoms include hematuria. Pertinent negatives include no chills, flank pain, frequency, hesitancy, nausea or urgency. She has tried nothing for the symptoms.    Review of Systems  Constitutional:  Negative for chills.  Respiratory: Negative.    Gastrointestinal:  Negative for nausea.  Genitourinary:  Positive for dysuria and hematuria. Negative for flank pain, frequency, hesitancy and urgency.  Skin: Negative.  Negative for rash.  All other systems reviewed and are negative.       Objective:    BP 108/74   Pulse 85   Temp 98.9 F (37.2 C)   Ht 5\' 4"  (1.626 m)   Wt 185 lb (83.9 kg)   LMP  (LMP Unknown)   SpO2 97%   BMI 31.76 kg/m    Physical Exam Vitals and nursing note reviewed.  Constitutional:      Appearance: She is obese.  HENT:     Head: Normocephalic.     Right Ear: External ear normal.     Left Ear: External ear normal.     Nose: Nose normal.     Mouth/Throat:     Pharynx: Oropharynx is clear.  Cardiovascular:     Rate and Rhythm: Normal rate and regular rhythm.     Pulses: Normal pulses.     Heart sounds: Normal heart sounds.  Pulmonary:     Effort: Pulmonary effort is normal.     Breath sounds: Normal breath sounds.  Abdominal:     General: Bowel sounds are normal.     Tenderness: There is abdominal tenderness. There is no right CVA tenderness or left CVA tenderness.     Comments: Lower abdomen  Skin:    General: Skin is warm.     Findings: No erythema or rash.   Neurological:     Mental Status: She is alert and oriented to person, place, and time.     No results found for any visits on 05/21/22.      Assessment & Plan:  Patient presents with UTI symptoms of lower abdominal pain, frequency and burning for the past 24 to 48 hours.  Completed urinalysis positive for leukocytes, trace blood, and few bacteria.  UTI  vs  interstitial cystitis -pyridium for pain -Macrobid  Precaution and education provided -All questions answered -follow up with unresolved symptoms  Problem List Items Addressed This Visit   None Visit Diagnoses     Dysuria    -  Primary   Relevant Orders   Urinalysis, Routine w reflex microscopic   Urine Culture   Need for immunization against influenza       Relevant Orders   Flu Vaccine QUAD 92mo+IM (Fluarix, Fluzone & Alfiuria Quad PF) (Completed)       No orders of the defined types were placed in this encounter.   No follow-ups on file.  Ivy Lynn, NP

## 2022-05-21 NOTE — Patient Instructions (Signed)
Urinary Tract Infection, Adult  A urinary tract infection (UTI) is an infection of any part of the urinary tract. The urinary tract includes the kidneys, ureters, bladder, and urethra. These organs make, store, and get rid of urine in the body. An upper UTI affects the ureters and kidneys. A lower UTI affects the bladder and urethra. What are the causes? Most urinary tract infections are caused by bacteria in your genital area around your urethra, where urine leaves your body. These bacteria grow and cause inflammation of your urinary tract. What increases the risk? You are more likely to develop this condition if: You have a urinary catheter that stays in place. You are not able to control when you urinate or have a bowel movement (incontinence). You are female and you: Use a spermicide or diaphragm for birth control. Have low estrogen levels. Are pregnant. You have certain genes that increase your risk. You are sexually active. You take antibiotic medicines. You have a condition that causes your flow of urine to slow down, such as: An enlarged prostate, if you are female. Blockage in your urethra. A kidney stone. A nerve condition that affects your bladder control (neurogenic bladder). Not getting enough to drink, or not urinating often. You have certain medical conditions, such as: Diabetes. A weak disease-fighting system (immunesystem). Sickle cell disease. Gout. Spinal cord injury. What are the signs or symptoms? Symptoms of this condition include: Needing to urinate right away (urgency). Frequent urination. This may include small amounts of urine each time you urinate. Pain or burning with urination. Blood in the urine. Urine that smells bad or unusual. Trouble urinating. Cloudy urine. Vaginal discharge, if you are female. Pain in the abdomen or the lower back. You may also have: Vomiting or a decreased appetite. Confusion. Irritability or tiredness. A fever or  chills. Diarrhea. The first symptom in older adults may be confusion. In some cases, they may not have any symptoms until the infection has worsened. How is this diagnosed? This condition is diagnosed based on your medical history and a physical exam. You may also have other tests, including: Urine tests. Blood tests. Tests for STIs (sexually transmitted infections). If you have had more than one UTI, a cystoscopy or imaging studies may be done to determine the cause of the infections. How is this treated? Treatment for this condition includes: Antibiotic medicine. Over-the-counter medicines to treat discomfort. Drinking enough water to stay hydrated. If you have frequent infections or have other conditions such as a kidney stone, you may need to see a health care provider who specializes in the urinary tract (urologist). In rare cases, urinary tract infections can cause sepsis. Sepsis is a life-threatening condition that occurs when the body responds to an infection. Sepsis is treated in the hospital with IV antibiotics, fluids, and other medicines. Follow these instructions at home:  Medicines Take over-the-counter and prescription medicines only as told by your health care provider. If you were prescribed an antibiotic medicine, take it as told by your health care provider. Do not stop using the antibiotic even if you start to feel better. General instructions Make sure you: Empty your bladder often and completely. Do not hold urine for long periods of time. Empty your bladder after sex. Wipe from front to back after urinating or having a bowel movement if you are female. Use each tissue only one time when you wipe. Drink enough fluid to keep your urine pale yellow. Keep all follow-up visits. This is important. Contact a health   care provider if: Your symptoms do not get better after 1-2 days. Your symptoms go away and then return. Get help right away if: You have severe pain in  your back or your lower abdomen. You have a fever or chills. You have nausea or vomiting. Summary A urinary tract infection (UTI) is an infection of any part of the urinary tract, which includes the kidneys, ureters, bladder, and urethra. Most urinary tract infections are caused by bacteria in your genital area. Treatment for this condition often includes antibiotic medicines. If you were prescribed an antibiotic medicine, take it as told by your health care provider. Do not stop using the antibiotic even if you start to feel better. Keep all follow-up visits. This is important. This information is not intended to replace advice given to you by your health care provider. Make sure you discuss any questions you have with your health care provider. Document Revised: 02/25/2020 Document Reviewed: 02/25/2020 Elsevier Patient Education  2023 Elsevier Inc.  

## 2022-05-22 ENCOUNTER — Ambulatory Visit: Payer: Medicare Other

## 2022-05-24 ENCOUNTER — Other Ambulatory Visit: Payer: Self-pay | Admitting: Nurse Practitioner

## 2022-05-24 LAB — URINE CULTURE

## 2022-06-18 ENCOUNTER — Ambulatory Visit (INDEPENDENT_AMBULATORY_CARE_PROVIDER_SITE_OTHER): Payer: Medicare Other

## 2022-06-18 DIAGNOSIS — Z23 Encounter for immunization: Secondary | ICD-10-CM | POA: Diagnosis not present

## 2022-07-25 ENCOUNTER — Other Ambulatory Visit: Payer: Self-pay | Admitting: Family Medicine

## 2022-08-02 ENCOUNTER — Encounter: Payer: Self-pay | Admitting: Nurse Practitioner

## 2022-08-02 ENCOUNTER — Ambulatory Visit (INDEPENDENT_AMBULATORY_CARE_PROVIDER_SITE_OTHER): Payer: BC Managed Care – PPO | Admitting: Nurse Practitioner

## 2022-08-02 ENCOUNTER — Ambulatory Visit: Payer: Medicare Other | Admitting: Family Medicine

## 2022-08-02 VITALS — BP 119/81 | HR 81 | Ht 64.0 in | Wt 196.0 lb

## 2022-08-02 DIAGNOSIS — Z0000 Encounter for general adult medical examination without abnormal findings: Secondary | ICD-10-CM

## 2022-08-02 DIAGNOSIS — Z794 Long term (current) use of insulin: Secondary | ICD-10-CM | POA: Diagnosis not present

## 2022-08-02 DIAGNOSIS — I10 Essential (primary) hypertension: Secondary | ICD-10-CM

## 2022-08-02 DIAGNOSIS — K219 Gastro-esophageal reflux disease without esophagitis: Secondary | ICD-10-CM

## 2022-08-02 DIAGNOSIS — E1140 Type 2 diabetes mellitus with diabetic neuropathy, unspecified: Secondary | ICD-10-CM | POA: Diagnosis not present

## 2022-08-02 LAB — BAYER DCA HB A1C WAIVED: HB A1C (BAYER DCA - WAIVED): 6.7 % — ABNORMAL HIGH (ref 4.8–5.6)

## 2022-08-02 NOTE — Assessment & Plan Note (Signed)
Continue omeprazole as prescribed, no changes to current regimen.

## 2022-08-02 NOTE — Progress Notes (Signed)
Established Patient Office Visit  Subjective   Patient ID: Tammy Mayo, female    DOB: [birth year]  Age: 57 y.o. MRN: [id number]  Chief Complaint  Patient presents with   Medical Management of Chronic Issues    HPI  Pt presents for follow up of hypertension. Patient was diagnosed in . The patient is tolerating the medication well without side effects. Compliance with treatment has been good; including taking medication as directed , maintains a healthy diet and regular exercise regimen , and following up as directed.  The patient presents with history of type 2 diabetes mellitus without complications. Patient was diagnosed in 11/22/2019. Compliance with treatment has been good; the patient takes medication as directed , maintains a diabetic diet and an exercise regimen , follows up as directed , and is keeping a glucose diary. Patient did not bring a glucose log. Patient specifically denies associated symptoms, including blurred vision, fatigue, polydipsia, polyphagia and polyuria . Patient denies hypoglycemia.   GERD, Follow up:  The patient was last seen for GERD 10 months ago. Changes made since that visit include no changes made to current medication. .  She reports good compliance with treatment. She is not having side effects. .  She IS experiencing  no new symptoms . She is NOT experiencing belching, belching and eructation, bilious reflux, chest pain, deep pressure at base of neck, or difficulty swallowing   Patient Active Problem List   Diagnosis Date Noted   Gastroesophageal reflux disease 05/11/2021   Recurrent moderate major depressive disorder with anxiety ([place]) 01/28/2021   Chronic bilateral low back pain without sciatica 10/24/2020   Controlled substance agreement signed 10/24/2020   Neuropathy    S/P arthroscopy of right shoulder 05/26/2020   Chronic right shoulder pain 11/22/2019   Constipation 11/22/2019   Type 2 diabetes mellitus with diabetic  neuropathy, with long-term current use of insulin ([place]) 11/22/2019   Essential hypertension 10/23/2010   Vitamin D deficiency 10/23/2010   Hyperlipemia 10/23/2010   Past Medical History:  Diagnosis Date   Anemia    Arthritis    Chronic back pain    lumbar 4-5 slip and radicular right leg pain   COVID-19 07/27/2019   Depression    Diabetes mellitus without complication (HCC)    GERD (gastroesophageal reflux disease)    Hyperlipidemia    Hypertension    Neuropathy    Seasonal allergies    Vitamin D deficiency    Wears glasses    Past Surgical History:  Procedure Laterality Date   ARTHOSCOPIC ROTAOR CUFF REPAIR Right 05/18/2020   Procedure: ARTHROSCOPIC ROTATOR CUFF REPAIR;  Surgeon: Mordecai Rasmussen, MD;  Location: [facility];  Service: Orthopedics;  Laterality: Right;   BACK SURGERY  05/2015   lumbar fusion   BICEPT TENODESIS Right 05/18/2020   Procedure: BICEPS TENOTOMY;  Surgeon: Mordecai Rasmussen, MD;  Location: [facility];  Service: Orthopedics;  Laterality: Right;   CESAREAN SECTION     x 1   CHOLECYSTECTOMY N/A 11/13/2015   Procedure: LAPAROSCOPIC CHOLECYSTECTOMY;  Surgeon: Rolm Bookbinder, MD;  Location: [place];  Service: General;  Laterality: N/A;   DILATION AND CURETTAGE OF UTERUS     HERNIA REPAIR     umbilical hernia repair as a child   TUBAL LIGATION     Social History   Tobacco Use   Smoking status: Never   Smokeless tobacco: Never  Vaping Use   Vaping Use: Never used  Substance Use Topics   Alcohol  use: No   Drug use: No   Social History   Socioeconomic History   Marital status: Married    Spouse name: Not on file   Number of children: Not on file   Years of education: Not on file   Highest education level: Not on file  Occupational History   Not on file  Tobacco Use   Smoking status: Never   Smokeless tobacco: Never  Vaping Use   Vaping Use: Never used  Substance and Sexual Activity   Alcohol use: No   Drug use: No   Sexual activity: Yes    Birth  control/protection: None  Other Topics Concern   Not on file  Social History Narrative   Not on file   Social Determinants of Health   Financial Resource Strain: Not on file  Food Insecurity: Not on file  Transportation Needs: Not on file  Physical Activity: Not on file  Stress: Not on file  Social Connections: Not on file  Intimate Partner Violence: Not on file   Family Status  Relation Name Status   Mother  Deceased   Father  Alive   Sister  [place]   Daughter  Alive   MGM  Deceased   MGF  Deceased   PGM  Deceased   PGF  Deceased   Brother  Alive   Sister  Alive   Daughter  Alive   Family History  Problem Relation Age of Onset   Stroke Mother    Hypertension Mother    Diabetes Mother    Hypertension Father    Diabetes Father    Kidney disease Father    Hyperlipidemia Father    Hypertension Sister    Hyperlipidemia Sister    Hypertension Brother    Hypertension Brother    Gout Brother    Hypertension Sister    No Known Allergies    Review of Systems  Constitutional: Negative.   HENT: Negative.    Respiratory: Negative.    Cardiovascular: Negative.   Gastrointestinal: Negative.   Genitourinary: Negative.   Musculoskeletal: Negative.   Skin: Negative.   Neurological: Negative.   All other systems reviewed and are negative.     Objective:     BP 119/81   Pulse 81   Ht 5\' 4"  (1.626 m)   Wt 196 lb (88.9 kg)   LMP  (LMP Unknown)   SpO2 99%   BMI 33.64 kg/m  BP Readings from Last 3 Encounters:  08/02/22 119/81  05/21/22 108/74  03/14/22 113/78   Wt Readings from Last 3 Encounters:  08/02/22 196 lb (88.9 kg)  05/21/22 185 lb (83.9 kg)  03/14/22 178 lb 12.8 oz (81.1 kg)      Physical Exam Vitals and nursing note reviewed.  Constitutional:      Appearance: Normal appearance. She is obese.  HENT:     Right Ear: External ear normal.     Left Ear: External ear normal.     Nose: Nose normal.     Mouth/Throat:     Mouth:  Mucous membranes are moist.     Pharynx: Oropharynx is clear.  Eyes:     Conjunctiva/sclera: Conjunctivae normal.  Cardiovascular:     Rate and Rhythm: Normal rate and regular rhythm.     Pulses: Normal pulses.     Heart sounds: Normal heart sounds.  Pulmonary:     Effort: Pulmonary effort is normal.     Breath sounds: Normal breath sounds.  Abdominal:     General: Bowel sounds are normal.  Skin:    General: Skin is warm.     Findings: No erythema.  Neurological:     General: No focal deficit present.     Mental Status: She is alert and oriented to person, place, and time.  Psychiatric:        Behavior: Behavior normal.      Results for orders placed or performed in visit on 08/02/22  Bayer DCA Hb A1c Waived  Result Value Ref Range   HB A1C (BAYER DCA - WAIVED) 6.7 (H) 4.8 - 5.6 %    Last CBC Lab Results  Component Value Date   WBC 5.4 01/30/2022   HGB 13.3 01/30/2022   HCT 40.9 01/30/2022   MCV 92 01/30/2022   MCH 29.8 01/30/2022   RDW 12.6 01/30/2022   PLT 237 [id number]   Last metabolic panel Lab Results  Component Value Date   GLUCOSE 109 (H) 01/30/2022   NA 146 (H) 01/30/2022   K 3.9 01/30/2022   CL 108 (H) 01/30/2022   CO2 21 01/30/2022   BUN 8 01/30/2022   CREATININE 0.82 01/30/2022   EGFR 84 01/30/2022   CALCIUM 9.4 01/30/2022   PROT 6.7 01/30/2022   ALBUMIN 4.2 01/30/2022   LABGLOB 2.5 01/30/2022   AGRATIO 1.7 01/30/2022   BILITOT 0.4 01/30/2022   ALKPHOS 93 01/30/2022   AST 20 01/30/2022   ALT 19 01/30/2022   ANIONGAP 10 05/16/2020   Last lipids Lab Results  Component Value Date   CHOL 142 01/30/2022   HDL 61 01/30/2022   LDLCALC 64 01/30/2022   TRIG 92 01/30/2022   CHOLHDL 2.3 01/30/2022   Last hemoglobin A1c Lab Results  Component Value Date   HGBA1C 6.7 (H) 08/02/2022      The 10-year ASCVD risk score (Arnett DK, et al., 2019) is: 5.9%    Assessment & Plan:   Problem List Items Addressed This Visit        Cardiovascular and Mediastinum   Essential hypertension - Primary    Blood pressure is well controlled on current medication no changes needed, labs completed cbc: cmp, lipid panel.      Relevant Orders   CMP14+EGFR   CBC with Differential/Platelet   Lipid panel     Digestive   Gastroesophageal reflux disease    Continue omeprazole as prescribed, no changes to current regimen.         Endocrine   Type 2 diabetes mellitus with diabetic neuropathy, with long-term current use of insulin ([place])    Labs completed, continue current medication dose, continue to monitor blood glucose levels, continue diabetic diet, follow up as scheduled.       Relevant Orders   Bayer DCA Hb A1c Waived (Completed)   Other Visit Diagnoses     Healthcare maintenance       Relevant Orders   Hepatitis C Antibody       Return if symptoms worsen or fail to improve.    Ivy Lynn, NP

## 2022-08-02 NOTE — Assessment & Plan Note (Signed)
Labs completed, continue current medication dose, continue to monitor blood glucose levels, continue diabetic diet, follow up as scheduled.

## 2022-08-02 NOTE — Assessment & Plan Note (Signed)
Blood pressure is well controlled on current medication no changes needed, labs completed cbc: cmp, lipid panel.

## 2022-08-02 NOTE — Patient Instructions (Signed)
Diabetes Mellitus Basics  Diabetes mellitus, or diabetes, is a long-term (chronic) disease. It occurs when the body does not properly use sugar (glucose) that is released from food after you eat. Diabetes mellitus may be caused by one or both of these problems: Your pancreas does not make enough of a hormone called insulin. Your body does not react in a normal way to the insulin that it makes. Insulin lets glucose enter cells in your body. This gives you energy. If you have diabetes, glucose cannot get into cells. This causes high blood glucose (hyperglycemia). How to treat and manage diabetes You may need to take insulin or other diabetes medicines daily to keep your glucose in balance. If you are prescribed insulin, you will learn how to give yourself insulin by injection. You may need to adjust the amount of insulin you take based on the foods that you eat. You will need to check your blood glucose levels using a glucose monitor as told by your health care provider. The readings can help determine if you have low or high blood glucose. Generally, you should have these blood glucose levels: Before meals (preprandial): 80-130 mg/dL (4.4-7.2 mmol/L). After meals (postprandial): below 180 mg/dL (10 mmol/L). Hemoglobin A1c (HbA1c) level: less than 7%. Your health care provider will set treatment goals for you. Keep all follow-up visits. This is important. Follow these instructions at home: Diabetes medicines Take your diabetes medicines every day as told by your health care provider. List your diabetes medicines here: Name of medicine: ______________________________ Amount (dose): _______________ Time (a.m./p.m.): _______________ Notes: ___________________________________ Name of medicine: ______________________________ Amount (dose): _______________ Time (a.m./p.m.): _______________ Notes: ___________________________________ Name of medicine: ______________________________ Amount (dose):  _______________ Time (a.m./p.m.): _______________ Notes: ___________________________________ Insulin If you use insulin, list the types of insulin you use here: Insulin type: ______________________________ Amount (dose): _______________ Time (a.m./p.m.): _______________Notes: ___________________________________ Insulin type: ______________________________ Amount (dose): _______________ Time (a.m./p.m.): _______________ Notes: ___________________________________ Insulin type: ______________________________ Amount (dose): _______________ Time (a.m./p.m.): _______________ Notes: ___________________________________ Insulin type: ______________________________ Amount (dose): _______________ Time (a.m./p.m.): _______________ Notes: ___________________________________ Insulin type: ______________________________ Amount (dose): _______________ Time (a.m./p.m.): _______________ Notes: ___________________________________ Managing blood glucose  Check your blood glucose levels using a glucose monitor as told by your health care provider. Write down the times that you check your glucose levels here: Time: _______________ Notes: ___________________________________ Time: _______________ Notes: ___________________________________ Time: _______________ Notes: ___________________________________ Time: _______________ Notes: ___________________________________ Time: _______________ Notes: ___________________________________ Time: _______________ Notes: ___________________________________  Low blood glucose Low blood glucose (hypoglycemia) is when glucose is at or below 70 mg/dL (3.9 mmol/L). Symptoms may include: Feeling: Hungry. Sweaty and clammy. Irritable or easily upset. Dizzy. Sleepy. Having: A fast heartbeat. A headache. A change in your vision. Numbness around the mouth, lips, or tongue. Having trouble with: Moving (coordination). Sleeping. Treating low blood glucose To treat low blood  glucose, eat or drink something containing sugar right away. If you can think clearly and swallow safely, follow the 15:15 rule: Take 15 grams of a fast-acting carb (carbohydrate), as told by your health care provider. Some fast-acting carbs are: Glucose tablets: take 3-4 tablets. Hard candy: eat 3-5 pieces. Fruit juice: drink 4 oz (120 mL). Regular (not diet) soda: drink 4-6 oz (120-180 mL). Honey or sugar: eat 1 Tbsp (15 mL). Check your blood glucose levels 15 minutes after you take the carb. If your glucose is still at or below 70 mg/dL (3.9 mmol/L), take 15 grams of a carb again. If your glucose does not go above 70 mg/dL (3.9 mmol/L) after   3 tries, get help right away. After your glucose goes back to normal, eat a meal or a snack within 1 hour. Treating very low blood glucose If your glucose is at or below 54 mg/dL (3 mmol/L), you have very low blood glucose (severe hypoglycemia). This is an emergency. Do not wait to see if the symptoms will go away. Get medical help right away. Call your local emergency services (911 in the [place] Do not drive yourself to the hospital. Questions to ask your health care provider Should I talk with a diabetes educator? What equipment will I need to care for myself at home? What diabetes medicines do I need? When should I take them? How often do I need to check my blood glucose levels? What number can I call if I have questions? When is my follow-up visit? Where can I find a support group for people with diabetes? Where to find more information American Diabetes Association: www.diabetes.org Association of Diabetes Care and Education Specialists: www.diabeteseducator.org Contact a health care provider if: Your blood glucose is at or above 240 mg/dL (13.3 mmol/L) for 2 days in a row. You have been sick or have had a fever for 2 days or more, and you are not getting better. You have any of these problems for more than 6 hours: You cannot eat or  drink. You feel nauseous. You vomit. You have diarrhea. Get help right away if: Your blood glucose is lower than 54 mg/dL (3 mmol/L). You get confused. You have trouble thinking clearly. You have trouble breathing. These symptoms may represent a serious problem that is an emergency. Do not wait to see if the symptoms will go away. Get medical help right away. Call your local emergency services (911 in the [place] Do not drive yourself to the hospital. Summary Diabetes mellitus is a chronic disease that occurs when the body does not properly use sugar (glucose) that is released from food after you eat. Take insulin and diabetes medicines as told. Check your blood glucose every day, as often as told. Keep all follow-up visits. This is important. This information is not intended to replace advice given to you by your health care provider. Make sure you discuss any questions you have with your health care provider. Document Revised: 11/16/2019 Document Reviewed: 11/16/2019 Elsevier Patient Education  2023 Elsevier Inc.  

## 2022-08-03 LAB — CMP14+EGFR
ALT: 25 IU/L (ref 0–32)
AST: 26 IU/L (ref 0–40)
Albumin/Globulin Ratio: 1.7 (ref 1.2–2.2)
Albumin: 4.2 g/dL (ref 3.8–4.9)
Alkaline Phosphatase: 110 IU/L (ref 44–121)
BUN/Creatinine Ratio: 7 — ABNORMAL LOW (ref 9–23)
BUN: 6 mg/dL (ref 6–24)
Bilirubin Total: 0.4 mg/dL (ref 0.0–1.2)
CO2: 27 mmol/L (ref 20–29)
Calcium: 9.4 mg/dL (ref 8.7–10.2)
Chloride: 107 mmol/L — ABNORMAL HIGH (ref 96–106)
Creatinine, Ser: 0.83 mg/dL (ref 0.57–1.00)
Globulin, Total: 2.5 g/dL (ref 1.5–4.5)
Glucose: 71 mg/dL (ref 70–99)
Potassium: 4 mmol/L (ref 3.5–5.2)
Sodium: 145 mmol/L — ABNORMAL HIGH (ref 134–144)
Total Protein: 6.7 g/dL (ref 6.0–8.5)
eGFR: 83 mL/min/{1.73_m2} (ref >59)

## 2022-08-03 LAB — LIPID PANEL
Chol/HDL Ratio: 2.5 ratio (ref 0.0–4.4)
Cholesterol, Total: 152 mg/dL (ref 100–199)
HDL: 60 mg/dL (ref >39)
LDL Chol Calc (NIH): 70 mg/dL (ref 0–99)
Triglycerides: 126 mg/dL (ref 0–149)
VLDL Cholesterol Cal: 22 mg/dL (ref 5–40)

## 2022-08-03 LAB — CBC WITH DIFFERENTIAL/PLATELET
Basophils Absolute: 0 10*3/uL (ref 0.0–0.2)
Basos: 1 %
EOS (ABSOLUTE): 0 10*3/uL (ref 0.0–0.4)
Eos: 1 %
Hematocrit: 41.7 % (ref 34.0–46.6)
Hemoglobin: 13.5 g/dL (ref 11.1–15.9)
Immature Grans (Abs): 0 10*3/uL (ref 0.0–0.1)
Immature Granulocytes: 1 %
Lymphocytes Absolute: 2.1 10*3/uL (ref 0.7–3.1)
Lymphs: 38 %
MCH: 29.3 pg (ref 26.6–33.0)
MCHC: 32.4 g/dL (ref 31.5–35.7)
MCV: 91 fL (ref 79–97)
Monocytes Absolute: 0.5 10*3/uL (ref 0.1–0.9)
Monocytes: 8 %
Neutrophils Absolute: 2.8 10*3/uL (ref 1.4–7.0)
Neutrophils: 51 %
Platelets: 260 10*3/uL (ref 150–450)
RBC: 4.6 x10E6/uL (ref 3.77–5.28)
RDW: 13.1 % (ref 11.7–15.4)
WBC: 5.5 10*3/uL (ref 3.4–10.8)

## 2022-08-03 LAB — HEPATITIS C ANTIBODY: Hep C Virus Ab: NONREACTIVE

## 2022-08-06 ENCOUNTER — Telehealth: Payer: Self-pay

## 2022-08-06 NOTE — Telephone Encounter (Signed)
Tammy Mayo (Key: [id number]) Ozempic (1 MG/DOSE) 4MG /3ML pen-injectors   Form Blue Building control surveyor Form (CB) Created 9 days ago Sent to Plan 4 minutes ago Plan Response 4 minutes ago Submit Clinical Questions less than a minute ago Determination Favorable less than a minute ago Message from Plan Effective from 08/06/2022 through 08/05/2023.

## 2022-08-08 ENCOUNTER — Other Ambulatory Visit: Payer: Self-pay | Admitting: Nurse Practitioner

## 2022-08-08 ENCOUNTER — Telehealth: Payer: Self-pay

## 2022-08-08 DIAGNOSIS — I10 Essential (primary) hypertension: Secondary | ICD-10-CM

## 2022-08-08 NOTE — Telephone Encounter (Signed)
Tammy Mayo (Key: [id number]) Ozempic (2 MG/DOSE) 8MG /3ML pen-injectors Form Blue Building control surveyor Form (CB) Created 11 days ago Sent to Plan 1 hour ago Plan Response 1 hour ago Submit Clinical Questions less than a minute ago Determination Favorable less than a minute ago Message from Plan Effective from 08/08/2022 through 08/07/2023.

## 2022-08-12 NOTE — Telephone Encounter (Signed)
Tammy Mayo (Key: [id number]) Ozempic (2 MG/DOSE) 8MG /3ML pen-injectors Form Blue Building control surveyor Form (CB) Created 15 days ago Sent to Plan 4 days ago Plan Response 4 days ago Submit Clinical Questions 4 days ago Determination Favorable 4 days ago Message from Plan Effective from 08/08/2022 through 08/07/2023.

## 2022-08-15 ENCOUNTER — Other Ambulatory Visit: Payer: Self-pay | Admitting: Family Medicine

## 2022-08-15 DIAGNOSIS — K219 Gastro-esophageal reflux disease without esophagitis: Secondary | ICD-10-CM

## 2022-08-15 DIAGNOSIS — E1140 Type 2 diabetes mellitus with diabetic neuropathy, unspecified: Secondary | ICD-10-CM

## 2022-08-22 ENCOUNTER — Encounter: Payer: Self-pay | Admitting: Family Medicine

## 2022-09-26 ENCOUNTER — Encounter: Payer: Self-pay | Admitting: Radiology

## 2022-10-01 LAB — HM DIABETES EYE EXAM

## 2022-10-17 ENCOUNTER — Other Ambulatory Visit: Payer: Self-pay | Admitting: Family Medicine

## 2022-11-01 ENCOUNTER — Ambulatory Visit: Payer: Medicare Other | Admitting: Family Medicine

## 2022-11-01 ENCOUNTER — Ambulatory Visit: Payer: BC Managed Care – PPO | Admitting: Family Medicine

## 2022-11-04 ENCOUNTER — Other Ambulatory Visit: Payer: Self-pay | Admitting: Family Medicine

## 2022-11-04 DIAGNOSIS — Z794 Long term (current) use of insulin: Secondary | ICD-10-CM

## 2022-11-07 ENCOUNTER — Other Ambulatory Visit: Payer: Self-pay | Admitting: Family Medicine

## 2022-11-07 DIAGNOSIS — E1140 Type 2 diabetes mellitus with diabetic neuropathy, unspecified: Secondary | ICD-10-CM

## 2022-11-07 DIAGNOSIS — I10 Essential (primary) hypertension: Secondary | ICD-10-CM

## 2022-11-14 ENCOUNTER — Other Ambulatory Visit: Payer: Self-pay | Admitting: Family Medicine

## 2022-11-14 DIAGNOSIS — K219 Gastro-esophageal reflux disease without esophagitis: Secondary | ICD-10-CM

## 2022-11-14 DIAGNOSIS — G629 Polyneuropathy, unspecified: Secondary | ICD-10-CM

## 2022-11-14 DIAGNOSIS — G8929 Other chronic pain: Secondary | ICD-10-CM

## 2022-11-15 ENCOUNTER — Encounter: Payer: Self-pay | Admitting: Family Medicine

## 2022-11-15 ENCOUNTER — Ambulatory Visit (INDEPENDENT_AMBULATORY_CARE_PROVIDER_SITE_OTHER): Payer: BC Managed Care – PPO | Admitting: Family Medicine

## 2022-11-15 VITALS — BP 108/66 | HR 85 | Temp 97.5°F | Ht 64.0 in | Wt 204.1 lb

## 2022-11-15 DIAGNOSIS — E1140 Type 2 diabetes mellitus with diabetic neuropathy, unspecified: Secondary | ICD-10-CM

## 2022-11-15 DIAGNOSIS — E1159 Type 2 diabetes mellitus with other circulatory complications: Secondary | ICD-10-CM

## 2022-11-15 DIAGNOSIS — F419 Anxiety disorder, unspecified: Secondary | ICD-10-CM

## 2022-11-15 DIAGNOSIS — G8929 Other chronic pain: Secondary | ICD-10-CM

## 2022-11-15 DIAGNOSIS — Z79899 Other long term (current) drug therapy: Secondary | ICD-10-CM

## 2022-11-15 DIAGNOSIS — Z794 Long term (current) use of insulin: Secondary | ICD-10-CM

## 2022-11-15 DIAGNOSIS — E1169 Type 2 diabetes mellitus with other specified complication: Secondary | ICD-10-CM

## 2022-11-15 DIAGNOSIS — E785 Hyperlipidemia, unspecified: Secondary | ICD-10-CM

## 2022-11-15 DIAGNOSIS — Z7985 Long-term (current) use of injectable non-insulin antidiabetic drugs: Secondary | ICD-10-CM

## 2022-11-15 DIAGNOSIS — K219 Gastro-esophageal reflux disease without esophagitis: Secondary | ICD-10-CM

## 2022-11-15 DIAGNOSIS — I152 Hypertension secondary to endocrine disorders: Secondary | ICD-10-CM

## 2022-11-15 DIAGNOSIS — F331 Major depressive disorder, recurrent, moderate: Secondary | ICD-10-CM

## 2022-11-15 DIAGNOSIS — M545 Low back pain, unspecified: Secondary | ICD-10-CM

## 2022-11-15 LAB — BAYER DCA HB A1C WAIVED: HB A1C (BAYER DCA - WAIVED): 7.2 % — ABNORMAL HIGH (ref 4.8–5.6)

## 2022-11-15 MED ORDER — HYDROCODONE-ACETAMINOPHEN 5-325 MG PO TABS
1.0000 | ORAL_TABLET | Freq: Every day | ORAL | 0 refills | Status: DC | PRN
Start: 2022-11-15 — End: 2023-02-17

## 2022-11-15 NOTE — Progress Notes (Signed)
Established Patient Office Visit  Subjective   Patient ID: Tammy Mayo, female    DOB: [birth year]  Age: 57 y.o. MRN: [id number]  Chief Complaint  Patient presents with   Medical Management of Chronic Issues   Diabetes   Hyperlipidemia   Hypertension   Gastroesophageal Reflux    Diabetes  Hyperlipidemia  Hypertension  Gastroesophageal Reflux    DM Pt presents for follow up evaluation of Type 2 diabetes mellitus.  Current symptoms include paresthesia of the feet. Patient denies foot ulcerations, increased appetite, nausea, polydipsia, polyuria, visual disturbances, vomiting, and weight loss.  Current diabetic medications include farxiga 10 mg, ozempic 2 mgg Compliant with meds - Yes  Current monitoring regimen: home blood tests - 4 times daily Home blood sugar records:  100-140 Any episodes of hypoglycemia? no  Current diet:  reports that she has been eating more sweets recently Current exercise: none  Is She on ACE inhibitor or angiotensin II receptor blocker?  Yes, lisinopril Is She on statin? Yes crestor  2. HTN Complaint with meds - Yes Current Medications - lisinopril Pertinent ROS:  Headache - No Fatigue - No Visual Disturbances - No Chest pain - No Dyspnea - No Palpitations - No LE edema - No  3. HLD She is on crestor.   4. GERD Compliant with medications - Yes Current medications - prilosec 20 mg  Cough - No Sore throat - No Voice change - No Hemoptysis - No Dysphagia or dyspepsia - occasional dyspepsia Water brash - No Red Flags (weight loss, hematochezia, melena, weight loss, early satiety, fevers, odynophagia, or persistent vomiting) - No  5. Depression Managed by [facility]. Reports well controlled.   6. Pain Cause of pain- lumbar fusion at L4-L5 with screws and a spacer Pain location- low back Pain on scale of 1-10- 2/10 with medication; 6/10 without medication Frequency- daily What increases pain- house chores and bending,  standing for long periods What makes pain better- Biofreeze, rest, lidocaine patches, tylenol Effects on ADL - makes difficult Any change in general medical condition- no   Current opioids rx- Norco 5/325 - patient is has been taking as needed which is about twice weekly at most # meds rx- 30 (which is lasting 3 months at a time) Effectiveness of current meds- good Adverse reactions from pain meds- none Morphine equivalent- 5 MME/day   Pill count performed-No Last drug screen - 05/11/2021 ( high risk q93m, moderate risk q64m, low risk yearly ) Urine drug screen today- Yes Was the NCCSR reviewed- Yes             If yes were their any concerning findings? - No   Overdose risk: 260   Opioid Risk  12/17/2019  Alcohol 3  Illegal Drugs 0  Rx Drugs 0  Alcohol 0  Illegal Drugs 0  Rx Drugs 0  Age between 16-45 years  0  History of Preadolescent Sexual Abuse 0  Psychological Disease 0  Depression 1  Opioid Risk Tool Scoring 4  Opioid Risk Interpretation Moderate Risk     Past Medical History:  Diagnosis Date   Anemia    Arthritis    Chronic back pain    lumbar 4-5 slip and radicular right leg pain   COVID-19 07/27/2019   Depression    Diabetes mellitus without complication    GERD (gastroesophageal reflux disease)    Hyperlipidemia    Hypertension    Neuropathy    Seasonal allergies    Vitamin D deficiency  Wears glasses       ROS    Objective:     BP 108/66   Pulse 85   Temp (!) 97.5 F (36.4 C) (Temporal)   Ht  (1.626 m)   Wt 204 lb 2 oz (92.6 kg)   LMP  (LMP Unknown)   SpO2 98%   BMI 35.04 kg/m    Physical Exam Vitals and nursing note reviewed.  Constitutional:      General: She is not in acute distress.    Appearance: Normal appearance. She is not ill-appearing, toxic-appearing or diaphoretic.  HENT:     Head: Normocephalic and atraumatic.  Neck:     Thyroid: No thyroid mass, thyromegaly or thyroid tenderness.  Cardiovascular:     Rate  and Rhythm: Normal rate and regular rhythm.     Heart sounds: Normal heart sounds. No murmur heard. Pulmonary:     Effort: Pulmonary effort is normal.     Breath sounds: Normal breath sounds.  Abdominal:     General: Bowel sounds are normal. There is no distension.     Palpations: Abdomen is soft.     Tenderness: There is no abdominal tenderness. There is no guarding or rebound.  Musculoskeletal:     Cervical back: Neck supple. No rigidity.     Right lower leg: No edema.     Left lower leg: No edema.  Skin:    General: Skin is warm and dry.  Neurological:     General: No focal deficit present.     Mental Status: She is alert and oriented to person, place, and time.  Psychiatric:        Mood and Affect: Mood normal.        Behavior: Behavior normal.      No results found for any visits on 11/15/22.    The 10-year ASCVD risk score (Arnett DK, et al., 2019) is: 4.6%    Assessment & Plan:   Tammy Mayo was seen today for medical management of chronic issues, diabetes, hyperlipidemia, hypertension and gastroesophageal reflux.  Diagnoses and all orders for this visit:  Type 2 diabetes mellitus with diabetic neuropathy, with long-term current use of insulin A1c 7.2 today, not at goal of <7. Medication changes today: none. She will work on diet. She is on an ACE/ARB and statin. Eye exam: UTD, records requested. Foot exam: today. Urine micro: UTD. Diet and exercise.  -     Bayer DCA Hb A1c Waived  Hypertension associated with type 2 diabetes mellitus Well controlled on current regimen.   Hyperlipidemia associated with type 2 diabetes mellitus Last LDL 70. Well controlled on current regimen.   Gastroesophageal reflux disease without esophagitis Well controlled on current regimen.   Recurrent moderate major depressive disorder with anxiety Managed by [facility].   Chronic bilateral low back pain without sciatica Controlled substance agreement signed PDMP reviewed, no red flags. 30  tablets typically lasts her 3 months. She has not had a fill since 05/11/21. Refill provided today. CSA signed today and UDS obtained today.  -     ToxASSURE Select 13 (MW), Urine -     HYDROcodone-acetaminophen (NORCO/VICODIN) 5-325 MG tablet; Take 1 tablet by mouth daily as needed for moderate pain.  Return in about 3 months (around 02/14/2023) for chronic follow up.   The patient indicates understanding of these issues and agrees with the plan.  Gabriel Earing, FNP

## 2022-11-18 ENCOUNTER — Ambulatory Visit (INDEPENDENT_AMBULATORY_CARE_PROVIDER_SITE_OTHER): Payer: BC Managed Care – PPO | Admitting: Family Medicine

## 2022-11-18 ENCOUNTER — Encounter: Payer: Self-pay | Admitting: Family Medicine

## 2022-11-18 VITALS — BP 107/66 | HR 93 | Temp 98.1°F | Ht 64.0 in | Wt 206.0 lb

## 2022-11-18 DIAGNOSIS — Z0000 Encounter for general adult medical examination without abnormal findings: Secondary | ICD-10-CM | POA: Diagnosis not present

## 2022-11-18 DIAGNOSIS — R5383 Other fatigue: Secondary | ICD-10-CM

## 2022-11-18 NOTE — Progress Notes (Signed)
Subjective:    Tammy Mayo is a 57 y.o. female who presents for a Welcome to Medicare exam.   Review of Systems Review of Systems  Constitutional:  Negative for chills, fever, malaise/fatigue (chronic decreased energy) and weight loss.  HENT:  Negative for hearing loss, sinus pain and sore throat.   Eyes:  Negative for blurred vision, double vision and pain.  Respiratory:  Negative for sputum production, shortness of breath and wheezing.   Cardiovascular:  Negative for chest pain, palpitations, orthopnea, claudication, leg swelling and PND.  Gastrointestinal:  Negative for abdominal pain, blood in stool, melena, nausea and vomiting.  Genitourinary:  Negative for dysuria.  Musculoskeletal:  Negative for falls.  Neurological:  Negative for dizziness, sensory change, speech change, focal weakness, seizures, loss of consciousness, weakness and headaches.  Psychiatric/Behavioral:  Positive for depression. Negative for memory loss and suicidal ideas. The patient is nervous/anxious.     Cardiac Risk Factors include: diabetes mellitus;dyslipidemia;hypertension;obesity (BMI >30kg/m2)      Objective:    Today's Vitals   11/18/22 1402  BP: 107/66  Pulse: 93  Temp: 98.1 F (36.7 C)  TempSrc: Temporal  SpO2: 95%  Weight: 206 lb (93.4 kg)  Height: 5\' 4"  (1.626 m)  Body mass index is 35.36 kg/m.  Medications Outpatient Encounter Medications as of 11/18/2022  Medication Sig   ARIPiprazole (ABILIFY) 30 MG tablet Take 30 mg by mouth daily.   Biotin 16109 MCG TABS Take 10,000 mcg by mouth daily.    CAPLYTA 42 MG capsule Take 42 mg by mouth daily.   cetirizine (ZYRTEC) 10 MG tablet Take 10 mg by mouth daily.   cholecalciferol (VITAMIN D) 1000 UNITS tablet Take 1,000 Units by mouth daily.   dapagliflozin propanediol (FARXIGA) 10 MG TABS tablet TAKE 1 TABLET BY MOUTH EVERY MORNING BEFORE BREAKFAST   docusate sodium (COLACE) 100 MG capsule Take 100 mg by mouth daily.   DULoxetine  (CYMBALTA) 30 MG capsule Take 30 mg by mouth 2 (two) times daily.   gabapentin (NEURONTIN) 300 MG capsule TAKE ONE (1) CAPSULE BY MOUTH 2 TIMES DAILY   GAVILAX 17 GM/SCOOP powder MIX 17 GRAMS INTO 8OZ OF WATER AND DRINK DAILY   HYDROcodone-acetaminophen (NORCO/VICODIN) 5-325 MG tablet Take 1 tablet by mouth daily as needed for moderate pain.   lisinopril (ZESTRIL) 20 MG tablet TAKE ONE (1) TABLET BY MOUTH EVERY DAY   omeprazole (PRILOSEC) 20 MG capsule TAKE ONE CAPSULE BY MOUTH DAILY   OZEMPIC, 2 MG/DOSE, 8 MG/3ML SOPN INJECT 2MG  INTO THE SKIN ONCE A WEEK   rosuvastatin (CRESTOR) 10 MG tablet TAKE ONE (1) TABLET EACH DAY   TRUEplus Lancets 30G MISC Test BS up to 4 times daily dx E11.40   No facility-administered encounter medications on file as of 11/18/2022.     History: Past Medical History:  Diagnosis Date   Anemia    Arthritis    Chronic back pain    lumbar 4-5 slip and radicular right leg pain   COVID-19 07/27/2019   Depression    Diabetes mellitus without complication    GERD (gastroesophageal reflux disease)    Hyperlipidemia    Hypertension    Neuropathy    Seasonal allergies    Vitamin D deficiency    Wears glasses    Past Surgical History:  Procedure Laterality Date   ARTHOSCOPIC ROTAOR CUFF REPAIR Right 05/18/2020   Procedure: ARTHROSCOPIC ROTATOR CUFF REPAIR;  Surgeon: Oliver Barre, MD;  Location: [facility];  Service: Orthopedics;  Laterality: Right;   BACK SURGERY  05/2015   lumbar fusion   BICEPT TENODESIS Right 05/18/2020   Procedure: BICEPS TENOTOMY;  Surgeon: Oliver Barre, MD;  Location: [facility];  Service: Orthopedics;  Laterality: Right;   CESAREAN SECTION     x 1   CHOLECYSTECTOMY N/A 11/13/2015   Procedure: LAPAROSCOPIC CHOLECYSTECTOMY;  Surgeon: Emelia Loron, MD;  Location: [facility];  Service: General;  Laterality: N/A;   DILATION AND CURETTAGE OF UTERUS     HERNIA REPAIR     umbilical hernia repair as a child   TUBAL LIGATION      Family History   Problem Relation Age of Onset   Stroke Mother    Hypertension Mother    Diabetes Mother    Hypertension Father    Diabetes Father    Kidney disease Father    Hyperlipidemia Father    Hypertension Sister    Hyperlipidemia Sister    Hypertension Sister    Hypertension Brother    Hypertension Brother    Gout Brother    Social History   Occupational History   Not on file  Tobacco Use   Smoking status: Never   Smokeless tobacco: Never  Vaping Use   Vaping Use: Never used  Substance and Sexual Activity   Alcohol use: No   Drug use: No   Sexual activity: Yes    Birth control/protection: None    Tobacco Counseling Counseling given: Not Answered   Immunizations and Health Maintenance Immunization History  Administered Date(s) Administered   Influenza,inj,Quad PF,6+ Mos 05/19/2014, 05/05/2020, 05/11/2021, 06/18/2022   Moderna Sars-Covid-2 Vaccination 10/13/2019, 11/08/2019, 06/27/2020   PNEUMOCOCCAL CONJUGATE-20 08/09/2021   Pneumococcal Polysaccharide-23 07/25/2020   Td 03/14/2007   Tdap 03/14/2007, 11/17/2016   Zoster Recombinat (Shingrix) 05/11/2021, 09/20/2021   Health Maintenance Due  Topic Date Due   OPHTHALMOLOGY EXAM  09/24/2022    Activities of Daily Living    11/18/2022    2:10 PM  In your present state of health, do you have any difficulty performing the following activities:  Hearing? 0  Vision? 0  Difficulty concentrating or making decisions? 0  Walking or climbing stairs? 0  Dressing or bathing? 0  Doing errands, shopping? 0  Preparing Food and eating ? N  Using the Toilet? N  In the past six months, have you accidently leaked urine? N  Do you have problems with loss of bowel control? N  Managing your Medications? N  Managing your Finances? N  Housekeeping or managing your Housekeeping? Y  Comment husband helps with sweeping and mopping as these chores hurt her back    Physical Exam  (optional), or other factors deemed appropriate based on  the beneficiary's medical and social history and current clinical standards.  Advanced Directives: Does Patient Have a Medical Advance Directive?: No Would patient like information on creating a medical advance directive?: No - Patient declined    Assessment:    This is a routine wellness examination for this patient .    Vision/Hearing screen No results found. She had an eye exam last month. Report has been requested.   Dietary issues and exercise activities discussed:  Current Exercise Habits: The patient does not participate in regular exercise at present   Goals      DIET - REDUCE SUGAR INTAKE      Depression Screen    11/18/2022    2:30 PM 11/15/2022   11:08 AM 08/02/2022    1:52 PM 05/21/2022  1:45 PM  PHQ 2/9 Scores  PHQ - 2 Score PHQ- 9 Score Fall Risk    11/18/2022    2:30 PM  Fall Risk   Falls in the past year? 0    Cognitive Function:    11/18/2022    2:13 PM  MMSE - Mini Mental State Exam  Orientation to time 4  Orientation to Place 5  Registration 3  Attention/ Calculation 4  Recall 3  Language- name 2 objects 2  Language- repeat 1  Language- follow 3 step command 3  Language- read & follow direction 1  Write a sentence 1  Copy design 0  Total score 27        Patient Care Team: Gabriel Earing, FNP as PCP - General (Family Medicine) Delora Fuel, OD (Optometry)     Plan:   Tammy Mayo was seen today for medicare wellness.  Diagnoses and all orders for this visit:  Welcome to Medicare preventive visit EKG NS- no change from previous -     EKG 12-Lead  Encounter for Medicare annual wellness exam  Decreased energy She will discussed this with BH as likely SE of medications.   I have personally reviewed and noted the following in the patient's chart:   Medical and social history Use of alcohol, tobacco or illicit drugs  Current medications and supplements Functional ability and status Nutritional  status Physical activity Advanced directives List of other physicians Hospitalizations, surgeries, and ER visits in previous 12 months Vitals Screenings to include cognitive, depression, and falls Referrals and appointments  In addition, I have reviewed and discussed with patient certain preventive protocols, quality metrics, and best practice recommendations. A written personalized care plan for preventive services as well as general preventive health recommendations were provided to patient.     Gabriel Earing, FNP 11/18/2022

## 2022-11-19 ENCOUNTER — Telehealth: Payer: Self-pay

## 2022-11-19 NOTE — Telephone Encounter (Signed)
ALAN RILES (KeyCharlsie Merles) Rx #: [id number] Need Help? Call us at [phone] Status sent iconSent to Plan today Drug HYDROcodone-Acetaminophen 5-325MG  tablets ePA cloud logo Form [employer] [facility] [place] [employer] Electronic Request Form

## 2022-11-21 LAB — TOXASSURE SELECT 13 (MW), URINE

## 2022-12-03 NOTE — Telephone Encounter (Signed)
Hydrocodone denied  Tammy Mayo (KeyCharlsie Merles) PA Case ID #: [id number] Rx #: [phone] Need Help? Call us at [phone] Outcome Denied on April 25 Denied Drug HYDROcodone-Acetaminophen 5-325MG  tablets ePA cloud logo Form [employer] Form

## 2022-12-03 NOTE — Telephone Encounter (Signed)
PA was denied on 04/25. Provider may appeal if appropriate

## 2022-12-04 NOTE — Telephone Encounter (Signed)
Patient picked up prescription.

## 2022-12-17 ENCOUNTER — Other Ambulatory Visit: Payer: Self-pay | Admitting: Family Medicine

## 2023-01-02 ENCOUNTER — Other Ambulatory Visit: Payer: Self-pay | Admitting: Family Medicine

## 2023-01-02 DIAGNOSIS — E1140 Type 2 diabetes mellitus with diabetic neuropathy, unspecified: Secondary | ICD-10-CM

## 2023-01-15 ENCOUNTER — Other Ambulatory Visit: Payer: Self-pay | Admitting: Family Medicine

## 2023-02-03 ENCOUNTER — Other Ambulatory Visit: Payer: Self-pay | Admitting: Family Medicine

## 2023-02-03 DIAGNOSIS — I10 Essential (primary) hypertension: Secondary | ICD-10-CM

## 2023-02-06 ENCOUNTER — Other Ambulatory Visit: Payer: Self-pay | Admitting: Family Medicine

## 2023-02-06 DIAGNOSIS — Z794 Long term (current) use of insulin: Secondary | ICD-10-CM

## 2023-02-13 ENCOUNTER — Other Ambulatory Visit: Payer: Self-pay | Admitting: Family Medicine

## 2023-02-13 DIAGNOSIS — K219 Gastro-esophageal reflux disease without esophagitis: Secondary | ICD-10-CM

## 2023-02-17 ENCOUNTER — Ambulatory Visit (INDEPENDENT_AMBULATORY_CARE_PROVIDER_SITE_OTHER): Payer: Medicare Other | Admitting: Family Medicine

## 2023-02-17 ENCOUNTER — Encounter: Payer: Self-pay | Admitting: Family Medicine

## 2023-02-17 VITALS — BP 93/66 | HR 77 | Temp 98.3°F | Ht 64.0 in | Wt 207.0 lb

## 2023-02-17 DIAGNOSIS — E1169 Type 2 diabetes mellitus with other specified complication: Secondary | ICD-10-CM | POA: Diagnosis not present

## 2023-02-17 DIAGNOSIS — E1159 Type 2 diabetes mellitus with other circulatory complications: Secondary | ICD-10-CM | POA: Diagnosis not present

## 2023-02-17 DIAGNOSIS — Z79899 Other long term (current) drug therapy: Secondary | ICD-10-CM

## 2023-02-17 DIAGNOSIS — M545 Low back pain, unspecified: Secondary | ICD-10-CM

## 2023-02-17 DIAGNOSIS — Z794 Long term (current) use of insulin: Secondary | ICD-10-CM

## 2023-02-17 DIAGNOSIS — E1140 Type 2 diabetes mellitus with diabetic neuropathy, unspecified: Secondary | ICD-10-CM | POA: Diagnosis not present

## 2023-02-17 DIAGNOSIS — G8929 Other chronic pain: Secondary | ICD-10-CM

## 2023-02-17 DIAGNOSIS — E785 Hyperlipidemia, unspecified: Secondary | ICD-10-CM

## 2023-02-17 DIAGNOSIS — I152 Hypertension secondary to endocrine disorders: Secondary | ICD-10-CM

## 2023-02-17 DIAGNOSIS — Z7984 Long term (current) use of oral hypoglycemic drugs: Secondary | ICD-10-CM

## 2023-02-17 LAB — BAYER DCA HB A1C WAIVED: HB A1C (BAYER DCA - WAIVED): 7 % — ABNORMAL HIGH (ref 4.8–5.6)

## 2023-02-17 MED ORDER — LISINOPRIL 10 MG PO TABS
10.0000 mg | ORAL_TABLET | Freq: Every day | ORAL | 3 refills | Status: DC
Start: 2023-02-17 — End: 2024-05-12

## 2023-02-17 MED ORDER — HYDROCODONE-ACETAMINOPHEN 5-325 MG PO TABS
1.0000 | ORAL_TABLET | Freq: Every day | ORAL | 0 refills | Status: DC | PRN
Start: 2023-02-17 — End: 2023-05-23

## 2023-02-17 MED ORDER — GLIPIZIDE 5 MG PO TABS: 5.0000 mg | ORAL_TABLET | Freq: Two times a day (BID) | ORAL | 3 refills | Status: DC

## 2023-02-17 NOTE — Progress Notes (Signed)
Established Patient Office Visit  Subjective   Patient ID: Tammy Mayo, female    DOB: 1965-11-13  Age: 57 y.o. MRN: [id number]  Chief Complaint  Patient presents with   Medical Management of Chronic Issues    3 month follow up labwork    HPI  DM Pt presents for follow up evaluation of Type 2 diabetes mellitus.  Current symptoms include paresthesia of the feet. Patient denies foot ulcerations, increased appetite, nausea, polydipsia, polyuria, visual disturbances, vomiting, and weight loss.  Current diabetic medications include farxiga 10 mg, ozempic 2 mgg Compliant with meds - Yes  Current monitoring regimen: home blood tests - 4 times daily Home blood sugar records:  100-150 Any episodes of hypoglycemia? no  Current diet:  reports that she has been eating more sweets recently Current exercise: none  Is She on ACE inhibitor or angiotensin II receptor blocker?  Yes, lisinopril Is She on statin? Yes crestor  2. HTN Complaint with meds - Yes Current Medications - lisinopril Pertinent ROS:  Headache - No Fatigue - yes, she has been tired lately.  Visual Disturbances - No Chest pain - No Dyspnea - No Palpitations - No LE edema - No  Denies dizziness or lightheadedness.   3. HLD She is on crestor. Diet "not too good". No exercise.   4. GERD Compliant with medications - Yes Current medications - prilosec 20 mg  Cough - No Sore throat - No Voice change - No Hemoptysis - No Dysphagia or dyspepsia - occasional dyspepsia Water brash - No Red Flags (weight loss, hematochezia, melena, weight loss, early satiety, fevers, odynophagia, or persistent vomiting) - No  5. Depression Managed by [facility]. Reports fairly controlled. She has been seeing Dr. Karleen Hampshire. She has been getting out more.   6. Pain Cause of pain- lumbar fusion at L4-L5 with screws and a spacer Pain location- low back Pain on scale of 1-10- 2/10 with medication; 6/10 without medication Frequency-  daily What increases pain- house chores and bending, standing for long periods What makes pain better- Biofreeze, rest, lidocaine patches, tylenol Effects on ADL - makes difficult Any change in general medical condition- no   Current opioids rx- Norco 5/325 - patient is has been taking as needed which is about twice weekly at most # meds rx- 30 (which is lasting 3 months at a time) Effectiveness of current meds- good Adverse reactions from pain meds- none Morphine equivalent- 5 MME/day   Pill count performed-No Last drug screen - 11/15/22 ( high risk q52m, moderate risk q78m, low risk yearly ) Urine drug screen today- Yes Was the NCCSR reviewed- Yes             If yes were their any concerning findings? - No   Overdose risk: 260   Opioid Risk  12/17/2019  Alcohol 3  Illegal Drugs 0  Rx Drugs 0  Alcohol 0  Illegal Drugs 0  Rx Drugs 0  Age between 16-45 years  0  History of Preadolescent Sexual Abuse 0  Psychological Disease 0  Depression 1  Opioid Risk Tool Scoring 4  Opioid Risk Interpretation Moderate Risk     Past Medical History:  Diagnosis Date   Anemia    Arthritis    Chronic back pain    lumbar 4-5 slip and radicular right leg pain   COVID-19 07/27/2019   Depression    Diabetes mellitus without complication (HCC)    GERD (gastroesophageal reflux disease)    Hyperlipidemia  Hypertension    Neuropathy    Seasonal allergies    Vitamin D deficiency    Wears glasses       ROS As per HPI.    Objective:     BP 93/66   Pulse 77   Temp 98.3 F (36.8 C)   Ht 5\' 4"  (1.626 m)   Wt 207 lb (93.9 kg)   LMP  (LMP Unknown)   SpO2 98%   BMI 35.53 kg/m   BP Readings from Last 3 Encounters:  02/17/23 93/66  11/18/22 107/66  11/15/22 108/66     Physical Exam Vitals and nursing note reviewed.  Constitutional:      General: She is not in acute distress.    Appearance: Normal appearance. She is not ill-appearing, toxic-appearing or diaphoretic.  HENT:      Head: Normocephalic and atraumatic.  Neck:     Thyroid: No thyroid mass, thyromegaly or thyroid tenderness.  Cardiovascular:     Rate and Rhythm: Normal rate and regular rhythm.     Heart sounds: Normal heart sounds. No murmur heard. Pulmonary:     Effort: Pulmonary effort is normal.     Breath sounds: Normal breath sounds.  Abdominal:     General: Bowel sounds are normal. There is no distension.     Palpations: Abdomen is soft.     Tenderness: There is no abdominal tenderness. There is no guarding or rebound.  Musculoskeletal:     Cervical back: Neck supple. No rigidity.     Right lower leg: No edema.     Left lower leg: No edema.  Skin:    General: Skin is warm and dry.  Neurological:     General: No focal deficit present.     Mental Status: She is alert and oriented to person, place, and time.  Psychiatric:        Mood and Affect: Mood normal.        Behavior: Behavior normal.      No results found for any visits on 02/17/23.    The 10-year ASCVD risk score (Arnett DK, et al., 2019) is: 3.1%    Assessment & Plan:   Collin was seen today for medical management of chronic issues.  Diagnoses and all orders for this visit:  Type 2 diabetes mellitus with diabetic neuropathy, with long-term current use of insulin (HCC) A1c 7.0 today, not at goal of <7. Medication changes today: add glipizde . She is on an ACE/ARB and statin. Eye exam: records requested. Foot exam: UTD. Urine micro: UTD. Diet and exercise.  -     Bayer DCA Hb A1c Waived -     CMP14+EGFR -     Microalbumin / creatinine urine ratio -     glipiZIDE (GLUCOTROL) 5 MG tablet; Take 1 tablet (5 mg total) by mouth 2 (two) times daily before a meal.  Hypertension associated with type 2 diabetes mellitus (HCC) BP soft today, reports fatigue but denies dizziness. Decrease lisinopril to 10 mg daily. Monitor BP at home and notify for high or low readings.  -     TSH -     lisinopril (ZESTRIL) 10 MG tablet;  Take 1 tablet (10 mg total) by mouth daily. TAKE ONE (1) TABLET BY MOUTH EVERY DAY  Hyperlipidemia associated with type 2 diabetes mellitus (HCC) On statin. Last LDL 70.  Chronic bilateral low back pain without sciatica Controlled substance agreement signed Well controlled on current regimen, she uses very sparingly. PDMP reviewed, no red  flags. CSA and UDS are UTD.  -     HYDROcodone-acetaminophen (NORCO/VICODIN) 5-325 MG tablet; Take 1 tablet by mouth daily as needed for moderate pain.   Return in about 3 months (around 05/20/2023) for chronic follow up.   The patient indicates understanding of these issues and agrees with the plan.  Gabriel Earing, FNP

## 2023-02-18 LAB — CMP14+EGFR
ALT: 18 IU/L (ref 0–32)
AST: 19 IU/L (ref 0–40)
Albumin: 4.2 g/dL (ref 3.8–4.9)
Alkaline Phosphatase: 121 IU/L (ref 44–121)
BUN/Creatinine Ratio: 11 (ref 9–23)
BUN: 9 mg/dL (ref 6–24)
Bilirubin Total: 0.3 mg/dL (ref 0.0–1.2)
CO2: 23 mmol/L (ref 20–29)
Calcium: 9.6 mg/dL (ref 8.7–10.2)
Chloride: 105 mmol/L (ref 96–106)
Creatinine, Ser: 0.82 mg/dL (ref 0.57–1.00)
Globulin, Total: 2.5 g/dL (ref 1.5–4.5)
Glucose: 101 mg/dL — ABNORMAL HIGH (ref 70–99)
Potassium: 3.8 mmol/L (ref 3.5–5.2)
Sodium: 146 mmol/L — ABNORMAL HIGH (ref 134–144)
Total Protein: 6.7 g/dL (ref 6.0–8.5)
eGFR: 84 mL/min/{1.73_m2} (ref >59)

## 2023-02-18 LAB — TSH: TSH: 1.49 u[IU]/mL (ref 0.450–4.500)

## 2023-02-18 LAB — MICROALBUMIN / CREATININE URINE RATIO
Creatinine, Urine: 48.3 mg/dL
Microalb/Creat Ratio: 6 mg/g creat (ref 0–29)
Microalbumin, Urine: 3 ug/mL

## 2023-03-17 ENCOUNTER — Other Ambulatory Visit: Payer: Self-pay | Admitting: Family Medicine

## 2023-03-17 DIAGNOSIS — M545 Low back pain, unspecified: Secondary | ICD-10-CM

## 2023-03-17 DIAGNOSIS — G629 Polyneuropathy, unspecified: Secondary | ICD-10-CM

## 2023-04-01 ENCOUNTER — Other Ambulatory Visit: Payer: Self-pay | Admitting: Family Medicine

## 2023-04-01 DIAGNOSIS — E1140 Type 2 diabetes mellitus with diabetic neuropathy, unspecified: Secondary | ICD-10-CM

## 2023-04-11 LAB — HM MAMMOGRAPHY

## 2023-04-16 ENCOUNTER — Encounter: Payer: Self-pay | Admitting: Family Medicine

## 2023-05-01 ENCOUNTER — Other Ambulatory Visit: Payer: Self-pay | Admitting: Family Medicine

## 2023-05-01 DIAGNOSIS — Z794 Long term (current) use of insulin: Secondary | ICD-10-CM

## 2023-05-01 DIAGNOSIS — E1140 Type 2 diabetes mellitus with diabetic neuropathy, unspecified: Secondary | ICD-10-CM

## 2023-05-09 ENCOUNTER — Encounter: Payer: Self-pay | Admitting: Family Medicine

## 2023-05-13 ENCOUNTER — Ambulatory Visit (INDEPENDENT_AMBULATORY_CARE_PROVIDER_SITE_OTHER): Payer: BC Managed Care – PPO

## 2023-05-13 ENCOUNTER — Encounter: Payer: Self-pay | Admitting: Nurse Practitioner

## 2023-05-13 ENCOUNTER — Ambulatory Visit (INDEPENDENT_AMBULATORY_CARE_PROVIDER_SITE_OTHER): Payer: BC Managed Care – PPO | Admitting: Nurse Practitioner

## 2023-05-13 VITALS — BP 115/77 | HR 64 | Temp 97.3°F | Ht 64.0 in | Wt 215.4 lb

## 2023-05-13 DIAGNOSIS — M25511 Pain in right shoulder: Secondary | ICD-10-CM

## 2023-05-13 DIAGNOSIS — R079 Chest pain, unspecified: Secondary | ICD-10-CM | POA: Diagnosis not present

## 2023-05-13 DIAGNOSIS — G8929 Other chronic pain: Secondary | ICD-10-CM | POA: Diagnosis not present

## 2023-05-13 DIAGNOSIS — Z23 Encounter for immunization: Secondary | ICD-10-CM

## 2023-05-13 DIAGNOSIS — Z9889 Other specified postprocedural states: Secondary | ICD-10-CM | POA: Diagnosis not present

## 2023-05-13 NOTE — Progress Notes (Signed)
Acute Office Visit  Subjective:     Patient ID: Tammy Mayo, female    DOB: Jan 22, 1966, 57 y.o.   MRN: [id number]  Chief Complaint  Patient presents with   pain with breathing    Right side of chest and right side of back hurts when taking deep breath for 2 weeks    Tammy Mayo is a 57 yrs olf female with PMH of chronic right shoulder pain here today 05/13/23 fro an acute visit for  right side back with deep breathing. Reports that 2-weeks started experiencing  back pain with deep breath. Nothing makes better, breathing in and out make is worst Reports 5/10 pain achy pain. On Hydrocodone, but still not helping the pain. First episodes was while she was in bed. Current symptoms is not associated with greasy food and reports that GERD is well controlled with Prilosec.  Denies recent travel or long car ride. Denies cough, fever chills, SOB. X-ray ordered, awaiting for final reports Active Ambulatory Problems    Diagnosis Date Noted   Hypertension associated with type 2 diabetes mellitus (HCC) 10/23/2010   Vitamin D deficiency 10/23/2010   Hyperlipidemia associated with type 2 diabetes mellitus (HCC) 10/23/2010   Chronic right shoulder pain 11/22/2019   Constipation 11/22/2019   Type 2 diabetes mellitus with diabetic neuropathy, with long-term current use of insulin (HCC) 11/22/2019   S/P arthroscopy of right shoulder 05/26/2020   Chronic bilateral low back pain without sciatica 10/24/2020   Controlled substance agreement signed 10/24/2020   Neuropathy    Recurrent moderate major depressive disorder with anxiety (HCC) 01/28/2021   Gastroesophageal reflux disease 05/11/2021   Resolved Ambulatory Problems    Diagnosis Date Noted   Plantar fasciitis 10/23/2010   Back pain 06/21/2015   Past Medical History:  Diagnosis Date   Anemia    Arthritis    Chronic back pain    COVID-19 07/27/2019   Depression    Diabetes mellitus without complication (HCC)    GERD  (gastroesophageal reflux disease)    Hyperlipidemia    Hypertension    Seasonal allergies    Wears glasses      Review of Systems  Constitutional:  Negative for chills and fever.  HENT:  Negative for congestion and sore throat.   Respiratory:  Negative for cough and shortness of breath.   Cardiovascular:  Negative for chest pain and leg swelling.  Gastrointestinal:  Negative for nausea and vomiting.  Genitourinary:  Negative for frequency, hematuria and urgency.  Musculoskeletal:  Positive for back pain.  Skin:  Negative for itching and rash.  Neurological:  Negative for dizziness and headaches.  Endo/Heme/Allergies:  Negative for environmental allergies and polydipsia. Does not bruise/bleed easily.  Psychiatric/Behavioral:  Negative for suicidal ideas. The patient does not have insomnia.    Negative unless indicated in Tammy    Objective:    BP 115/77   Pulse 64   Temp (!) 97.3 F (36.3 C) (Temporal)   Ht 5\' 4"  (1.626 m)   Wt 215 lb 6.4 oz (97.7 kg)   LMP  (LMP Unknown)   SpO2 98%   BMI 36.97 kg/m  BP Readings from Last 3 Encounters:  05/13/23 115/77  02/17/23 93/66  11/18/22 107/66   Wt Readings from Last 3 Encounters:  05/13/23 215 lb 6.4 oz (97.7 kg)  02/17/23 207 lb (93.9 kg)  11/18/22 206 lb (93.4 kg)      Physical Exam Vitals and nursing note reviewed.  Constitutional:  Appearance: Normal appearance. She is obese.  HENT:     Head: Normocephalic and atraumatic.  Eyes:     General: No scleral icterus.    Extraocular Movements: Extraocular movements intact.     Conjunctiva/sclera: Conjunctivae normal.     Pupils: Pupils are equal, round, and reactive to light.  Neck:     Vascular: No carotid bruit.  Cardiovascular:     Rate and Rhythm: Normal rate and regular rhythm.  Pulmonary:     Effort: Pulmonary effort is normal.     Breath sounds: Normal breath sounds. No wheezing.  Musculoskeletal:        General: Normal range of motion.     Cervical  back: Normal and normal range of motion. No rigidity or tenderness.     Thoracic back: Normal. No swelling or edema. Normal range of motion.     Right lower leg: No edema.     Left lower leg: No edema.  Lymphadenopathy:     Cervical: No cervical adenopathy.  Skin:    General: Skin is warm and dry.     Findings: No rash.  Neurological:     Mental Status: She is alert and oriented to person, place, and time. Mental status is at baseline.  Psychiatric:        Mood and Affect: Mood normal.        Behavior: Behavior normal.        Thought Content: Thought content normal.        Judgment: Judgment normal.     No results found for any visits on 05/13/23.      Assessment & Plan:  Chronic right shoulder pain  S/P arthroscopy of right shoulder  Right-sided chest pain -     DG Chest 2 View  Encounter for immunization -     Flu vaccine trivalent PF, 6mos and older(Flulaval,Afluria,Fluarix,Fluzone)   Tammy Mayo is 57 yrs old female seen today for right  sided back pin with deep breathing Possible GERD exacerbation X-ray ordered to r/o muscle tension; awaiting for final reports She is already under the care of pain management  Encourage healthy lifestyle choices, including diet (rich in fruits, vegetables, and lean proteins, and low in salt and simple carbohydrates) and exercise (at least 30 minutes of moderate physical activity daily).   The above assessment and management plan was discussed with the patient. The patient verbalized understanding of and has agreed to the management plan. Patient is aware to call the clinic if they develop any new symptoms or if symptoms persist or worsen. Patient is aware when to return to the clinic for a follow-up visit. Patient educated on when it is appropriate to go to the emergency department.  No follow-ups on file.  Tammy Aran Santa Lighter, DNP [facility] [address] [place], [place] [address] [phone]

## 2023-05-14 ENCOUNTER — Other Ambulatory Visit: Payer: Self-pay | Admitting: Family Medicine

## 2023-05-14 DIAGNOSIS — K219 Gastro-esophageal reflux disease without esophagitis: Secondary | ICD-10-CM

## 2023-05-23 ENCOUNTER — Encounter: Payer: Self-pay | Admitting: Family Medicine

## 2023-05-23 ENCOUNTER — Ambulatory Visit (INDEPENDENT_AMBULATORY_CARE_PROVIDER_SITE_OTHER): Payer: BC Managed Care – PPO | Admitting: Family Medicine

## 2023-05-23 VITALS — BP 102/73 | HR 78 | Temp 97.8°F | Ht 64.0 in | Wt 213.4 lb

## 2023-05-23 DIAGNOSIS — E1169 Type 2 diabetes mellitus with other specified complication: Secondary | ICD-10-CM

## 2023-05-23 DIAGNOSIS — Z7985 Long-term (current) use of injectable non-insulin antidiabetic drugs: Secondary | ICD-10-CM

## 2023-05-23 DIAGNOSIS — E1140 Type 2 diabetes mellitus with diabetic neuropathy, unspecified: Secondary | ICD-10-CM

## 2023-05-23 DIAGNOSIS — Z79899 Other long term (current) drug therapy: Secondary | ICD-10-CM

## 2023-05-23 DIAGNOSIS — M545 Low back pain, unspecified: Secondary | ICD-10-CM

## 2023-05-23 DIAGNOSIS — Z7984 Long term (current) use of oral hypoglycemic drugs: Secondary | ICD-10-CM

## 2023-05-23 DIAGNOSIS — Z794 Long term (current) use of insulin: Secondary | ICD-10-CM

## 2023-05-23 DIAGNOSIS — G8929 Other chronic pain: Secondary | ICD-10-CM

## 2023-05-23 DIAGNOSIS — F331 Major depressive disorder, recurrent, moderate: Secondary | ICD-10-CM | POA: Diagnosis not present

## 2023-05-23 DIAGNOSIS — I152 Hypertension secondary to endocrine disorders: Secondary | ICD-10-CM

## 2023-05-23 DIAGNOSIS — E1159 Type 2 diabetes mellitus with other circulatory complications: Secondary | ICD-10-CM

## 2023-05-23 DIAGNOSIS — R0789 Other chest pain: Secondary | ICD-10-CM

## 2023-05-23 DIAGNOSIS — K219 Gastro-esophageal reflux disease without esophagitis: Secondary | ICD-10-CM

## 2023-05-23 LAB — BAYER DCA HB A1C WAIVED: HB A1C (BAYER DCA - WAIVED): 6.5 % — ABNORMAL HIGH (ref 4.8–5.6)

## 2023-05-23 MED ORDER — HYDROCODONE-ACETAMINOPHEN 5-325 MG PO TABS: 1.0000 | ORAL_TABLET | Freq: Every day | ORAL | 0 refills | Status: DC | PRN

## 2023-05-23 MED ORDER — EMPAGLIFLOZIN 25 MG PO TABS
25.0000 mg | ORAL_TABLET | Freq: Every day | ORAL | 3 refills | Status: DC
Start: 2023-05-23 — End: 2024-04-22

## 2023-05-23 MED ORDER — NAPROXEN 250 MG PO TABS: 500.0000 mg | ORAL_TABLET | Freq: Two times a day (BID) | ORAL | 0 refills | Status: DC

## 2023-05-23 NOTE — Progress Notes (Signed)
Established Patient Office Visit  Subjective   Patient ID: Tammy Mayo, female    DOB: October 19, 1965  Age: 57 y.o. MRN: [id number]  Chief Complaint  Patient presents with   Medical Management of Chronic Issues   Diabetes    HPI  DM Pt presents for follow up evaluation of Type 2 diabetes mellitus.  Current symptoms include paresthesia of the feet. Patient denies foot ulcerations, increased appetite, nausea, polydipsia, polyuria, visual disturbances, vomiting, and weight loss.  Current diabetic medications include farxiga 10 mg, ozempic 2 mg Compliant with meds - Yes   Marcelline Deist is costing $60 a months. No longer able to use co pay card. Would like a less expensive option.   Current monitoring regimen: home blood tests - 4 times daily Home blood sugar records:  100-150 Any episodes of hypoglycemia? no  Current diet:  reports that she has been eating more sweets recently Current exercise: none  Is She on ACE inhibitor or angiotensin II receptor blocker?  Yes, lisinopril Is She on statin? Yes crestor  2. HTN Complaint with meds - Yes Current Medications - lisinopril Pertinent ROS:  Headache - No Fatigue - yes, she has been tired lately.  Visual Disturbances - No Chest pain - No Dyspnea - No Palpitations - No LE edema - No  Denies dizziness or lightheadedness.   3. HLD She is on crestor. Diet "not too good". No exercise.   4. GERD Compliant with medications - Yes Current medications - prilosec 20 mg  Cough - No Sore throat - No Voice change - No Hemoptysis - No Dysphagia or dyspepsia - occasional dyspepsia Water brash - No Red Flags (weight loss, hematochezia, melena, weight loss, early satiety, fevers, odynophagia, or persistent vomiting) - No  5. Depression Managed by [facility]. Stable. Denies SI.   6. Chest pain One spot on her right anterior chest. Can point to location. Sharp pain, worse with deep breath. No injury. Denies dizziness, shortness of breath,  wheezing, edema, cough. Taking gabapentin and hydrocodone without relief.   7. Chronic back Pain Cause of pain- lumbar fusion at L4-L5 with screws and a spacer Pain location- low back Pain on scale of 1-10- 4/10 with medication; 7/10 without medication Frequency- daily What increases pain- house chores and bending, standing for long periods What makes pain better- Biofreeze, rest, lidocaine patches, tylenol Effects on ADL - makes difficult Any change in general medical condition- no   Current opioids rx- Norco 5/325 - patient is has been taking as needed which is about twice weekly at most # meds rx- 30 (which is lasting 3 months at a time) Effectiveness of current meds- good Adverse reactions from pain meds- none Morphine equivalent- 5 MME/day   Pill count performed-No Last drug screen - 11/15/22 ( high risk q37m, moderate risk q79m, low risk yearly ) Urine drug screen today- Yes Was the NCCSR reviewed- Yes             If yes were their any concerning findings? - No   Overdose risk: 260   Opioid Risk  12/17/2019  Alcohol 3  Illegal Drugs 0  Rx Drugs 0  Alcohol 0  Illegal Drugs 0  Rx Drugs 0  Age between 16-45 years  0  History of Preadolescent Sexual Abuse 0  Psychological Disease 0  Depression 1  Opioid Risk Tool Scoring 4  Opioid Risk Interpretation Moderate Risk        05/23/2023    1:00 PM 05/13/2023  12:51 PM 02/17/2023   12:56 PM  Depression screen PHQ 2/9  Decreased Interest 1 1 1   Down, Depressed, Hopeless 2 1 1   PHQ - 2 Score 3 2 2   Altered sleeping 1 2 2   Tired, decreased energy 3 3 3   Change in appetite 2 2 2   Feeling bad or failure about yourself  1 1 1   Trouble concentrating 0 0 0  Moving slowly or fidgety/restless 0 0 1  Suicidal thoughts 0 1 1  PHQ-9 Score 10 11 12   Difficult doing work/chores Somewhat difficult Somewhat difficult Somewhat difficult      05/23/2023    1:00 PM 05/13/2023   12:52 PM 02/17/2023   12:56 PM 11/18/2022    2:32 PM   GAD 7 : Generalized Anxiety Score  Nervous, Anxious, on Edge 1 1 1 1   Control/stop worrying 1 1 1 2   Worry too much - different things 2 2 1 2   Trouble relaxing 1 1 2 1   Restless 1 1 1 1   Easily annoyed or irritable 1 1 1 2   Afraid - awful might happen 2 1 1 2   Total GAD 7 Score 9 8 8 11   Anxiety Difficulty Somewhat difficult Somewhat difficult Somewhat difficult Very difficult     Past Medical History:  Diagnosis Date   Anemia    Arthritis    Chronic back pain    lumbar 4-5 slip and radicular right leg pain   COVID-19 07/27/2019   Depression    Diabetes mellitus without complication (HCC)    GERD (gastroesophageal reflux disease)    Hyperlipidemia    Hypertension    Neuropathy    Seasonal allergies    Vitamin D deficiency    Wears glasses       ROS As per HPI.    Objective:     BP 102/73   Pulse 78   Temp 97.8 F (36.6 C) (Temporal)   Ht 5\' 4"  (1.626 m)   Wt 213 lb 6 oz (96.8 kg)   LMP  (LMP Unknown)   SpO2 97%   BMI 36.63 kg/m   BP Readings from Last 3 Encounters:  05/23/23 102/73  05/13/23 115/77  02/17/23 93/66     Physical Exam Vitals and nursing note reviewed.  Constitutional:      General: She is not in acute distress.    Appearance: Normal appearance. She is not ill-appearing, toxic-appearing or diaphoretic.  HENT:     Head: Normocephalic and atraumatic.  Neck:     Thyroid: No thyroid mass, thyromegaly or thyroid tenderness.  Cardiovascular:     Rate and Rhythm: Normal rate and regular rhythm.     Heart sounds: Normal heart sounds. No murmur heard. Pulmonary:     Effort: Pulmonary effort is normal.     Breath sounds: Normal breath sounds.  Chest:       Comments: Mild tenderness to palpation at location pictured above Abdominal:     General: Bowel sounds are normal. There is no distension.     Palpations: Abdomen is soft.     Tenderness: There is no abdominal tenderness. There is no guarding or rebound.  Musculoskeletal:      Cervical back: Neck supple. No rigidity.     Right lower leg: No edema.     Left lower leg: No edema.  Skin:    General: Skin is warm and dry.  Neurological:     General: No focal deficit present.     Mental Status: She  is alert and oriented to person, place, and time.  Psychiatric:        Mood and Affect: Mood normal.        Behavior: Behavior normal.      No results found for any visits on 05/23/23.    The 10-year ASCVD risk score (Arnett DK, et al., 2019) is: 4.2%    Assessment & Plan:   Adelei was seen today for medical management of chronic issues and diabetes.  Diagnoses and all orders for this visit:  Type 2 diabetes mellitus with diabetic neuropathy, with long-term current use of insulin (HCC) A1c 6.8 today, at goal of <7. Medication changes today: Will switch from farxiga to jardiance for lower cost. Continue other medications. She is on an ACE/ARB and statin. Eye exam: UTD. Foot exam: UTD. Urine micro: UTD. Diet and exercise.  -     Bayer DCA Hb A1c Waived -     empagliflozin (JARDIANCE) 25 MG TABS tablet; Take 1 tablet (25 mg total) by mouth daily before breakfast.  Long term current use of oral hypoglycemic drug  Long-term current use of injectable noninsulin antidiabetic medication  Recurrent moderate major depressive disorder with anxiety (HCC) Managed by [facility]. Stable, Denies SI.   Hypertension associated with type 2 diabetes mellitus (HCC) Well controlled on current regimen.   Hyperlipidemia associated with type 2 diabetes mellitus (HCC) Last LDL at 70. On statin.   Gastroesophageal reflux disease without esophagitis Well controlled on current regimen.   Chronic bilateral low back pain without sciatica Controlled substance agreement signed Well controlled on current regimen. PDMP reviewed, no red flags. UDS and CSA are UTD. Refills provided.  -     HYDROcodone-acetaminophen (NORCO/VICODIN) 5-325 MG tablet; Take 1 tablet by mouth daily as needed for  moderate pain (pain score 4-6).  Chest wall pain Naproxen as below. Heat, rest.  -     naproxen (NAPROSYN) 250 MG tablet; Take 2 tablets (500 mg total) by mouth 2 (two) times daily with a meal.   Return in about 3 months (around 08/23/2023) for chronic follow up.   The patient indicates understanding of these issues and agrees with the plan.  Gabriel Earing, FNP

## 2023-05-29 ENCOUNTER — Telehealth: Payer: Self-pay

## 2023-05-29 NOTE — Telephone Encounter (Signed)
Pharmacy Patient Advocate Encounter  Received notification from [facility] that Prior Authorization for HYDROcodone-Acetaminophen 5-325MG  tablets has been APPROVED from 05/29/23 to 06/28/23   PA #/Case ID/Reference #: [id number]

## 2023-05-29 NOTE — Telephone Encounter (Signed)
Tammy Mayo (Key: [id number]) Rx #: [id number] Need Help? Call us at [phone] Status sent iconSent to Plan today Drug HYDROcodone-Acetaminophen 5-325MG  tablets ePA cloud logo Form [employer] [place] [employer] Electronic Request Form Original Claim Info [id number] LIMIT APPLIES, 7 DAY INITIAL [phone] OR PA REQ

## 2023-06-06 ENCOUNTER — Telehealth: Payer: Self-pay | Admitting: *Deleted

## 2023-06-06 NOTE — Telephone Encounter (Signed)
Opened in error

## 2023-06-10 ENCOUNTER — Ambulatory Visit: Payer: Self-pay | Admitting: Family Medicine

## 2023-06-10 ENCOUNTER — Encounter: Payer: Self-pay | Admitting: Family Medicine

## 2023-06-10 ENCOUNTER — Ambulatory Visit (INDEPENDENT_AMBULATORY_CARE_PROVIDER_SITE_OTHER): Payer: BC Managed Care – PPO | Admitting: Family Medicine

## 2023-06-10 VITALS — BP 100/70 | HR 90 | Temp 98.5°F | Ht 64.0 in | Wt 217.0 lb

## 2023-06-10 DIAGNOSIS — R0789 Other chest pain: Secondary | ICD-10-CM

## 2023-06-10 DIAGNOSIS — F419 Anxiety disorder, unspecified: Secondary | ICD-10-CM | POA: Diagnosis not present

## 2023-06-10 DIAGNOSIS — R079 Chest pain, unspecified: Secondary | ICD-10-CM | POA: Diagnosis not present

## 2023-06-10 DIAGNOSIS — F331 Major depressive disorder, recurrent, moderate: Secondary | ICD-10-CM

## 2023-06-10 MED ORDER — PREDNISONE 20 MG PO TABS: 40.0000 mg | ORAL_TABLET | Freq: Every day | ORAL | 0 refills | Status: AC

## 2023-06-10 NOTE — Progress Notes (Signed)
Subjective:  Patient ID: Tammy Mayo, female    DOB: [birth year], 57 y.o.   MRN: [id number]  Patient Care Team: Gabriel Earing, FNP as PCP - General (Family Medicine) Delora Fuel, OD [facility])   Chief Complaint:  right chest pain (Radiates up towards shoulder and towards shoulder blade on the back)  HPI: Tammy Mayo is a 57 y.o. female presenting on 06/10/2023 for right chest pain (Radiates up towards shoulder and towards shoulder blade on the back) States that pain started 4 weeks ago. Report that it is her her right chest and worse with deep breath. Radiates to her back. Denies history of DVT/PE. Denies diaphoresis or shortness of breath. States that she started muscle relaxer and states that it is not helping. She has not tried heat or ice. States that the pain is constant. She does not work currently. Denies repetitive motions. Denies trauma/injury. States that she had shoulder surgery 2-3 years ago. Describes the pain as aching. Has not tried tylenol or advil states that laying on that side and bathing make it worse. Patient does not recall seeing Martina Sinner at [facility] on 05/13/23. She does recall seeing Harlow Mares at [facility] on 05/23/23 for similar issues.   Relevant past medical, surgical, family, and social history reviewed and updated as indicated.  Allergies and medications reviewed and updated. Data reviewed: Chart in Epic.  Past Medical History:  Diagnosis Date   Anemia    Arthritis    Chronic back pain    lumbar 4-5 slip and radicular right leg pain   COVID-19 07/27/2019   Depression    Diabetes mellitus without complication (HCC)    GERD (gastroesophageal reflux disease)    Hyperlipidemia    Hypertension    Neuropathy    Seasonal allergies    Vitamin D deficiency    Wears glasses     Past Surgical History:  Procedure Laterality Date   ARTHOSCOPIC ROTAOR CUFF REPAIR Right 05/18/2020   Procedure: ARTHROSCOPIC ROTATOR CUFF REPAIR;   Surgeon: Oliver Barre, MD;  Location: [facility];  Service: Orthopedics;  Laterality: Right;   BACK SURGERY  05/2015   lumbar fusion   BICEPT TENODESIS Right 05/18/2020   Procedure: BICEPS TENOTOMY;  Surgeon: Oliver Barre, MD;  Location: [facility];  Service: Orthopedics;  Laterality: Right;   CESAREAN SECTION     x 1   CHOLECYSTECTOMY N/A 11/13/2015   Procedure: LAPAROSCOPIC CHOLECYSTECTOMY;  Surgeon: Emelia Loron, MD;  Location: [facility];  Service: General;  Laterality: N/A;   DILATION AND CURETTAGE OF UTERUS     HERNIA REPAIR     umbilical hernia repair as a child   TUBAL LIGATION      Social History   Socioeconomic History   Marital status: Married    Spouse name: Markham Jordan   Number of children: 2   Years of education: 12   Highest education level: High school graduate  Occupational History   Not on file  Tobacco Use   Smoking status: Never   Smokeless tobacco: Never  Vaping Use   Vaping status: Never Used  Substance and Sexual Activity   Alcohol use: No   Drug use: No   Sexual activity: Yes    Birth control/protection: None  Other Topics Concern   Not on file  Social History Narrative   Not on file   Social Determinants of Health   Financial Resource Strain: Low Risk  (11/18/2022)   Overall Financial Resource  Strain (CARDIA)    Difficulty of Paying Living Expenses: Not hard at all  Food Insecurity: No Food Insecurity (11/18/2022)   Hunger Vital Sign    Worried About Running Out of Food in the Last Year: Never true    Ran Out of Food in the Last Year: Never true  Transportation Needs: No Transportation Needs (11/18/2022)   PRAPARE - Administrator, Civil Service (Medical): No    Lack of Transportation (Non-Medical): No  Physical Activity: Inactive (11/18/2022)   Exercise Vital Sign    Days of Exercise per Week: 0 days    Minutes of Exercise per Session: 0 min  Stress: No Stress Concern Present (11/18/2022)   [employer] of Occupational Health -  Occupational Stress Questionnaire    Feeling of Stress : Only a little  Social Connections: Socially Integrated (11/18/2022)   Social Connection and Isolation Panel [NHANES]    Frequency of Communication with Friends and Family: More than three times a week    Frequency of Social Gatherings with Friends and Family: More than three times a week    Attends Religious Services: More than 4 times per year    Active Member of [employer] or Organizations: Yes    Attends Engineer, structural: More than 4 times per year    Marital Status: Married  Catering manager Violence: Not At Risk (11/18/2022)   Humiliation, Afraid, Rape, and Kick questionnaire    Fear of Current or Ex-Partner: No    Emotionally Abused: No    Physically Abused: No    Sexually Abused: No    Outpatient Encounter Medications as of 06/10/2023  Medication Sig   naproxen (NAPROSYN) 500 MG tablet Take 500 mg by mouth 2 (two) times daily.   ARIPiprazole (ABILIFY) 30 MG tablet Take 30 mg by mouth daily.   Biotin 16109 MCG TABS Take 10,000 mcg by mouth daily.    CAPLYTA 42 MG capsule Take 42 mg by mouth daily.   cetirizine (ZYRTEC) 10 MG tablet Take 10 mg by mouth daily.   cholecalciferol (VITAMIN D) 1000 UNITS tablet Take 1,000 Units by mouth daily.   docusate sodium (COLACE) 100 MG capsule Take 100 mg by mouth daily.   DULoxetine (CYMBALTA) 30 MG capsule Take 30 mg by mouth 2 (two) times daily.   empagliflozin (JARDIANCE) 25 MG TABS tablet Take 1 tablet (25 mg total) by mouth daily before breakfast.   gabapentin (NEURONTIN) 300 MG capsule TAKE ONE (1) CAPSULE BY MOUTH 2 TIMES DAILY   GAVILAX 17 GM/SCOOP powder MIX 17 GRAMS INTO 8OZ OF WATER AND DRINK DAILY   glipiZIDE (GLUCOTROL) 5 MG tablet Take 1 tablet (5 mg total) by mouth 2 (two) times daily before a meal.   HYDROcodone-acetaminophen (NORCO/VICODIN) 5-325 MG tablet Take 1 tablet by mouth daily as needed for moderate pain (pain score 4-6).   lisinopril (ZESTRIL) 10 MG  tablet Take 1 tablet (10 mg total) by mouth daily. TAKE ONE (1) TABLET BY MOUTH EVERY DAY   omeprazole (PRILOSEC) 20 MG capsule TAKE ONE CAPSULE BY MOUTH DAILY   OZEMPIC, 2 MG/DOSE, 8 MG/3ML SOPN INJECT 2MG  INTO THE SKIN ONCE A WEEK   rosuvastatin (CRESTOR) 10 MG tablet TAKE ONE (1) TABLET EACH DAY   TRUEplus Lancets 30G MISC Test BS up to 4 times daily dx E11.40   [DISCONTINUED] naproxen (NAPROSYN) 250 MG tablet Take 2 tablets (500 mg total) by mouth 2 (two) times daily with a meal.   No facility-administered encounter  medications on file as of 06/10/2023.   No Known Allergies  Review of Systems As per HPI  Objective:  BP 100/70   Pulse 90   Temp 98.5 F (36.9 C)   Ht 5\' 4"  (1.626 m)   Wt 217 lb (98.4 kg)   LMP  (LMP Unknown)   SpO2 94%   BMI 37.25 kg/m    Wt Readings from Last 3 Encounters:  06/10/23 217 lb (98.4 kg)  05/23/23 213 lb 6 oz (96.8 kg)  05/13/23 215 lb 6.4 oz (97.7 kg)   Physical Exam Constitutional:      General: She is awake. She is not in acute distress.    Appearance: Normal appearance. She is well-developed and well-groomed. She is not ill-appearing, toxic-appearing or diaphoretic.  Cardiovascular:     Rate and Rhythm: Normal rate and regular rhythm.     Pulses: Normal pulses.          Radial pulses are 2+ on the right side and 2+ on the left side.       Posterior tibial pulses are 2+ on the right side and 2+ on the left side.     Heart sounds: Normal heart sounds. No murmur heard.    No gallop.  Pulmonary:     Effort: Pulmonary effort is normal. No respiratory distress.     Breath sounds: Normal breath sounds. No stridor. No wheezing, rhonchi or rales.  Chest:       Comments: Does not increase in pain with palpation or movement.  Musculoskeletal:     Cervical back: Full passive range of motion without pain and neck supple.     Right lower leg: No edema.     Left lower leg: No edema.     Comments: Full ROM without pain   Skin:    General: Skin  is warm.     Capillary Refill: Capillary refill takes less than 2 seconds.  Neurological:     General: No focal deficit present.     Mental Status: She is alert, oriented to person, place, and time and easily aroused. Mental status is at baseline.     GCS: GCS eye subscore is 4. GCS verbal subscore is 5. GCS motor subscore is 6.     Motor: No weakness.  Psychiatric:        Attention and Perception: Attention and perception normal.        Mood and Affect: Mood is depressed. Affect is blunt and flat.        Speech: Speech is delayed.        Behavior: Behavior is slowed and withdrawn. Behavior is cooperative.        Thought Content: Thought content includes suicidal ideation. Thought content does not include homicidal ideation. Thought content does not include homicidal or suicidal plan.        Cognition and Memory: Cognition and memory normal.        Judgment: Judgment normal.     Results for orders placed or performed in visit on 05/23/23  Bayer DCA Hb A1c Waived  Result Value Ref Range   HB A1C (BAYER DCA - WAIVED) 6.5 (H) 4.8 - 5.6 %      06/10/2023    4:05 PM 05/23/2023    1:00 PM 05/13/2023   12:51 PM 02/17/2023   12:56 PM 11/18/2022    2:30 PM  Depression screen PHQ 2/9  Decreased Interest 1 1 1 1 1   Down, Depressed, Hopeless 1 2 1  1  2  PHQ - 2 Score 2 3 2 2 3   Altered sleeping 2 1 2 2 1   Tired, decreased energy 3 3 3 3 2   Change in appetite 1 2 2 2 1   Feeling bad or failure about yourself  1 1 1 1 2   Trouble concentrating 1 0 0 0 1  Moving slowly or fidgety/restless 1 0 0 1 1  Suicidal thoughts 2 0 1 1 0  PHQ-9 Score 13 10 11 12 11   Difficult doing work/chores  Somewhat difficult Somewhat difficult Somewhat difficult Very difficult       06/10/2023    4:06 PM 05/23/2023    1:00 PM 05/13/2023   12:52 PM 02/17/2023   12:56 PM  GAD 7 : Generalized Anxiety Score  Nervous, Anxious, on Edge 1 1 1 1   Control/stop worrying 1 1 1 1   Worry too much - different things 1 2  2 1   Trouble relaxing 1 1 1 2   Restless 1 1 1 1   Easily annoyed or irritable 2 1 1 1   Afraid - awful might happen  2 1 1   Total GAD 7 Score  9 8 8   Anxiety Difficulty  Somewhat difficult Somewhat difficult Somewhat difficult   Pertinent labs & imaging results that were available during my care of the patient were reviewed by me and considered in my medical decision making.  Assessment & Plan:  Annastin was seen today for right chest pain.  Diagnoses and all orders for this visit:  Chest wall pain Will start medication as below. Reviewed A1C from 05/23/23, well controlled. Patient has completed course of naproxen. Will not repeat at this time. Reviewed CXR from 05/13/23, reviewed mammogram from 04/11/23, both without acute abnormalities.  Patient currently takes norco as well. PDMP reviewed. No red flags. Discussed symptoms of PE with patient and when to report to ED.  -     predniSONE (DELTASONE) 20 MG tablet; Take 2 tablets (40 mg total) by mouth daily with breakfast for 5 days.  Right-sided chest pain EKG without acute abnormalities. Reviewed Well's criteria. Low concern for DVT at this time.  -     EKG 12-Lead -     predniSONE (DELTASONE) 20 MG tablet; Take 2 tablets (40 mg total) by mouth daily with breakfast for 5 days.  Recurrent moderate major depressive disorder with anxiety [facility]) Patient is established with [facility]. Endorses thoughts of self-harm. Denies active plans. Patient to follow up with [facility] and PCP.   Continue all other maintenance medications.  Follow up plan: Return if symptoms worsen or fail to improve.   Continue healthy lifestyle choices, including diet (rich in fruits, vegetables, and lean proteins, and low in salt and simple carbohydrates) and exercise (at least 30 minutes of moderate physical activity daily).  Written and verbal instructions provided   The above assessment and management plan was discussed with the patient. The patient verbalized understanding  of and has agreed to the management plan. Patient is aware to call the clinic if they develop any new symptoms or if symptoms persist or worsen. Patient is aware when to return to the clinic for a follow-up visit. Patient educated on when it is appropriate to go to the emergency department.   Neale Burly, DNP-FNP [facility] [address] [place], [place] [address] [phone]

## 2023-06-10 NOTE — Telephone Encounter (Signed)
Copied from CRM [id number]. Topic: Clinical - Red Word Triage >> Jun 10, 2023  1:24 PM Cassiday T wrote: Red Word that prompted transfer to Nurse Triage: patient is having right side chest and back pain.    Chief Complaint: Right side chest and right side back pain x 3 wks, unrelieved with RX Naproxen Symptoms: Worse when taking deep breath, sts pain last a few seconds Frequency: intermittent, hurts only when taking deep breath Pertinent Negatives: Patient denies difficulty breathing or shortness of breath Disposition: [] ED /[] Urgent Care (no appt availability in office) / [x] Appointment(In office/virtual)/ []  [facility]/ [] Home Care/ [] Refused Recommended Disposition /[] [facility]/ []  Follow-up with PCP Additional Notes: Spoke with Chelsea at [facility], appt made for today at 3:45pm.   Reason for Disposition  [1] Chest pain (or "angina") comes and goes AND [2] is happening more often (increasing in frequency) or getting worse (increasing in severity)  (Exception: Chest pains that last only a few seconds.)  Taking a deep breath makes pain worse  Answer Assessment - Initial Assessment Questions 1. LOCATION: "Where does it hurt?"       Right side of chest and  upper back 2. RADIATION: "Does the pain go anywhere else?" (e.g., into neck, jaw, arms, back)     No 3. ONSET: "When did the chest pain begin?" (Minutes, hours or days)      3 weeks 4. PATTERN: "Does the pain come and go, or has it been constant since it started?"  "Does it get worse with exertion?"      Worse when taking a deep breath 5. DURATION: "How long does it last" (e.g., seconds, minutes, hours)     A few seconds 6. SEVERITY: "How bad is the pain?"  (e.g., Scale 1-10; mild, moderate, or severe)    - MILD (1-3): doesn't interfere with normal activities     - MODERATE (4-7): interferes with normal activities or awakens from sleep    - SEVERE (8-10): excruciating pain, unable to do any normal activities        7- only hurts when taking deep breaths  7. CARDIAC RISK FACTORS: "Do you have any history of heart problems or risk factors for heart disease?" (e.g., angina, prior heart attack; diabetes, high blood pressure, high cholesterol, smoker, or strong family history of heart disease)     High blood pressure, high cholesterol, mother had CHF  8. PULMONARY RISK FACTORS: "Do you have any history of lung disease?"  (e.g., blood clots in lung, asthma, emphysema, birth control pills)     No   9. CAUSE: "What do you think is causing the chest pain?"     Unsure; denies fall or injury  10. OTHER SYMPTOMS: "Do you have any other symptoms?" (e.g., dizziness, nausea, vomiting, sweating, fever, difficulty breathing, cough)       No  11. PREGNANCY: "Is there any chance you are pregnant?" "When was your last menstrual period?"       No  Protocols used: Chest Pain-A-AH

## 2023-06-11 NOTE — Progress Notes (Signed)
No acute abnormalities on EKG. Patient to follow up if symptoms do not improve.

## 2023-06-12 ENCOUNTER — Ambulatory Visit: Payer: BC Managed Care – PPO | Admitting: Family Medicine

## 2023-06-13 ENCOUNTER — Other Ambulatory Visit: Payer: Self-pay | Admitting: Family Medicine

## 2023-06-16 ENCOUNTER — Telehealth: Payer: Self-pay

## 2023-06-16 DIAGNOSIS — G8929 Other chronic pain: Secondary | ICD-10-CM

## 2023-06-16 NOTE — Telephone Encounter (Signed)
Copied from CRM [id number]. Topic: Clinical - Medical Advice >> Jun 16, 2023 12:50 PM [name] wrote: Reason for CRM: Patient called in for advice regarding pain medicine that was just prescribed by Jerrel Ivory last week. Stated that the rx she was given isnt helping and wants to know what next steps to take or if she needs a new rx.

## 2023-06-16 NOTE — Addendum Note (Signed)
Addended by: Hessie Diener on: 06/16/2023 02:10 PM   Modules accepted: Orders

## 2023-06-16 NOTE — Telephone Encounter (Signed)
I spoke to pt and she states the pain is worse with movement of her right shoulder and she would like referral and wants [facility]. Referral placed.

## 2023-06-16 NOTE — Telephone Encounter (Signed)
I can place a referral to ortho for further evaluation. If she is agreeable, does she have a preference for location?

## 2023-07-07 ENCOUNTER — Other Ambulatory Visit: Payer: Self-pay | Admitting: Family Medicine

## 2023-07-07 DIAGNOSIS — G8929 Other chronic pain: Secondary | ICD-10-CM

## 2023-07-07 DIAGNOSIS — Z79899 Other long term (current) drug therapy: Secondary | ICD-10-CM

## 2023-07-28 ENCOUNTER — Other Ambulatory Visit: Payer: Self-pay | Admitting: Family Medicine

## 2023-07-28 DIAGNOSIS — E1140 Type 2 diabetes mellitus with diabetic neuropathy, unspecified: Secondary | ICD-10-CM

## 2023-08-12 ENCOUNTER — Other Ambulatory Visit: Payer: Self-pay | Admitting: Family Medicine

## 2023-08-12 DIAGNOSIS — K219 Gastro-esophageal reflux disease without esophagitis: Secondary | ICD-10-CM

## 2023-08-21 ENCOUNTER — Telehealth: Payer: Self-pay | Admitting: Pharmacy Technician

## 2023-08-21 ENCOUNTER — Other Ambulatory Visit (HOSPITAL_COMMUNITY): Payer: Self-pay

## 2023-08-21 NOTE — Telephone Encounter (Signed)
Pharmacy Patient Advocate Encounter   Received notification from CoverMyMeds that prior authorization for Ozempic (2 MG/DOSE) 8MG /3ML pen-injectors is required/requested.   Insurance verification completed.   The patient is insured through [facility] .   Per test claim: The current 84 day co-pay is, $180.00.  No PA needed at this time. This test claim was processed through [facility]- copay amounts may vary at other pharmacies due to pharmacy/plan contracts, or as the patient moves through the different stages of their insurance plan.

## 2023-08-25 ENCOUNTER — Ambulatory Visit (INDEPENDENT_AMBULATORY_CARE_PROVIDER_SITE_OTHER): Payer: Medicare Other | Admitting: Family Medicine

## 2023-08-25 ENCOUNTER — Encounter: Payer: Self-pay | Admitting: Family Medicine

## 2023-08-25 VITALS — BP 112/79 | HR 87 | Temp 97.9°F | Ht 64.0 in | Wt 214.0 lb

## 2023-08-25 DIAGNOSIS — Z79899 Other long term (current) drug therapy: Secondary | ICD-10-CM

## 2023-08-25 DIAGNOSIS — E785 Hyperlipidemia, unspecified: Secondary | ICD-10-CM

## 2023-08-25 DIAGNOSIS — M545 Low back pain, unspecified: Secondary | ICD-10-CM

## 2023-08-25 DIAGNOSIS — E1169 Type 2 diabetes mellitus with other specified complication: Secondary | ICD-10-CM

## 2023-08-25 DIAGNOSIS — K219 Gastro-esophageal reflux disease without esophagitis: Secondary | ICD-10-CM

## 2023-08-25 DIAGNOSIS — F331 Major depressive disorder, recurrent, moderate: Secondary | ICD-10-CM

## 2023-08-25 DIAGNOSIS — E1159 Type 2 diabetes mellitus with other circulatory complications: Secondary | ICD-10-CM | POA: Diagnosis not present

## 2023-08-25 DIAGNOSIS — E1165 Type 2 diabetes mellitus with hyperglycemia: Secondary | ICD-10-CM

## 2023-08-25 DIAGNOSIS — G629 Polyneuropathy, unspecified: Secondary | ICD-10-CM

## 2023-08-25 DIAGNOSIS — Z7984 Long term (current) use of oral hypoglycemic drugs: Secondary | ICD-10-CM | POA: Insufficient documentation

## 2023-08-25 DIAGNOSIS — I152 Hypertension secondary to endocrine disorders: Secondary | ICD-10-CM

## 2023-08-25 DIAGNOSIS — Z7985 Long-term (current) use of injectable non-insulin antidiabetic drugs: Secondary | ICD-10-CM | POA: Diagnosis not present

## 2023-08-25 DIAGNOSIS — F419 Anxiety disorder, unspecified: Secondary | ICD-10-CM

## 2023-08-25 LAB — BAYER DCA HB A1C WAIVED: HB A1C (BAYER DCA - WAIVED): 6.5 % — ABNORMAL HIGH (ref 4.8–5.6)

## 2023-08-25 LAB — LIPID PANEL

## 2023-08-25 MED ORDER — GABAPENTIN 300 MG PO CAPS: ORAL_CAPSULE | ORAL | 1 refills | Status: DC

## 2023-08-25 MED ORDER — HYDROCODONE-ACETAMINOPHEN 5-325 MG PO TABS: 1.0000 | ORAL_TABLET | Freq: Every day | ORAL | 0 refills | Status: DC | PRN

## 2023-08-25 MED ORDER — ROSUVASTATIN CALCIUM 10 MG PO TABS: ORAL_TABLET | ORAL | 3 refills | Status: AC

## 2023-08-25 MED ORDER — OZEMPIC (2 MG/DOSE) 8 MG/3ML ~~LOC~~ SOPN: PEN_INJECTOR | SUBCUTANEOUS | 3 refills | Status: DC

## 2023-08-25 MED ORDER — OMEPRAZOLE 20 MG PO CPDR: 20.0000 mg | DELAYED_RELEASE_CAPSULE | Freq: Every day | ORAL | 3 refills | Status: AC

## 2023-08-25 NOTE — Assessment & Plan Note (Signed)
Diet, exercise, weight loss.

## 2023-08-25 NOTE — Assessment & Plan Note (Signed)
Well controlled with omeprazole 20 mg.

## 2023-08-25 NOTE — Progress Notes (Signed)
Established Patient Office Visit  Subjective   Patient ID: Tammy Mayo, female    DOB: 09-26-1965  Age: 58 y.o. MRN: [id number]  Chief Complaint  Patient presents with   Medical Management of Chronic Issues    HPI  DM Pt presents for follow up evaluation of Type 2 diabetes mellitus.  Current symptoms include paresthesia of the feet. Patient denies foot ulcerations, increased appetite, nausea, polydipsia, polyuria, visual disturbances, vomiting, and weight loss.  Current diabetic medications include jardiance 10 mg, ozempic 2 mg, glipizide.  Compliant with meds - Yes   Current monitoring regimen: home blood tests - 4 times daily Home blood sugar records:  100-130 fasting Any episodes of hypoglycemia? no  Current diet: "not that good" Current exercise: none  Is She on ACE inhibitor or angiotensin II receptor blocker?  Yes, lisinopril Is She on statin? Yes crestor  2. HTN Complaint with meds - Yes Current Medications - lisinopril Pertinent ROS:  Headache - No Fatigue - yes, baseline Visual Disturbances - No Chest pain - No Dyspnea - No Palpitations - No LE edema - No  Denies dizziness or lightheadedness.   3. HLD She is on crestor. Diet "not too good". No exercise.   4. GERD Compliant with medications - Yes Current medications - prilosec 20 mg  Cough - No Sore throat - No Voice change - No Hemoptysis - No Dysphagia or dyspepsia - No Water brash - No Red Flags (weight loss, hematochezia, melena, weight loss, early satiety, fevers, odynophagia, or persistent vomiting) - No  5. Depression/anxiety Managed by [facility]. Stable. Denies SI.   7. Chronic back Pain Cause of pain- lumbar fusion at L4-L5 with screws and a spacer Pain location- low back Pain on scale of 1-10- 4-5/10 with medication; 7/10 without medication Frequency- daily What increases pain- house chores and bending, standing for long periods What makes pain better- Biofreeze, rest, lidocaine  patches, tylenol Effects on ADL - makes difficult Any change in general medical condition- no   Current opioids rx- Norco 5/325 - patient is has been taking as needed which is about twice weekly at most # meds rx- 30 (which is lasting 3 months at a time) Effectiveness of current meds- good Adverse reactions from pain meds- none Morphine equivalent- 5 MME/day   Pill count performed-No Last drug screen - 11/15/22 ( high risk q67m, moderate risk q19m, low risk yearly ) Urine drug screen today- Yes Was the NCCSR reviewed- Yes             If yes were their any concerning findings? - No   Overdose risk: 260   Opioid Risk  12/17/2019  Alcohol 3  Illegal Drugs 0  Rx Drugs 0  Alcohol 0  Illegal Drugs 0  Rx Drugs 0  Age between 16-45 years  0  History of Preadolescent Sexual Abuse 0  Psychological Disease 0  Depression 1  Opioid Risk Tool Scoring 4  Opioid Risk Interpretation Moderate Risk        08/25/2023    1:49 PM 06/10/2023    4:05 PM 05/23/2023    1:00 PM  Depression screen PHQ 2/9  Decreased Interest 1 1 1   Down, Depressed, Hopeless 1 1 2   PHQ - 2 Score 2 2 3   Altered sleeping 2 2 1   Tired, decreased energy 3 3 3   Change in appetite 2 1 2   Feeling bad or failure about yourself  1 1 1   Trouble concentrating 1 1 0  Moving slowly or fidgety/restless 1 1 0  Suicidal thoughts 0 2 0  PHQ-9 Score 12 13 10   Difficult doing work/chores Somewhat difficult  Somewhat difficult      08/25/2023    1:50 PM 06/10/2023    4:06 PM 05/23/2023    1:00 PM 05/13/2023   12:52 PM  GAD 7 : Generalized Anxiety Score  Nervous, Anxious, on Edge 1 1 1 1   Control/stop worrying 0 1 1 1   Worry too much - different things 1 1 2 2   Trouble relaxing 1 1 1 1   Restless 1 1 1 1   Easily annoyed or irritable 2 2 1 1   Afraid - awful might happen 2  2 1   Total GAD 7 Score 8  9 8   Anxiety Difficulty Somewhat difficult  Somewhat difficult Somewhat difficult     Past Medical History:  Diagnosis  Date   Anemia    Arthritis    Chronic back pain    lumbar 4-5 slip and radicular right leg pain   COVID-19 07/27/2019   Depression    Diabetes mellitus without complication (HCC)    GERD (gastroesophageal reflux disease)    Hyperlipidemia    Hypertension    Neuropathy    Seasonal allergies    Vitamin D deficiency    Wears glasses       ROS As per HPI.    Objective:     BP 112/79   Pulse 87   Temp 97.9 F (36.6 C) (Temporal)   Ht 5\' 4"  (1.626 m)   Wt 214 lb (97.1 kg)   LMP  (LMP Unknown)   SpO2 98%   BMI 36.73 kg/m   BP Readings from Last 3 Encounters:  08/25/23 112/79  06/10/23 100/70  05/23/23 102/73   Wt Readings from Last 3 Encounters:  08/25/23 214 lb (97.1 kg)  06/10/23 217 lb (98.4 kg)  05/23/23 213 lb 6 oz (96.8 kg)     Physical Exam   No results found for any visits on 08/25/23.    The 10-year ASCVD risk score (Arnett DK, et al., 2019) is: 5.7%    Assessment & Plan:   Type 2 diabetes mellitus with hyperglycemia, without long-term current use of insulin (HCC) Assessment & Plan: A1c 6.5 today, at goal of <7. Medication changes today: Continue jardiance, glipizide, ozempic. Continue other medications. She is on an ACE/ARB and statin.   Orders: -     Bayer DCA Hb A1c Waived -     Ozempic (2 MG/DOSE); INJECT 2MG  INTO THE SKIN ONCE A WEEK  Dispense: 9 mL; Refill: 3  Long term current use of oral hypoglycemic drug  Long-term current use of injectable noninsulin antidiabetic medication  Hypertension associated with type 2 diabetes mellitus (HCC) Assessment & Plan: Well controlled with lisinopril.  Orders: -     CBC with Differential/Platelet -     CMP14+EGFR  Hyperlipidemia associated with type 2 diabetes mellitus (HCC) Assessment & Plan: Last LDL at 70. On statin.   Orders: -     Lipid panel -     Rosuvastatin Calcium; TAKE ONE (1) TABLET EACH DAY  Dispense: 90 tablet; Refill: 3  Morbid obesity (HCC) Assessment & Plan: Diet,  exercise, weight loss.    Gastroesophageal reflux disease, unspecified whether esophagitis present Assessment & Plan: Well controlled with omeprazole 20 mg.   Orders: -     Omeprazole; Take 1 capsule (20 mg total) by mouth daily.  Dispense: 90 capsule; Refill: 3  Neuropathy  Assessment & Plan: Well controlled with gabapentin TID.   Orders: -     Gabapentin; TAKE ONE (1) CAPSULE BY MOUTH 2 TIMES DAILY  Dispense: 180 capsule; Refill: 1  Chronic bilateral low back pain without sciatica Assessment & Plan: Well controlled on current regimen. PDMP reviewed, no red flags. UDS and CSA are UTD. Refill provided.   Orders: -     Gabapentin; TAKE ONE (1) CAPSULE BY MOUTH 2 TIMES DAILY  Dispense: 180 capsule; Refill: 1 -     HYDROcodone-Acetaminophen; Take 1 tablet by mouth daily as needed for moderate pain (pain score 4-6).  Dispense: 30 tablet; Refill: 0  Controlled substance agreement signed -     HYDROcodone-Acetaminophen; Take 1 tablet by mouth daily as needed for moderate pain (pain score 4-6).  Dispense: 30 tablet; Refill: 0  Recurrent moderate major depressive disorder with anxiety (HCC) Assessment & Plan: Stable. Managed by [facility]. On caplyta, abilify.     Return in about 3 months (around 11/23/2023) for chronic follow up.   The patient indicates understanding of these issues and agrees with the plan.  Gabriel Earing, FNP

## 2023-08-25 NOTE — Assessment & Plan Note (Signed)
Stable. Managed by [facility]. On caplyta, abilify.

## 2023-08-25 NOTE — Assessment & Plan Note (Signed)
Well controlled with lisinopril.

## 2023-08-25 NOTE — Assessment & Plan Note (Signed)
A1c 6.5 today, at goal of <7. Medication changes today: Continue jardiance, glipizide, ozempic. Continue other medications. She is on an ACE/ARB and statin.

## 2023-08-25 NOTE — Assessment & Plan Note (Signed)
Last LDL at 70. On statin.

## 2023-08-25 NOTE — Assessment & Plan Note (Signed)
Well controlled on current regimen. PDMP reviewed, no red flags. UDS and CSA are UTD. Refill provided.

## 2023-08-25 NOTE — Assessment & Plan Note (Signed)
Well controlled with gabapentin TID.

## 2023-08-25 NOTE — Addendum Note (Signed)
Addended by: Gabriel Earing on: 08/25/2023 03:39 PM   Modules accepted: Level of Service

## 2023-08-26 LAB — CMP14+EGFR
ALT: 18 IU/L (ref 0–32)
AST: 22 IU/L (ref 0–40)
Albumin: 4.1 g/dL (ref 3.8–4.9)
Alkaline Phosphatase: 133 IU/L — ABNORMAL HIGH (ref 44–121)
BUN/Creatinine Ratio: 9 (ref 9–23)
BUN: 8 mg/dL (ref 6–24)
Bilirubin Total: 0.4 mg/dL (ref 0.0–1.2)
CO2: 24 mmol/L (ref 20–29)
Calcium: 9.9 mg/dL (ref 8.7–10.2)
Chloride: 107 mmol/L — ABNORMAL HIGH (ref 96–106)
Creatinine, Ser: 0.94 mg/dL (ref 0.57–1.00)
Globulin, Total: 2.7 g/dL (ref 1.5–4.5)
Glucose: 113 mg/dL — ABNORMAL HIGH (ref 70–99)
Potassium: 3.9 mmol/L (ref 3.5–5.2)
Sodium: 144 mmol/L (ref 134–144)
Total Protein: 6.8 g/dL (ref 6.0–8.5)
eGFR: 71 mL/min/{1.73_m2} (ref >59)

## 2023-08-26 LAB — LIPID PANEL
Cholesterol, Total: 132 mg/dL (ref 100–199)
HDL: 49 mg/dL (ref >39)
LDL CALC COMMENT:: 2.7 ratio (ref 0.0–4.4)
LDL Chol Calc (NIH): 71 mg/dL (ref 0–99)
Triglycerides: 56 mg/dL (ref 0–149)
VLDL Cholesterol Cal: 12 mg/dL (ref 5–40)

## 2023-08-26 LAB — CBC WITH DIFFERENTIAL/PLATELET
Basophils Absolute: 0 10*3/uL (ref 0.0–0.2)
Basos: 1 %
EOS (ABSOLUTE): 0.1 10*3/uL (ref 0.0–0.4)
Eos: 1 %
Hematocrit: 40.9 % (ref 34.0–46.6)
Hemoglobin: 13.4 g/dL (ref 11.1–15.9)
Immature Grans (Abs): 0 10*3/uL (ref 0.0–0.1)
Immature Granulocytes: 0 %
Lymphocytes Absolute: 1.9 10*3/uL (ref 0.7–3.1)
Lymphs: 36 %
MCH: 28.6 pg (ref 26.6–33.0)
MCHC: 32.8 g/dL (ref 31.5–35.7)
MCV: 87 fL (ref 79–97)
Monocytes Absolute: 0.4 10*3/uL (ref 0.1–0.9)
Monocytes: 8 %
Neutrophils Absolute: 2.8 10*3/uL (ref 1.4–7.0)
Neutrophils: 54 %
Platelets: 267 10*3/uL (ref 150–450)
RBC: 4.69 x10E6/uL (ref 3.77–5.28)
RDW: 12.7 % (ref 11.7–15.4)
WBC: 5.2 10*3/uL (ref 3.4–10.8)

## 2023-08-27 ENCOUNTER — Other Ambulatory Visit (HOSPITAL_COMMUNITY): Payer: Self-pay

## 2023-08-27 ENCOUNTER — Telehealth: Payer: Self-pay

## 2023-08-27 ENCOUNTER — Encounter: Payer: Self-pay | Admitting: Family Medicine

## 2023-08-27 NOTE — Telephone Encounter (Signed)
Pharmacy Patient Advocate Encounter   Received notification from  [facility] Portal that prior authorization for HYDROcodone-Acetaminophen 5-325MG  tablets is required/requested.   Insurance verification completed.   The patient is insured through [facility] .   Per test claim: PA required; PA submitted to above mentioned insurance via CoverMyMeds Key/confirmation #/EOC [facility] Status is pending

## 2023-08-27 NOTE — Telephone Encounter (Signed)
Pharmacy Patient Advocate Encounter  Received notification from [facility] that Prior Authorization for HYDROcodone-Acetaminophen 5-325MG  tablets has been APPROVED from 08/27/23 to 09/26/23. Ran test claim, Copay is $7.10. This test claim was processed through [facility]- copay amounts may vary at other pharmacies due to pharmacy/plan contracts, or as the patient moves through the different stages of their insurance plan.   PA #/Case ID/Reference #: [id number]

## 2023-10-02 LAB — HM DIABETES EYE EXAM

## 2023-11-27 ENCOUNTER — Encounter: Payer: Self-pay | Admitting: Family Medicine

## 2023-11-27 ENCOUNTER — Ambulatory Visit (INDEPENDENT_AMBULATORY_CARE_PROVIDER_SITE_OTHER): Payer: BC Managed Care – PPO | Admitting: Family Medicine

## 2023-11-27 VITALS — BP 121/77 | HR 86 | Temp 97.8°F | Ht 64.0 in | Wt 215.6 lb

## 2023-11-27 DIAGNOSIS — E1159 Type 2 diabetes mellitus with other circulatory complications: Secondary | ICD-10-CM

## 2023-11-27 DIAGNOSIS — E1169 Type 2 diabetes mellitus with other specified complication: Secondary | ICD-10-CM | POA: Diagnosis not present

## 2023-11-27 DIAGNOSIS — Z79899 Other long term (current) drug therapy: Secondary | ICD-10-CM

## 2023-11-27 DIAGNOSIS — E559 Vitamin D deficiency, unspecified: Secondary | ICD-10-CM

## 2023-11-27 DIAGNOSIS — E785 Hyperlipidemia, unspecified: Secondary | ICD-10-CM

## 2023-11-27 DIAGNOSIS — E1165 Type 2 diabetes mellitus with hyperglycemia: Secondary | ICD-10-CM | POA: Diagnosis not present

## 2023-11-27 DIAGNOSIS — K219 Gastro-esophageal reflux disease without esophagitis: Secondary | ICD-10-CM

## 2023-11-27 DIAGNOSIS — I152 Hypertension secondary to endocrine disorders: Secondary | ICD-10-CM

## 2023-11-27 DIAGNOSIS — F331 Major depressive disorder, recurrent, moderate: Secondary | ICD-10-CM

## 2023-11-27 DIAGNOSIS — M545 Low back pain, unspecified: Secondary | ICD-10-CM

## 2023-11-27 DIAGNOSIS — G629 Polyneuropathy, unspecified: Secondary | ICD-10-CM

## 2023-11-27 LAB — BAYER DCA HB A1C WAIVED: HB A1C (BAYER DCA - WAIVED): 6.8 % — ABNORMAL HIGH (ref 4.8–5.6)

## 2023-11-27 MED ORDER — HYDROCODONE-ACETAMINOPHEN 5-325 MG PO TABS
1.0000 | ORAL_TABLET | Freq: Every day | ORAL | 0 refills | Status: DC | PRN
Start: 2023-11-27 — End: 2024-03-01

## 2023-11-27 NOTE — Progress Notes (Signed)
 Established Patient Office Visit  Subjective   Patient ID: Tammy Mayo, female    DOB: Dec 21, 1965  Age: 58 y.o. MRN: [id number]  Chief Complaint  Patient presents with   Medical Management of Chronic Issues    HPI  DM Pt presents for follow up evaluation of Type 2 diabetes mellitus.  Current symptoms include paresthesia of the feet. Patient denies foot ulcerations, increased appetite, nausea, polydipsia, polyuria, visual disturbances, vomiting, and weight loss.  Current diabetic medications include jardiance  25 mg, ozempic  2 mg, glipizide .  Compliant with meds - Yes   Current monitoring regimen: daily Home blood sugar records:  100-130 fasting Any episodes of hypoglycemia? no  Current diet: "not that good" Current exercise: none  Is She on ACE inhibitor or angiotensin II receptor blocker?  Yes, lisinopril  Is She on statin? Yes crestor   2. HTN Complaint with meds - Yes Current Medications - lisinopril  Pertinent ROS:  Headache - No Fatigue - yes, baseline Visual Disturbances - No Chest pain - No Dyspnea - No Palpitations - No LE edema - No  3. HLD She is on crestor . Regular diet. No exercise.   4. GERD Compliant with medications - Yes Current medications - prilosec 20 mg  Cough - No Sore throat - No Voice change - No Hemoptysis - No Dysphagia or dyspepsia - No Water brash - No Red Flags (weight loss, hematochezia, melena, weight loss, early satiety, fevers, odynophagia, or persistent vomiting) - No  5. Depression/anxiety Managed by [facility]. BH is having to change medications due to insurance. Reports passive SI some days. Denies plan or intent. Feels safe to herself. Has follow up scheduled.   7. Chronic back Pain Cause of pain- lumbar fusion at L4-L5 with screws and a spacer Pain location- low back Pain on scale of 1-10- 4-5/10 with medication; 7/10 without medication Frequency- daily What increases pain- house chores and bending, standing for long  periods What makes pain better- Biofreeze, rest, lidocaine  patches, tylenol  Effects on ADL - makes difficult Any change in general medical condition- no   Current opioids rx- Norco 5/325 - patient is has been taking as needed which is about twice weekly at most # meds rx- 30 (which is lasting 3 months at a time) Effectiveness of current meds- good Adverse reactions from pain meds- none Morphine  equivalent- 5 MME/day   Pill count performed-No Last drug screen - 11/15/22 ( high risk q52m, moderate risk q51m, low risk yearly ) Urine drug screen today- Yes Was the NCCSR reviewed- Yes             If yes were their any concerning findings? - No   Overdose risk: 260   Opioid Risk  12/17/2019  Alcohol 3  Illegal Drugs 0  Rx Drugs 0  Alcohol 0  Illegal Drugs 0  Rx Drugs 0  Age between 16-45 years  0  History of Preadolescent Sexual Abuse 0  Psychological Disease 0  Depression 1  Opioid Risk Tool Scoring 4  Opioid Risk Interpretation Moderate Risk        11/27/2023    2:31 PM 08/25/2023    1:49 PM 06/10/2023    4:05 PM  Depression screen PHQ 2/9  Decreased Interest 1 1 1   Down, Depressed, Hopeless 1 1 1   PHQ - 2 Score 2 2 2   Altered sleeping 0 2 2  Tired, decreased energy 3 3 3   Change in appetite 1 2 1   Feeling bad or failure about yourself  1  1 1  Trouble concentrating 0 1 1  Moving slowly or fidgety/restless 0 1 1  Suicidal thoughts 1 0 2  PHQ-9 Score 8 12 13   Difficult doing work/chores Very difficult Somewhat difficult       11/27/2023    2:32 PM 08/25/2023    1:50 PM 06/10/2023    4:06 PM 05/23/2023    1:00 PM  GAD 7 : Generalized Anxiety Score  Nervous, Anxious, on Edge 1 1 1 1   Control/stop worrying 1 0 1 1  Worry too much - different things 1 1 1 2   Trouble relaxing 1 1 1 1   Restless 1 1 1 1   Easily annoyed or irritable 1 2 2 1   Afraid - awful might happen 1 2  2   Total GAD 7 Score 7 8  9   Anxiety Difficulty Somewhat difficult Somewhat difficult  Somewhat  difficult     Past Medical History:  Diagnosis Date   Anemia    Arthritis    Chronic back pain    lumbar 4-5 slip and radicular right leg pain   COVID-19 07/27/2019   Depression    Diabetes mellitus without complication (HCC)    GERD (gastroesophageal reflux disease)    Hyperlipidemia    Hypertension    Neuropathy    Seasonal allergies    Vitamin D  deficiency    Wears glasses       ROS As per HPI.    Objective:     BP 121/77   Pulse 86   Temp 97.8 F (36.6 C) (Temporal)   Ht 5\' 4"  (1.626 m)   Wt 215 lb 9.6 oz (97.8 kg)   LMP  (LMP Unknown)   SpO2 96%   BMI 37.01 kg/m   BP Readings from Last 3 Encounters:  11/27/23 121/77  08/25/23 112/79  06/10/23 100/70   Wt Readings from Last 3 Encounters:  11/27/23 215 lb 9.6 oz (97.8 kg)  08/25/23 214 lb (97.1 kg)  06/10/23 217 lb (98.4 kg)     Physical Exam Vitals and nursing note reviewed.  Constitutional:      General: She is not in acute distress.    Appearance: Normal appearance. She is not ill-appearing.  Cardiovascular:     Rate and Rhythm: Normal rate and regular rhythm.     Pulses: Normal pulses.     Heart sounds: Normal heart sounds. No murmur heard. Pulmonary:     Effort: Pulmonary effort is normal. No respiratory distress.     Breath sounds: Normal breath sounds.  Abdominal:     General: Bowel sounds are normal. There is no distension.     Palpations: Abdomen is soft. There is no mass.     Tenderness: There is no abdominal tenderness. There is no guarding or rebound.  Musculoskeletal:     Cervical back: Neck supple. No tenderness.     Right lower leg: No edema.     Left lower leg: No edema.  Lymphadenopathy:     Cervical: No cervical adenopathy.  Skin:    General: Skin is warm and dry.  Neurological:     General: No focal deficit present.     Mental Status: She is alert and oriented to person, place, and time.  Psychiatric:        Mood and Affect: Mood normal.        Behavior: Behavior  normal.      No results found for any visits on 11/27/23.    The 10-year ASCVD risk  score (Arnett DK, et al., 2019) is: 7.7%    Assessment & Plan:   Type 2 diabetes mellitus with hyperglycemia, without long-term current use of insulin  (HCC) A1c 6.8, at goal of <7.0. On ACE and statin.  -     Bayer DCA Hb A1c Waived  Hypertension associated with type 2 diabetes mellitus (HCC) Well controlled on current regimen.   Hyperlipidemia associated with type 2 diabetes mellitus (HCC) LDL at 71. On statin.   Morbid obesity (HCC) Diet, exercise, weight loss.   Neuropathy Stable with gabapentin .   Gastroesophageal reflux disease, unspecified whether esophagitis present Well controlled on current regimen.   Vitamin D  deficiency -     VITAMIN D  25 Hydroxy (Vit-D Deficiency, Fractures)  Controlled substance agreement signed Chronic bilateral low back pain without sciatica Well controlled on current regimen. CSA and UDS updated today. Takes 2x a week. PDMP reviewed, no red flags.  -     HYDROcodone -Acetaminophen ; Take 1 tablet by mouth daily as needed for moderate pain (pain score 4-6).  Dispense: 30 tablet; Refill: 0  Recurrent moderate major depressive disorder with anxiety (HCC) Managed by [facility]. Passive SI- denies plan or intent. Able to verbally contract for safety today.   Return in about 3 months (around 02/27/2024) for chronic follow up.   The patient indicates understanding of these issues and agrees with the plan.  Albertha Huger, FNP

## 2023-11-28 ENCOUNTER — Encounter: Payer: Self-pay | Admitting: Family Medicine

## 2023-11-28 ENCOUNTER — Other Ambulatory Visit: Payer: Self-pay | Admitting: Family Medicine

## 2023-11-28 DIAGNOSIS — E559 Vitamin D deficiency, unspecified: Secondary | ICD-10-CM

## 2023-11-28 LAB — VITAMIN D 25 HYDROXY (VIT D DEFICIENCY, FRACTURES): Vit D, 25-Hydroxy: 28.7 ng/mL — ABNORMAL LOW (ref 30.0–100.0)

## 2023-11-28 MED ORDER — VITAMIN D (ERGOCALCIFEROL) 1.25 MG (50000 UNIT) PO CAPS: 50000.0000 [IU] | ORAL_CAPSULE | ORAL | 0 refills | Status: DC

## 2023-12-01 LAB — TOXASSURE SELECT 13 (MW), URINE

## 2023-12-29 ENCOUNTER — Ambulatory Visit: Payer: Self-pay

## 2023-12-29 NOTE — Telephone Encounter (Signed)
 Chief Complaint: spot on the leg Symptoms: spot, redness Frequency: 3 wks Pertinent Negatives: Patient denies fever, chills, vomiting, swelling, heat, infection Disposition: [] ED /[] Urgent Care (no appt availability in office) / [x] Appointment(In office/virtual)/ []  [facility]/ [] Home Care/ [] Refused Recommended Disposition /[] [facility]/ []  Follow-up with PCP Additional Notes: Pt reports a quart-sized red spot to her R leg for 3 wks. Pt does not know where the spot came from. Denies any recent injuries, scrapes, or bites. Denies itching or pain. Denies swelling and heat. Denies spreading or streaking redness. Pt states the area is "hard to describe." Pt states it has not healed or gone away since she noticed it 3 wks ago. RN advised pt she should be seen within 3 days and scheduled the pt for tomorrow. RN advised pt go to the ED for worsening. Pt verbalized understanding.    Copied from CRM [id number]. Topic: Clinical - Pink Word Triage >> Dec 29, 2023  3:28 PM Tiffany H wrote: Patient called to advise that she has a red spot on her leg. It's the size of a quarter and it's been there for 3 weeks. It's not painful but it's not healing, either. Please assist. Reason for Disposition  Caller can't describe it clearly  Answer Assessment - Initial Assessment Questions 1. APPEARANCE of LESION: "What does it look like?"      Red spot to her R leg  2. SIZE: "How big is it?" (e.g., inches, cm; or compare to size of pinhead, tip of pen, eraser, coin, pea, grape, ping pong ball)      Size of a quarter 3. COLOR: "What color is it?" "Is there more than one color?"     red 4. SHAPE: "What shape is it?" (e.g., round, irregular)     Circular, but not round 5. RAISED: "Does it stick up above the skin or is it flat?" (e.g., raised or elevated)     Flat  6. TENDER: "Does it hurt when you touch it?"  (Scale 1-10; or mild, moderate, severe)     No  7. LOCATION: "Where is it located?"       R leg  8. ONSET: "When did it first appear?"      3 wks 9. NUMBER: "Is there just one?" or "Are there others?"     Just one  10. CAUSE: "What do you think it is?"       No memory of insect bite, not sure  11. OTHER SYMPTOMS: "Do you have any other symptoms?" (e.g., fever)       No bites, no injuries, no scrapes. Denies fever or chills. Denies pus or drainage. Denies heat. Denies swelling.  Protocols used: Skin Lesion - Moles or Growths-A-AH

## 2023-12-29 NOTE — Telephone Encounter (Signed)
 Appointment made

## 2023-12-30 ENCOUNTER — Ambulatory Visit (INDEPENDENT_AMBULATORY_CARE_PROVIDER_SITE_OTHER): Admitting: Family Medicine

## 2023-12-30 ENCOUNTER — Encounter: Payer: Self-pay | Admitting: Family Medicine

## 2023-12-30 VITALS — BP 108/75 | HR 92 | Temp 98.2°F | Ht 64.0 in | Wt 214.0 lb

## 2023-12-30 DIAGNOSIS — L309 Dermatitis, unspecified: Secondary | ICD-10-CM

## 2023-12-30 MED ORDER — FLUOCINONIDE 0.05 % EX CREA
1.0000 | TOPICAL_CREAM | Freq: Two times a day (BID) | CUTANEOUS | 5 refills | Status: AC
Start: 2023-12-30 — End: ?

## 2023-12-30 NOTE — Progress Notes (Signed)
   Subjective:  Patient ID: Tammy Mayo, female    DOB: 10/12/1965  Age: 58 y.o. MRN: [id number]  CC: Skin Discoloration (Right calf. 3 weeks. Dry but no itch. No injury. Not spreading. Neosporin and cortisone. )   HPI Tammy Mayo presents for red patch on skin starting about 3 weeks ago.  No itching or burning it is not getting bigger or smaller.  She is just concerned what it means because she is a diabetic and thought it might be related to diabetes and evidence for a more serious problem.  It is located on the right shin about midshaft.  No known injury or nidus that could have started it.     12/30/2023    2:16 PM 11/27/2023    2:31 PM 08/25/2023    1:49 PM  Depression screen PHQ 2/9  Decreased Interest 1 1 1   Down, Depressed, Hopeless 1 1 1   PHQ - 2 Score 2 2 2   Altered sleeping 3 0 2  Tired, decreased energy 3 3 3   Change in appetite 2 1 2   Feeling bad or failure about yourself  1 1 1   Trouble concentrating 0 0 1  Moving slowly or fidgety/restless 1 0 1  Suicidal thoughts 1 1 0  PHQ-9 Score 13 8 12   Difficult doing work/chores Not difficult at all Very difficult Somewhat difficult    History Tammy Mayo has a past medical history of Anemia, Arthritis, Chronic back pain, COVID-19 (07/27/2019), Depression, Diabetes mellitus without complication (HCC), GERD (gastroesophageal reflux disease), Hyperlipidemia, Hypertension, Neuropathy, Seasonal allergies, Vitamin D  deficiency, and Wears glasses.   She has a past surgical history that includes Tubal ligation; Dilation and curettage of uterus; Cesarean section; Hernia repair; Back surgery (05/2015); Cholecystectomy (N/A, 11/13/2015); Arthroscopic rotator cuff repair (Right, 05/18/2020); and Bicept tenodesis (Right, 05/18/2020).   Her family history includes Diabetes in her father and mother; Gout in her brother; Hyperlipidemia in her father and sister; Hypertension in her brother, brother, father, mother, sister, and sister;  Kidney disease in her father; Stroke in her mother.She reports that she has never smoked. She has never used smokeless tobacco. She reports that she does not drink alcohol and does not use drugs.    ROS Review of Systems  Objective:  BP 108/75   Pulse 92   Temp 98.2 F (36.8 C)   Ht 5\' 4"  (1.626 m)   Wt 214 lb (97.1 kg)   LMP  (LMP Unknown)   SpO2 96%   BMI 36.73 kg/m   BP Readings from Last 3 Encounters:  12/30/23 108/75  11/27/23 121/77  08/25/23 112/79    Wt Readings from Last 3 Encounters:  12/30/23 214 lb (97.1 kg)  11/27/23 215 lb 9.6 oz (97.8 kg)  08/25/23 214 lb (97.1 kg)     Physical Exam 1/2 inch x 1 inch slightly raised erythematous area.  There is little bit of an ointment feel to the surface so any light scale that may be there is not noted.  It is well-circumscribed oval to elliptical shaped.    Assessment & Plan:  Eczema, unspecified type  Other orders -     Fluocinonide; Apply 1 Application topically 2 (two) times daily.  Dispense: 30 g; Refill: 5     Follow-up: Return if symptoms worsen or fail to improve.  Roise Cleaver, M.D.

## 2024-01-13 ENCOUNTER — Other Ambulatory Visit: Payer: Self-pay | Admitting: Family Medicine

## 2024-01-13 DIAGNOSIS — E1140 Type 2 diabetes mellitus with diabetic neuropathy, unspecified: Secondary | ICD-10-CM

## 2024-01-15 ENCOUNTER — Other Ambulatory Visit: Payer: Self-pay | Admitting: Family Medicine

## 2024-01-15 DIAGNOSIS — E559 Vitamin D deficiency, unspecified: Secondary | ICD-10-CM

## 2024-03-01 ENCOUNTER — Encounter: Payer: Self-pay | Admitting: Family Medicine

## 2024-03-01 ENCOUNTER — Ambulatory Visit (INDEPENDENT_AMBULATORY_CARE_PROVIDER_SITE_OTHER): Admitting: Family Medicine

## 2024-03-01 VITALS — BP 114/80 | HR 82 | Temp 97.7°F | Ht 64.0 in | Wt 215.2 lb

## 2024-03-01 DIAGNOSIS — E785 Hyperlipidemia, unspecified: Secondary | ICD-10-CM

## 2024-03-01 DIAGNOSIS — G629 Polyneuropathy, unspecified: Secondary | ICD-10-CM

## 2024-03-01 DIAGNOSIS — M545 Low back pain, unspecified: Secondary | ICD-10-CM

## 2024-03-01 DIAGNOSIS — Z7985 Long-term (current) use of injectable non-insulin antidiabetic drugs: Secondary | ICD-10-CM

## 2024-03-01 DIAGNOSIS — Z7984 Long term (current) use of oral hypoglycemic drugs: Secondary | ICD-10-CM

## 2024-03-01 DIAGNOSIS — Z79899 Other long term (current) drug therapy: Secondary | ICD-10-CM

## 2024-03-01 DIAGNOSIS — K219 Gastro-esophageal reflux disease without esophagitis: Secondary | ICD-10-CM

## 2024-03-01 DIAGNOSIS — F419 Anxiety disorder, unspecified: Secondary | ICD-10-CM

## 2024-03-01 DIAGNOSIS — E1165 Type 2 diabetes mellitus with hyperglycemia: Secondary | ICD-10-CM | POA: Diagnosis not present

## 2024-03-01 DIAGNOSIS — E1169 Type 2 diabetes mellitus with other specified complication: Secondary | ICD-10-CM

## 2024-03-01 DIAGNOSIS — G8929 Other chronic pain: Secondary | ICD-10-CM

## 2024-03-01 DIAGNOSIS — F331 Major depressive disorder, recurrent, moderate: Secondary | ICD-10-CM

## 2024-03-01 LAB — BAYER DCA HB A1C WAIVED: HB A1C (BAYER DCA - WAIVED): 7.1 % — ABNORMAL HIGH (ref 4.8–5.6)

## 2024-03-01 MED ORDER — GABAPENTIN 300 MG PO CAPS: ORAL_CAPSULE | ORAL | 1 refills | Status: AC

## 2024-03-01 MED ORDER — HYDROCODONE-ACETAMINOPHEN 5-325 MG PO TABS: 1.0000 | ORAL_TABLET | Freq: Every day | ORAL | 0 refills | Status: DC | PRN

## 2024-03-01 NOTE — Progress Notes (Signed)
 Established Patient Office Visit  Subjective   Patient ID: Tammy Mayo, female    DOB: 09-16-1965  Age: 58 y.o. MRN: [id number]  Chief Complaint  Patient presents with   Medical Management of Chronic Issues    HPI  DM Pt presents for follow up evaluation of Type 2 diabetes mellitus.  Current symptoms include paresthesia of the feet. Patient denies foot ulcerations, increased appetite, nausea, polydipsia, polyuria, visual disturbances, vomiting, and weight loss.  Current diabetic medications include jardiance  25 mg, ozempic  2 mg, glipizide .  Compliant with meds - Yes   Current monitoring regimen: daily Home blood sugar records: 90-120 fasting Any episodes of hypoglycemia? no  Current diet: not that good Current exercise: none  Is She on ACE inhibitor or angiotensin II receptor blocker?  Yes, lisinopril  Is She on statin? Yes crestor   2. HTN Complaint with meds - Yes Current Medications - lisinopril  Pertinent ROS:  Headache - No Fatigue - yes, baseline Visual Disturbances - No Chest pain - No Dyspnea - No Palpitations - No LE edema - No  3. HLD She is on crestor . Regular diet. No exercise.   4. GERD Compliant with medications - Yes Current medications - prilosec 20 mg  Cough - No Sore throat - No Voice change - No Hemoptysis - No Dysphagia or dyspepsia - No Water brash - No Red Flags (weight loss, hematochezia, melena, weight loss, early satiety, fevers, odynophagia, or persistent vomiting) - No  5. Depression/anxiety Managed by [facility]. Reports stable. Denies SI.   7. Chronic back Pain Cause of pain- lumbar fusion at L4-L5 with screws and a spacer Pain location- low back Pain on scale of 1-10- 4-5/10 with medication; 7/10 without medication Frequency- daily What increases pain- house chores and bending, standing for long periods What makes pain better- Biofreeze, rest, lidocaine  patches, tylenol  Effects on ADL - makes difficult Any change in  general medical condition- no   Current opioids rx- Norco 5/325 - patient is has been taking as needed which is about twice weekly at most # meds rx- 30 (which is lasting 3 months at a time) Effectiveness of current meds- good Adverse reactions from pain meds- none Morphine  equivalent- 1.33 MME/day average   Pill count performed-No Last drug screen - 11/27/23 ( high risk q81m, moderate risk q91m, low risk yearly ) Urine drug screen today- Yes Was the NCCSR reviewed- Yes             If yes were their any concerning findings? - No    Opioid Risk  12/17/2019  Alcohol 3  Illegal Drugs 0  Rx Drugs 0  Alcohol 0  Illegal Drugs 0  Rx Drugs 0  Age between 16-45 years  0  History of Preadolescent Sexual Abuse 0  Psychological Disease 0  Depression 1  Opioid Risk Tool Scoring 4  Opioid Risk Interpretation Moderate Risk        03/01/2024    1:15 PM 12/30/2023    2:16 PM 11/27/2023    2:31 PM  Depression screen PHQ 2/9  Decreased Interest 2 1 1   Down, Depressed, Hopeless 1 1 1   PHQ - 2 Score 3 2 2   Altered sleeping 3 3 0  Tired, decreased energy 2 3 3   Change in appetite 2 2 1   Feeling bad or failure about yourself  1 1 1   Trouble concentrating 0 0 0  Moving slowly or fidgety/restless 0 1 0  Suicidal thoughts 0 1 1  PHQ-9 Score 11  13 8  Difficult doing work/chores Somewhat difficult Not difficult at all Very difficult      03/01/2024    1:16 PM 11/27/2023    2:32 PM 08/25/2023    1:50 PM 06/10/2023    4:06 PM  GAD 7 : Generalized Anxiety Score  Nervous, Anxious, on Edge 1 1 1 1   Control/stop worrying 1 1 0 1  Worry too much - different things 1 1 1 1   Trouble relaxing 1 1 1 1   Restless 0 1 1 1   Easily annoyed or irritable 0 1 2 2   Afraid - awful might happen 1 1 2    Total GAD 7 Score 5 7 8    Anxiety Difficulty Somewhat difficult Somewhat difficult Somewhat difficult      Past Medical History:  Diagnosis Date   Anemia    Arthritis    Chronic back pain    lumbar 4-5 slip  and radicular right leg pain   COVID-19 07/27/2019   Depression    Diabetes mellitus without complication (HCC)    GERD (gastroesophageal reflux disease)    Hyperlipidemia    Hypertension    Neuropathy    Seasonal allergies    Vitamin D  deficiency    Wears glasses       ROS As per HPI.    Objective:     BP 114/80   Pulse 82   Temp 97.7 F (36.5 C) (Temporal)   Ht 5' 4 (1.626 m)   Wt 215 lb 3.2 oz (97.6 kg)   LMP  (LMP Unknown)   SpO2 97%   BMI 36.94 kg/m   BP Readings from Last 3 Encounters:  03/01/24 114/80  12/30/23 108/75  11/27/23 121/77   Wt Readings from Last 3 Encounters:  03/01/24 215 lb 3.2 oz (97.6 kg)  12/30/23 214 lb (97.1 kg)  11/27/23 215 lb 9.6 oz (97.8 kg)     Physical Exam Vitals and nursing note reviewed.  Constitutional:      General: She is not in acute distress.    Appearance: Normal appearance. She is not ill-appearing.  Cardiovascular:     Rate and Rhythm: Normal rate and regular rhythm.     Pulses: Normal pulses.     Heart sounds: Normal heart sounds. No murmur heard. Pulmonary:     Effort: Pulmonary effort is normal. No respiratory distress.     Breath sounds: Normal breath sounds.  Abdominal:     General: Bowel sounds are normal. There is no distension.     Palpations: Abdomen is soft. There is no mass.     Tenderness: There is no abdominal tenderness. There is no guarding or rebound.  Musculoskeletal:     Cervical back: Neck supple. No tenderness.     Right lower leg: No edema.     Left lower leg: No edema.  Lymphadenopathy:     Cervical: No cervical adenopathy.  Skin:    General: Skin is warm and dry.  Neurological:     General: No focal deficit present.     Mental Status: She is alert and oriented to person, place, and time.  Psychiatric:        Mood and Affect: Mood normal.        Behavior: Behavior normal.      No results found for any visits on 03/01/24.    The 10-year ASCVD risk score (Arnett DK, et  al., 2019) is: 6.8%    Assessment & Plan:   [place]pet[place] was seen today for  medical management of chronic issues.  Diagnoses and all orders for this visit:  Type 2 diabetes mellitus with hyperglycemia, without long-term current use of insulin  (HCC) A1c 7.1, not at goal of <7. Discussed diet and exercise to lower A1c. Continue glipizide , ozempic , jardiance . On ACE/ARB and statin.  -     Microalbumin / creatinine urine ratio -     Bayer DCA Hb A1c Waived  Long-term current use of injectable noninsulin antidiabetic medication  Long term current use of oral hypoglycemic drug  Hyperlipidemia associated with type 2 diabetes mellitus (HCC) On statin. Diet, exercise, weight loss.   Morbid obesity (HCC) Diet, exercise, weight loss.   Chronic bilateral low back pain without sciatica Controlled substance agreement signed Well controlled on current regimen. PDMP reviewed, no red flags. CSA and UDS are UTD.  -     HYDROcodone -acetaminophen  (NORCO/VICODIN) 5-325 MG tablet; Take 1 tablet by mouth daily as needed for moderate pain (pain score 4-6). -     gabapentin  (NEURONTIN ) 300 MG capsule; TAKE ONE (1) CAPSULE BY MOUTH 2 TIMES DAILY   Gastroesophageal reflux disease, unspecified whether esophagitis present Well controlled on current regimen.   Neuropathy Well controlled on current regimen.  -     gabapentin  (NEURONTIN ) 300 MG capsule; TAKE ONE (1) CAPSULE BY MOUTH 2 TIMES DAILY  Recurrent moderate major depressive disorder with anxiety (HCC) Continue follow up with BH. Stable. Denies SI.    Return in about 3 months (around 06/01/2024) for chronic follow up.   The patient indicates understanding of these issues and agrees with the plan.  Annabella CHRISTELLA Search, FNP

## 2024-03-02 LAB — MICROALBUMIN / CREATININE URINE RATIO
Creatinine, Urine: 57.7 mg/dL
Microalb/Creat Ratio: 6 mg/g{creat} (ref 0–29)
Microalbumin, Urine: 3.6 ug/mL

## 2024-03-03 ENCOUNTER — Ambulatory Visit (INDEPENDENT_AMBULATORY_CARE_PROVIDER_SITE_OTHER)

## 2024-03-03 ENCOUNTER — Ambulatory Visit: Payer: Self-pay | Admitting: Family Medicine

## 2024-03-03 VITALS — BP 114/80 | HR 82 | Ht 64.0 in | Wt 215.0 lb

## 2024-03-03 DIAGNOSIS — Z0000 Encounter for general adult medical examination without abnormal findings: Secondary | ICD-10-CM | POA: Diagnosis not present

## 2024-03-03 NOTE — Patient Instructions (Signed)
 Tammy Mayo , Thank you for taking time out of your busy schedule to complete your Annual Wellness Visit with me. I enjoyed our conversation and look forward to speaking with you again next year. I, as well as your care team,  appreciate your ongoing commitment to your health goals. Please review the following plan we discussed and let me know if I can assist you in the future. Your Game plan/ To Do List    Referrals: If you haven't heard from the office you've been referred to, please reach out to them at the phone provided.   Follow up Visits: We will see or speak with you next year for your Next Medicare AWV with our clinical staff on 03/03/25 at 9:20a.m. Have you seen your provider in the last 6 months (3 months if uncontrolled diabetes)? Yes  Clinician Recommendations:  Aim for 30 minutes of exercise or brisk walking, 6-8 glasses of water, and 5 servings of fruits and vegetables each day.       This is a list of the screenings recommended for you:  Health Maintenance  Topic Date Due   Medicare Annual Wellness Visit  11/18/2023   COVID-19 Vaccine (4 - 2024-25 season) 09/09/2024*   Flu Shot  10/26/2024*   Hepatitis B Vaccine (1 of 3 - 19+ 3-dose series) 03/01/2025*   HIV Screening  03/01/2025*   Yearly kidney function blood test for diabetes  08/24/2024   Hemoglobin A1C  09/01/2024   Eye exam for diabetics  10/01/2024   Complete foot exam   11/26/2024   Yearly kidney health urinalysis for diabetes  03/01/2025   Mammogram  04/13/2025   Pap with HPV screening  01/25/2026   Colon Cancer Screening  05/02/2026   DTaP/Tdap/Td vaccine (4 - Td or Tdap) 11/18/2026   Pneumococcal Vaccine for high risk medical condition  Completed   Pneumococcal Vaccine for age over 68  Completed   Hepatitis C Screening  Completed   Zoster (Shingles) Vaccine  Completed   HPV Vaccine  Aged Out   Meningitis B Vaccine  Aged Out  *Topic was postponed. The date shown is not the original due date.    Advanced  directives: (Declined) Advance directive discussed with you today. Even though you declined this today, please call our office should you change your mind, and we can give you the proper paperwork for you to fill out. Advance Care Planning is important because it:  [x]  Makes sure you receive the medical care that is consistent with your values, goals, and preferences  [x]  It provides guidance to your family and loved ones and reduces their decisional burden about whether or not they are making the right decisions based on your wishes.  Follow the link provided in your after visit summary or read over the paperwork we have mailed to you to help you started getting your Advance Directives in place. If you need assistance in completing these, please reach out to us  so that we can help you!  See attachments for Preventive Care and Fall Prevention Tips.

## 2024-03-03 NOTE — Progress Notes (Signed)
 Subjective:   Tammy Mayo is a 58 y.o. who presents for a Medicare Wellness preventive visit.  As a reminder, Annual Wellness Visits don't include a physical exam, and some assessments may be limited, especially if this visit is performed virtually. We may recommend an in-person follow-up visit with your provider if needed.  Visit Complete: Virtual I connected with  Tammy Mayo on 03/03/24 by a audio enabled telemedicine application and verified that I am speaking with the correct person using two identifiers.  Patient Location: Home  Provider Location: Home Office  I discussed the limitations of evaluation and management by telemedicine. The patient expressed understanding and agreed to proceed.  Vital Signs: Because this visit was a virtual/telehealth visit, some criteria may be missing or patient reported. Any vitals not documented were not able to be obtained and vitals that have been documented are patient reported.  VideoDeclined- This patient declined Librarian, academic. Therefore the visit was completed with audio only.  Persons Participating in Visit: Patient.  AWV Questionnaire: No: Patient Medicare AWV questionnaire was not completed prior to this visit.  Cardiac Risk Factors include: advanced age (>75men, >23 women);diabetes mellitus;dyslipidemia;hypertension;obesity (BMI >30kg/m2)     Objective:    Today's Vitals   03/03/24 0854  BP: 114/80  Pulse: 82  Weight: 215 lb (97.5 kg)  Height: 5' 4 (1.626 m)   Body mass index is 36.9 kg/m.     03/03/2024    8:58 AM 11/18/2022    2:10 PM 05/30/2020    9:59 AM 05/18/2020    6:25 AM 05/16/2020    9:06 AM 12/15/2019    1:23 PM 07/15/2017    8:22 AM  Advanced Directives  Does Patient Have a Medical Advance Directive? No No No No No No No   Would patient like information on creating a medical advance directive?  No - Patient declined  No - Patient declined No - Patient declined        Data saved with a previous flowsheet row definition    Current Medications (verified) Outpatient Encounter Medications as of 03/03/2024  Medication Sig   ARIPiprazole (ABILIFY) 30 MG tablet Take 30 mg by mouth daily.   Biotin 89999 MCG TABS Take 10,000 mcg by mouth daily.    cholecalciferol (VITAMIN D ) 1000 UNITS tablet Take 1,000 Units by mouth daily.   docusate sodium (COLACE) 100 MG capsule Take 100 mg by mouth daily.   DULoxetine (CYMBALTA) 30 MG capsule Take 30 mg by mouth 2 (two) times daily.   empagliflozin  (JARDIANCE ) 25 MG TABS tablet Take 1 tablet (25 mg total) by mouth daily before breakfast.   fluocinonide  cream (LIDEX ) 0.05 % Apply 1 Application topically 2 (two) times daily.   gabapentin  (NEURONTIN ) 300 MG capsule TAKE ONE (1) CAPSULE BY MOUTH 2 TIMES DAILY   GAVILAX 17 GM/SCOOP powder MIX 17 GRAMS INTO 8OZ OF WATER AND DRINK DAILY   glipiZIDE  (GLUCOTROL ) 5 MG tablet TAKE 1 TABLET BY MOUTH TWICE DAILY BEFORE MEALS   HYDROcodone -acetaminophen  (NORCO/VICODIN) 5-325 MG tablet Take 1 tablet by mouth daily as needed for moderate pain (pain score 4-6).   lisinopril  (ZESTRIL ) 10 MG tablet Take 1 tablet (10 mg total) by mouth daily. TAKE ONE (1) TABLET BY MOUTH EVERY DAY   omeprazole  (PRILOSEC) 20 MG capsule Take 1 capsule (20 mg total) by mouth daily.   rosuvastatin  (CRESTOR ) 10 MG tablet TAKE ONE (1) TABLET EACH DAY   Semaglutide , 2 MG/DOSE, (OZEMPIC , 2 MG/DOSE,) 8  MG/3ML SOPN INJECT 2MG  INTO THE SKIN ONCE A WEEK   TRUEplus Lancets 30G MISC Test BS up to 4 times daily dx E11.40   Vitamin D , Ergocalciferol , (DRISDOL ) 1.25 MG (50000 UNIT) CAPS capsule Take 1 capsule (50,000 Units total) by mouth every 7 (seven) days.   No facility-administered encounter medications on file as of 03/03/2024.    Allergies (verified) Patient has no known allergies.   History: Past Medical History:  Diagnosis Date   Anemia    Arthritis    Chronic back pain    lumbar 4-5 slip and radicular  right leg pain   COVID-19 07/27/2019   Depression    Diabetes mellitus without complication (HCC)    GERD (gastroesophageal reflux disease)    Hyperlipidemia    Hypertension    Neuropathy    Seasonal allergies    Vitamin D  deficiency    Wears glasses    Past Surgical History:  Procedure Laterality Date   ARTHOSCOPIC ROTAOR CUFF REPAIR Right 05/18/2020   Procedure: ARTHROSCOPIC ROTATOR CUFF REPAIR;  Surgeon: Onesimo Oneil LABOR, MD;  Location: [facility];  Service: Orthopedics;  Laterality: Right;   BACK SURGERY  05/2015   lumbar fusion   BICEPT TENODESIS Right 05/18/2020   Procedure: BICEPS TENOTOMY;  Surgeon: Onesimo Oneil LABOR, MD;  Location: [facility];  Service: Orthopedics;  Laterality: Right;   CESAREAN SECTION     x 1   CHOLECYSTECTOMY N/A 11/13/2015   Procedure: LAPAROSCOPIC CHOLECYSTECTOMY;  Surgeon: Donnice Bury, MD;  Location: [facility];  Service: General;  Laterality: N/A;   DILATION AND CURETTAGE OF UTERUS     HERNIA REPAIR     umbilical hernia repair as a child   TUBAL LIGATION     Family History  Problem Relation Age of Onset   Stroke Mother    Hypertension Mother    Diabetes Mother    Hypertension Father    Diabetes Father    Kidney disease Father    Hyperlipidemia Father    Hypertension Sister    Hyperlipidemia Sister    Hypertension Sister    Hypertension Brother    Hypertension Brother    Gout Brother    Social History   Socioeconomic History   Marital status: Married    Spouse name: Geryl   Number of children: 2   Years of education: 12   Highest education level: 12th grade  Occupational History   Not on file  Tobacco Use   Smoking status: Never   Smokeless tobacco: Never  Vaping Use   Vaping status: Never Used  Substance and Sexual Activity   Alcohol use: No   Drug use: No   Sexual activity: Yes    Birth control/protection: None  Other Topics Concern   Not on file  Social History Narrative   Not on file   Social Drivers of Health    Financial Resource Strain: Low Risk  (03/03/2024)   Overall Financial Resource Strain (CARDIA)    Difficulty of Paying Living Expenses: Not hard at all  Food Insecurity: No Food Insecurity (03/03/2024)   Hunger Vital Sign    Worried About Running Out of Food in the Last Year: Never true    Ran Out of Food in the Last Year: Never true  Transportation Needs: No Transportation Needs (02/27/2024)   PRAPARE - Administrator, Civil Service (Medical): No    Lack of Transportation (Non-Medical): No  Physical Activity: Inactive (03/03/2024)   Exercise Vital Sign  Days of Exercise per Week: 0 days    Minutes of Exercise per Session: 0 min  Stress: No Stress Concern Present (03/03/2024)   [employer] of Occupational Health - Occupational Stress Questionnaire    Feeling of Stress: Only a little  Social Connections: Moderately Integrated (03/03/2024)   Social Connection and Isolation Panel    Frequency of Communication with Friends and Family: More than three times a week    Frequency of Social Gatherings with Friends and Family: Never    Attends Religious Services: More than 4 times per year    Active Member of [employer] or Organizations: No    Attends Engineer, structural: Never    Marital Status: Married    Tobacco Counseling Counseling given: Yes    Clinical Intake:  Pre-visit preparation completed: Yes  Pain : No/denies pain     BMI - recorded: 36.9 Nutritional Status: BMI > 30  Obese Nutritional Risks: None Diabetes: Yes CBG done?: No  Lab Results  Component Value Date   HGBA1C 7.1 (H) 03/01/2024   HGBA1C 6.8 (H) 11/27/2023   HGBA1C 6.5 (H) 08/25/2023     How often do you need to have someone help you when you read instructions, pamphlets, or other written materials from your doctor or pharmacy?: 1 - Never  Interpreter Needed?: No  Information entered by :: alia t/cma   Activities of Daily Living     03/03/2024    8:57 AM  In your present state  of health, do you have any difficulty performing the following activities:  Hearing? 0  Vision? 0  Difficulty concentrating or making decisions? 0  Walking or climbing stairs? 0  Dressing or bathing? 0  Doing errands, shopping? 0  Preparing Food and eating ? N  Using the Toilet? N  In the past six months, have you accidently leaked urine? N  Do you have problems with loss of bowel control? N  Managing your Medications? N  Managing your Finances? N  Housekeeping or managing your Housekeeping? N    Patient Care Team: Joesph Annabella HERO, FNP as PCP - General (Family Medicine) Vicci Mcardle, OD (Optometry)  I have updated your Care Teams any recent Medical Services you may have received from other providers in the past year.     Assessment:   This is a routine wellness examination for [place].  Hearing/Vision screen Hearing Screening - Comments:: Pt denies hearing dif Vision Screening - Comments:: Pt wear glasses/pt goes to [facility] in [place]/last ov 2/25   Goals Addressed             This Visit's Progress    DIET - REDUCE SUGAR INTAKE   On track      Depression Screen     03/03/2024    9:01 AM 03/01/2024    1:15 PM 12/30/2023    2:16 PM 11/27/2023    2:31 PM 08/25/2023    1:49 PM 06/10/2023    4:05 PM 05/23/2023    1:00 PM  PHQ 2/9 Scores  PHQ - 2 Score 2 3 2 2 2 2 3   PHQ- 9 Score 9 11 13 8 12 13 10     Fall Risk     03/03/2024    8:55 AM 03/01/2024    1:15 PM 11/27/2023    2:31 PM 08/25/2023    1:49 PM 06/10/2023    4:06 PM  Fall Risk   Falls in the past year? 0 0 0 0 0  Number falls in  past yr: 0    0  Injury with Fall? 0    0  Risk for fall due to : No Fall Risks    No Fall Risks  Follow up Falls evaluation completed    Falls evaluation completed    MEDICARE RISK AT HOME:  Medicare Risk at Home Any stairs in or around the home?: Yes If so, are there any without handrails?: Yes Home free of loose throw rugs in walkways, pet beds, electrical cords, etc?:  Yes Adequate lighting in your home to reduce risk of falls?: Yes Life alert?: No Use of a cane, walker or w/c?: No Grab bars in the bathroom?: Yes Shower chair or bench in shower?: No Elevated toilet seat or a handicapped toilet?: Yes  TIMED UP AND GO:  Was the test performed?  no  Cognitive Function: 6CIT completed    11/18/2022    2:13 PM  MMSE - Mini Mental State Exam  Orientation to time 4  Orientation to Place 5  Registration 3  Attention/ Calculation 4  Recall 3  Language- name 2 objects 2  Language- repeat 1  Language- follow 3 step command 3  Language- read & follow direction 1  Write a sentence 1  Copy design 0  Total score 27        03/03/2024    8:58 AM  6CIT Screen  What Year? 0 points  What month? 0 points  What time? 0 points  Count back from 20 0 points  Months in reverse 0 points  Repeat phrase 0 points  Total Score 0 points    Immunizations Immunization History  Administered Date(s) Administered   Influenza, Seasonal, Injecte, Preservative Fre 05/13/2023   Influenza,inj,Quad PF,6+ Mos 05/19/2014, 05/05/2020, 05/11/2021, 06/18/2022   Moderna Sars-Covid-2 Vaccination 10/13/2019, 11/08/2019, 06/27/2020   PNEUMOCOCCAL CONJUGATE-20 08/09/2021   Pneumococcal Polysaccharide-23 07/25/2020   Td 03/14/2007   Tdap 03/14/2007, 11/17/2016   Zoster Recombinant(Shingrix ) 05/11/2021, 09/20/2021    Screening Tests Health Maintenance  Topic Date Due   COVID-19 Vaccine (4 - 2024-25 season) 09/09/2024 (Originally 03/30/2023)   INFLUENZA VACCINE  10/26/2024 (Originally 02/27/2024)   Hepatitis B Vaccines (1 of 3 - 19+ 3-dose series) 03/01/2025 (Originally 01/12/1986)   HIV Screening  03/01/2025 (Originally 01/12/1982)   Diabetic kidney evaluation - eGFR measurement  08/24/2024   HEMOGLOBIN A1C  09/01/2024   OPHTHALMOLOGY EXAM  10/01/2024   FOOT EXAM  11/26/2024   Diabetic kidney evaluation - Urine ACR  03/01/2025   Medicare Annual Wellness (AWV)  03/03/2025    MAMMOGRAM  04/13/2025   Cervical Cancer Screening (HPV/Pap Cotest)  01/25/2026   Colonoscopy  05/02/2026   DTaP/Tdap/Td (4 - Td or Tdap) 11/18/2026   Pneumococcal Vaccine: 19-49 Years  Completed   Pneumococcal Vaccine: 50+ Years  Completed   Hepatitis C Screening  Completed   Zoster Vaccines- Shingrix   Completed   HPV VACCINES  Aged Out   Meningococcal B Vaccine  Aged Out    Health Maintenance  There are no preventive care reminders to display for this patient.  Health Maintenance Items Addressed: See Nurse Notes at the end of this note  Additional Screening:  Vision Screening: Recommended annual ophthalmology exams for early detection of glaucoma and other disorders of the eye. Would you like a referral to an eye doctor? No    Dental Screening: Recommended annual dental exams for proper oral hygiene  Community Resource Referral / Chronic Care Management: CRR required this visit?  No   CCM required  this visit?  No   Plan:    I have personally reviewed and noted the following in the patient's chart:   Medical and social history Use of alcohol, tobacco or illicit drugs  Current medications and supplements including opioid prescriptions. Patient is not currently taking opioid prescriptions. Functional ability and status Nutritional status Physical activity Advanced directives List of other physicians Hospitalizations, surgeries, and ER visits in previous 12 months Vitals Screenings to include cognitive, depression, and falls Referrals and appointments  In addition, I have reviewed and discussed with patient certain preventive protocols, quality metrics, and best practice recommendations. A written personalized care plan for preventive services as well as general preventive health recommendations were provided to patient.   Ozie Ned, CMA   03/03/2024   After Visit Summary: (MyChart) Due to this being a telephonic visit, the after visit summary with patients  personalized plan was offered to patient via MyChart   Notes: Please refer to Routing Comments.

## 2024-03-12 ENCOUNTER — Telehealth: Payer: Self-pay | Admitting: Family Medicine

## 2024-03-12 DIAGNOSIS — Z0279 Encounter for issue of other medical certificate: Secondary | ICD-10-CM

## 2024-03-12 NOTE — Telephone Encounter (Signed)
 pt dropped off handicap forms to be completed and signed.  Form Fee Paid? (Y/N)       yes     If NO, form is placed on front office manager desk to hold until payment received. If YES, then form will be placed in the RX/HH Nurse Coordinators box for completion.  Form will not be processed until payment is received

## 2024-03-16 NOTE — Telephone Encounter (Signed)
 Aware handicap form ready

## 2024-04-14 LAB — HM MAMMOGRAPHY

## 2024-04-21 ENCOUNTER — Other Ambulatory Visit: Payer: Self-pay | Admitting: Family Medicine

## 2024-04-21 DIAGNOSIS — E1140 Type 2 diabetes mellitus with diabetic neuropathy, unspecified: Secondary | ICD-10-CM

## 2024-05-11 ENCOUNTER — Other Ambulatory Visit: Payer: Self-pay | Admitting: Family Medicine

## 2024-05-11 DIAGNOSIS — E1140 Type 2 diabetes mellitus with diabetic neuropathy, unspecified: Secondary | ICD-10-CM

## 2024-05-11 DIAGNOSIS — E1159 Type 2 diabetes mellitus with other circulatory complications: Secondary | ICD-10-CM

## 2024-06-02 ENCOUNTER — Ambulatory Visit: Payer: Self-pay | Admitting: Family Medicine

## 2024-06-07 ENCOUNTER — Ambulatory Visit: Admitting: Family Medicine

## 2024-06-07 ENCOUNTER — Telehealth: Payer: Self-pay | Admitting: Pharmacy Technician

## 2024-06-07 ENCOUNTER — Other Ambulatory Visit (HOSPITAL_COMMUNITY): Payer: Self-pay

## 2024-06-07 ENCOUNTER — Encounter: Payer: Self-pay | Admitting: Family Medicine

## 2024-06-07 VITALS — BP 114/71 | HR 87 | Temp 98.6°F | Ht 64.0 in | Wt 218.0 lb

## 2024-06-07 DIAGNOSIS — Z0001 Encounter for general adult medical examination with abnormal findings: Secondary | ICD-10-CM

## 2024-06-07 DIAGNOSIS — M545 Low back pain, unspecified: Secondary | ICD-10-CM

## 2024-06-07 DIAGNOSIS — Z23 Encounter for immunization: Secondary | ICD-10-CM | POA: Diagnosis not present

## 2024-06-07 DIAGNOSIS — I152 Hypertension secondary to endocrine disorders: Secondary | ICD-10-CM

## 2024-06-07 DIAGNOSIS — E1169 Type 2 diabetes mellitus with other specified complication: Secondary | ICD-10-CM

## 2024-06-07 DIAGNOSIS — R251 Tremor, unspecified: Secondary | ICD-10-CM

## 2024-06-07 DIAGNOSIS — F419 Anxiety disorder, unspecified: Secondary | ICD-10-CM

## 2024-06-07 DIAGNOSIS — R259 Unspecified abnormal involuntary movements: Secondary | ICD-10-CM

## 2024-06-07 DIAGNOSIS — E1159 Type 2 diabetes mellitus with other circulatory complications: Secondary | ICD-10-CM

## 2024-06-07 DIAGNOSIS — Z79899 Other long term (current) drug therapy: Secondary | ICD-10-CM

## 2024-06-07 DIAGNOSIS — E1165 Type 2 diabetes mellitus with hyperglycemia: Secondary | ICD-10-CM | POA: Diagnosis not present

## 2024-06-07 DIAGNOSIS — F331 Major depressive disorder, recurrent, moderate: Secondary | ICD-10-CM

## 2024-06-07 DIAGNOSIS — Z7985 Long-term (current) use of injectable non-insulin antidiabetic drugs: Secondary | ICD-10-CM

## 2024-06-07 DIAGNOSIS — Z0000 Encounter for general adult medical examination without abnormal findings: Secondary | ICD-10-CM

## 2024-06-07 LAB — BAYER DCA HB A1C WAIVED: HB A1C (BAYER DCA - WAIVED): 6.7 % — ABNORMAL HIGH (ref 4.8–5.6)

## 2024-06-07 MED ORDER — HYDROCODONE-ACETAMINOPHEN 5-325 MG PO TABS: 1.0000 | ORAL_TABLET | Freq: Every day | ORAL | 0 refills | Status: AC | PRN

## 2024-06-07 MED ORDER — LISINOPRIL 10 MG PO TABS: 10.0000 mg | ORAL_TABLET | Freq: Every day | ORAL | 3 refills | Status: AC

## 2024-06-07 NOTE — Telephone Encounter (Signed)
 Approved.

## 2024-06-07 NOTE — Telephone Encounter (Signed)
 Pharmacy Patient Advocate Encounter   Received notification from Onbase that prior authorization for HYDROcodone -Acetaminophen  5-325MG  tablets is required/requested.   Insurance verification completed.   The patient is insured through [facility].   Per test claim: PA required; PA started via CoverMyMeds. KEY [id number] . Waiting for clinical questions to populate.

## 2024-06-07 NOTE — Progress Notes (Signed)
 Complete physical exam  Patient: Tammy Mayo   DOB: [birth year]   58 y.o. Female  MRN: [id number]  Subjective:    Chief Complaint  Patient presents with   Medical Management of Chronic Issues    Tammy Mayo is a 58 y.o. female who presents today for a complete physical exam. She reports consuming a general diet. The patient does not participate in regular exercise at present. She generally feels fairly well. She reports sleeping fairly well. She does have additional problems to discuss today.   Glycemic control - Diabetes managed with Ozempic  2 mg weekly, glipizide , and Jardiance  - Recent hemoglobin A1c is 6.7% - Morning blood glucose ranges from 90 to 120 mg/dL - Acknowledges need for dietary improvement, especially with holidays approaching - No engagement in regular physical activity  Involuntary movements - Involuntary movements of the lips, first noticed by her husband - Symptoms have been present for approximately one and a half months - Has not informed behavioral health provider about this symptom - Mild tremor in the hands, onset approximately one and a half months ago - No other new symptoms  Mood disturbance and suicidal ideation - Following up with behavioral health and feels 'okay' - Admits to feeling she would be 'better off not being here' - Denies thought of self-harm or any plan or history of suicide attempts - Occasionally discusses these feelings with her husband      Most recent fall risk assessment:    06/07/2024    2:02 PM  Fall Risk   Falls in the past year? 0  Number falls in past yr: 0  Risk for fall due to : No Fall Risks  Follow up Falls evaluation completed     Most recent depression screenings:    06/07/2024    2:02 PM 03/03/2024    9:01 AM 03/01/2024    1:15 PM  Depression screen PHQ 2/9  Decreased Interest 1 1 2   Down, Depressed, Hopeless 1 1 1   PHQ - 2 Score 2 2 3   Altered sleeping 1 1 3   Tired, decreased energy 3 3 2    Change in appetite 1 1 2   Feeling bad or failure about yourself  0 1 1  Trouble concentrating 0 0 0  Moving slowly or fidgety/restless 0 0 0  Suicidal thoughts 2 1 0  PHQ-9 Score 9 9  11    Difficult doing work/chores Very difficult Not difficult at all Somewhat difficult     Data saved with a previous flowsheet row definition      06/07/2024    2:04 PM 03/01/2024    1:16 PM 11/27/2023    2:32 PM 08/25/2023    1:50 PM  GAD 7 : Generalized Anxiety Score  Nervous, Anxious, on Edge 1 1 1 1   Control/stop worrying 1 1 1  0  Worry too much - different things 1 1 1 1   Trouble relaxing 0 1 1 1   Restless 1 0 1 1  Easily annoyed or irritable 0 0 1 2  Afraid - awful might happen 1 1 1 2   Total GAD 7 Score 5 5 7 8   Anxiety Difficulty  Somewhat difficult Somewhat difficult Somewhat difficult     Vision:Within last year    Patient Care Team: Joesph Annabella HERO, FNP as PCP - General (Family Medicine) Vicci Mcardle, OD [facility])   Outpatient Medications Prior to Visit  Medication Sig   ARIPiprazole (ABILIFY) 30 MG tablet Take 30 mg by mouth daily.  Biotin 89999 MCG TABS Take 10,000 mcg by mouth daily.    cholecalciferol (VITAMIN D ) 1000 UNITS tablet Take 1,000 Units by mouth daily.   docusate sodium (COLACE) 100 MG capsule Take 100 mg by mouth daily.   DULoxetine (CYMBALTA) 30 MG capsule Take 30 mg by mouth 2 (two) times daily.   empagliflozin  (JARDIANCE ) 25 MG TABS tablet TAKE 1 TABLET BY MOUTH EVERY MORNING BEFORE BREAKFAST   fluocinonide  cream (LIDEX ) 0.05 % Apply 1 Application topically 2 (two) times daily.   gabapentin  (NEURONTIN ) 300 MG capsule TAKE ONE (1) CAPSULE BY MOUTH 2 TIMES DAILY   GAVILAX 17 GM/SCOOP powder MIX 17 GRAMS INTO 8OZ OF WATER AND DRINK DAILY   glipiZIDE  (GLUCOTROL ) 5 MG tablet TAKE 1 TABLET BY MOUTH TWICE DAILY BEFORE MEALS   HYDROcodone -acetaminophen  (NORCO/VICODIN) 5-325 MG tablet Take 1 tablet by mouth daily as needed for moderate pain (pain score 4-6).    lisinopril  (ZESTRIL ) 10 MG tablet TAKE ONE (1) TABLET BY MOUTH EVERY DAY   omeprazole  (PRILOSEC) 20 MG capsule Take 1 capsule (20 mg total) by mouth daily.   rosuvastatin  (CRESTOR ) 10 MG tablet TAKE ONE (1) TABLET EACH DAY   Semaglutide , 2 MG/DOSE, (OZEMPIC , 2 MG/DOSE,) 8 MG/3ML SOPN INJECT 2MG  INTO THE SKIN ONCE A WEEK   TRUEplus Lancets 30G MISC Test BS up to 4 times daily dx E11.40   [DISCONTINUED] Vitamin D , Ergocalciferol , (DRISDOL ) 1.25 MG (50000 UNIT) CAPS capsule Take 1 capsule (50,000 Units total) by mouth every 7 (seven) days.   No facility-administered medications prior to visit.    ROS As per HPI.        Objective:     BP 114/71   Pulse 87   Temp 98.6 F (37 C)   Ht 5' 4 (1.626 m)   Wt 218 lb (98.9 kg)   LMP  (LMP Unknown)   SpO2 98%   BMI 37.42 kg/m    Physical Exam Vitals and nursing note reviewed.  Constitutional:      General: She is not in acute distress.    Appearance: She is obese. She is not ill-appearing, toxic-appearing or diaphoretic.  HENT:     Head: Normocephalic.     Right Ear: Tympanic membrane, ear canal and external ear normal.     Left Ear: Tympanic membrane, ear canal and external ear normal.     Nose: Nose normal.     Mouth/Throat:     Mouth: Mucous membranes are moist.     Pharynx: Oropharynx is clear.  Eyes:     Extraocular Movements: Extraocular movements intact.     Conjunctiva/sclera: Conjunctivae normal.     Pupils: Pupils are equal, round, and reactive to light.  Neck:     Thyroid: No thyroid mass, thyromegaly or thyroid tenderness.  Cardiovascular:     Rate and Rhythm: Normal rate and regular rhythm.     Pulses: Normal pulses.     Heart sounds: Normal heart sounds. No murmur heard.    No friction rub. No gallop.  Pulmonary:     Effort: Pulmonary effort is normal.     Breath sounds: Normal breath sounds.  Abdominal:     General: Bowel sounds are normal. There is no distension.     Palpations: Abdomen is soft. There is  no mass.     Tenderness: There is no abdominal tenderness. There is no guarding.  Musculoskeletal:        General: No tenderness.     Cervical back: Normal range  of motion and neck supple. No tenderness.     Right lower leg: No edema.     Left lower leg: No edema.  Skin:    General: Skin is warm and dry.     Capillary Refill: Capillary refill takes less than 2 seconds.     Findings: No lesion or rash.  Neurological:     General: No focal deficit present.     Mental Status: She is alert and oriented to person, place, and time.     Cranial Nerves: No cranial nerve deficit.     Motor: Tremor (bilateral hands. No tremor at rest) present. No weakness.     Coordination: Coordination is intact.     Gait: Gait normal.  Psychiatric:        Mood and Affect: Mood normal.        Behavior: Behavior normal.        Thought Content: Thought content normal.        Judgment: Judgment normal.      No results found for any visits on 06/07/24.     Assessment & Plan:    Routine Health Maintenance and Physical Exam  Tammy Mayo was seen today for medical management of chronic issues and annual exam.  Diagnoses and all orders for this visit:  Routine general medical examination at a health care facility  Type 2 diabetes mellitus with hyperglycemia, without long-term current use of insulin  (HCC) -     Bayer DCA Hb A1c Waived -     CMP14+EGFR  Hyperlipidemia associated with type 2 diabetes mellitus (HCC) -     Lipid panel  Hypertension associated with type 2 diabetes mellitus (HCC) -     CBC with Differential/Platelet -     lisinopril  (ZESTRIL ) 10 MG tablet; Take 1 tablet (10 mg total) by mouth daily.  Morbid obesity (HCC)  Recurrent moderate major depressive disorder with anxiety (HCC)  Abnormal involuntary movements  Tremor of both hands  Chronic bilateral low back pain without sciatica -     HYDROcodone -acetaminophen  (NORCO/VICODIN) 5-325 MG tablet; Take 1 tablet by mouth daily as  needed for moderate pain (pain score 4-6).  Controlled substance agreement signed -     HYDROcodone -acetaminophen  (NORCO/VICODIN) 5-325 MG tablet; Take 1 tablet by mouth daily as needed for moderate pain (pain score 4-6).  Need for immunization against influenza   Assessment and Plan    Adult Wellness Visit Routine visit with well-controlled blood pressure and blood glucose. A1c is 6.7%. - Administered flu shot. - Scheduled follow-up in 3 months.  Type 2 diabetes mellitus Well-controlled with A1c at 6.7% and blood glucose levels between 90s to 120s. - Continue Ozempic  2 mg weekly. - Continue glipizide  and Jardiance .  Morbid obesity due to excess calories Diet needs improvement, no current exercise routine. - Encouraged dietary improvements.  Depression Intermittent thoughts of self-harm without plan or intent. Continues behavioral health follow-up. - Continue follow-up with behavioral health. - Encouraged communication with husband about feelings.  Abnormal involuntary movements, lips and hands (possible medication side effect) Involuntary movements possibly due to medication side effects. - Contact behavioral health provider to discuss involuntary movements.  Chronic back pain Stable. UDS and CSA are UTD.  - PDMP reviewed, no red flags.  - Taking norco sparingly with 30 tablets lasting 90 days.  - Refilled pain medication.       Immunization History  Administered Date(s) Administered   Influenza, Seasonal, Injecte, Preservative Fre 05/13/2023   Influenza,inj,Quad PF,6+ Mos 05/19/2014, 05/05/2020, 05/11/2021,  06/18/2022   Moderna Sars-Covid-2 Vaccination 10/13/2019, 11/08/2019, 06/27/2020   PNEUMOCOCCAL CONJUGATE-20 08/09/2021   Pneumococcal Polysaccharide-23 07/25/2020   Td 03/14/2007   Tdap 03/14/2007, 11/17/2016   Zoster Recombinant(Shingrix ) 05/11/2021, 09/20/2021    Health Maintenance  Topic Date Due   COVID-19 Vaccine (4 - 2025-26 season) 03/29/2024    Influenza Vaccine  10/26/2024 (Originally 02/27/2024)   Hepatitis B Vaccines 19-59 Average Risk (1 of 3 - 19+ 3-dose series) 03/01/2025 (Originally 01/12/1986)   HIV Screening  03/01/2025 (Originally 01/12/1982)   Diabetic kidney evaluation - eGFR measurement  08/24/2024   HEMOGLOBIN A1C  09/01/2024   OPHTHALMOLOGY EXAM  10/01/2024   FOOT EXAM  11/26/2024   Diabetic kidney evaluation - Urine ACR  03/01/2025   Medicare Annual Wellness (AWV)  03/03/2025   Cervical Cancer Screening (HPV/Pap Cotest)  01/25/2026   Mammogram  04/14/2026   Colonoscopy  05/02/2026   DTaP/Tdap/Td (4 - Td or Tdap) 11/18/2026   Pneumococcal Vaccine: 50+ Years  Completed   Hepatitis C Screening  Completed   Zoster Vaccines- Shingrix   Completed   HPV VACCINES  Aged Out   Meningococcal B Vaccine  Aged Out    Discussed health benefits of physical activity, and encouraged her to engage in regular exercise appropriate for her age and condition.  Problem List Items Addressed This Visit       Cardiovascular and Mediastinum   Hypertension associated with type 2 diabetes mellitus (HCC)   Relevant Medications   lisinopril  (ZESTRIL ) 10 MG tablet   Other Relevant Orders   CBC with Differential/Platelet     Endocrine   Hyperlipidemia associated with type 2 diabetes mellitus (HCC)   Relevant Medications   lisinopril  (ZESTRIL ) 10 MG tablet   Other Relevant Orders   Lipid panel   Type 2 diabetes mellitus with hyperglycemia (HCC)   Relevant Medications   lisinopril  (ZESTRIL ) 10 MG tablet   Other Relevant Orders   Bayer DCA Hb A1c Waived   CMP14+EGFR     Other   Chronic bilateral low back pain without sciatica   Relevant Medications   HYDROcodone -acetaminophen  (NORCO/VICODIN) 5-325 MG tablet   Controlled substance agreement signed   Relevant Medications   HYDROcodone -acetaminophen  (NORCO/VICODIN) 5-325 MG tablet   Recurrent moderate major depressive disorder with anxiety (HCC)   Morbid obesity (HCC)   Other  Visit Diagnoses       Routine general medical examination at a health care facility    -  Primary     Abnormal involuntary movements         Tremor of both hands          Return in about 3 months (around 09/07/2024) for chronic follow up.   The patient indicates understanding of these issues and agrees with the plan.  Annabella CHRISTELLA Search, FNP

## 2024-06-07 NOTE — Telephone Encounter (Signed)
 Pharmacy Patient Advocate Encounter  Received notification from [facility] that Prior Authorization for HYDROcodone -Acetaminophen  5-325MG  tablets has been APPROVED from 06/07/24 to 07/07/24. Ran test claim, Copay is $10.00. This test claim was processed through [facility]- copay amounts may vary at other pharmacies due to pharmacy/plan contracts, or as the patient moves through the different stages of their insurance plan.   PA #/Case ID/Reference #: [id number]

## 2024-06-07 NOTE — Patient Instructions (Signed)

## 2024-06-08 LAB — CBC WITH DIFFERENTIAL/PLATELET
Basophils Absolute: 0 x10E3/uL (ref 0.0–0.2)
Basos: 1 %
EOS (ABSOLUTE): 0.1 x10E3/uL (ref 0.0–0.4)
Eos: 1 %
Hematocrit: 43.2 % (ref 34.0–46.6)
Hemoglobin: 13.7 g/dL (ref 11.1–15.9)
Immature Grans (Abs): 0 x10E3/uL (ref 0.0–0.1)
Immature Granulocytes: 0 %
Lymphocytes Absolute: 2.5 x10E3/uL (ref 0.7–3.1)
Lymphs: 43 %
MCH: 29.5 pg (ref 26.6–33.0)
MCHC: 31.7 g/dL (ref 31.5–35.7)
MCV: 93 fL (ref 79–97)
Monocytes Absolute: 0.4 x10E3/uL (ref 0.1–0.9)
Monocytes: 8 %
Neutrophils Absolute: 2.8 x10E3/uL (ref 1.4–7.0)
Neutrophils: 47 %
Platelets: 256 x10E3/uL (ref 150–450)
RBC: 4.65 x10E6/uL (ref 3.77–5.28)
RDW: 13.3 % (ref 11.7–15.4)
WBC: 5.9 x10E3/uL (ref 3.4–10.8)

## 2024-06-08 LAB — LIPID PANEL
Chol/HDL Ratio: 2.6 ratio (ref 0.0–4.4)
Cholesterol, Total: 126 mg/dL (ref 100–199)
HDL: 49 mg/dL (ref >39)
LDL Chol Calc (NIH): 63 mg/dL (ref 0–99)
Triglycerides: 67 mg/dL (ref 0–149)
VLDL Cholesterol Cal: 14 mg/dL (ref 5–40)

## 2024-06-08 LAB — CMP14+EGFR
ALT: 16 IU/L (ref 0–32)
AST: 17 IU/L (ref 0–40)
Albumin: 4.2 g/dL (ref 3.8–4.9)
Alkaline Phosphatase: 116 IU/L (ref 49–135)
BUN/Creatinine Ratio: 8 — ABNORMAL LOW (ref 9–23)
BUN: 7 mg/dL (ref 6–24)
Bilirubin Total: 0.5 mg/dL (ref 0.0–1.2)
CO2: 25 mmol/L (ref 20–29)
Calcium: 9.6 mg/dL (ref 8.7–10.2)
Chloride: 109 mmol/L — ABNORMAL HIGH (ref 96–106)
Creatinine, Ser: 0.88 mg/dL (ref 0.57–1.00)
Globulin, Total: 2.4 g/dL (ref 1.5–4.5)
Glucose: 55 mg/dL — ABNORMAL LOW (ref 70–99)
Potassium: 3.7 mmol/L (ref 3.5–5.2)
Sodium: 147 mmol/L — ABNORMAL HIGH (ref 134–144)
Total Protein: 6.6 g/dL (ref 6.0–8.5)
eGFR: 77 mL/min/1.73 (ref >59)

## 2024-06-09 ENCOUNTER — Ambulatory Visit: Payer: Self-pay | Admitting: Family Medicine

## 2024-06-29 ENCOUNTER — Encounter: Payer: Self-pay | Admitting: *Deleted

## 2024-07-15 ENCOUNTER — Other Ambulatory Visit: Payer: Self-pay | Admitting: Family Medicine

## 2024-07-15 DIAGNOSIS — E1140 Type 2 diabetes mellitus with diabetic neuropathy, unspecified: Secondary | ICD-10-CM

## 2024-08-10 ENCOUNTER — Other Ambulatory Visit: Payer: Self-pay | Admitting: Family Medicine

## 2024-08-10 DIAGNOSIS — E1165 Type 2 diabetes mellitus with hyperglycemia: Secondary | ICD-10-CM

## 2024-08-10 DIAGNOSIS — E1140 Type 2 diabetes mellitus with diabetic neuropathy, unspecified: Secondary | ICD-10-CM

## 2024-09-08 ENCOUNTER — Ambulatory Visit: Admitting: Family Medicine

## 2024-09-08 ENCOUNTER — Ambulatory Visit

## 2025-03-04 ENCOUNTER — Ambulatory Visit: Payer: Self-pay
